# Patient Record
Sex: Male | Born: 1935 | Race: Black or African American | Hispanic: No | Smoking: Never smoker
Health system: Southern US, Community
[De-identification: ages and names within clinical notes are randomized; demographics above are authoritative.]

## PROBLEM LIST (undated history)

## (undated) DIAGNOSIS — I10 Essential (primary) hypertension: Secondary | ICD-10-CM

## (undated) DIAGNOSIS — I2510 Atherosclerotic heart disease of native coronary artery without angina pectoris: Secondary | ICD-10-CM

## (undated) DIAGNOSIS — E785 Hyperlipidemia, unspecified: Secondary | ICD-10-CM

## (undated) HISTORY — PX: OTHER SURGICAL HISTORY: SHX169

## (undated) HISTORY — PX: TONSILLECTOMY: SUR1361

## (undated) HISTORY — PX: CORONARY ANGIOPLASTY: SHX604

## (undated) HISTORY — PX: CARDIAC CATHETERIZATION: SHX172

---

## 2004-03-17 ENCOUNTER — Ambulatory Visit (HOSPITAL_COMMUNITY): Admission: RE | Admit: 2004-03-17 | Discharge: 2004-03-17 | Payer: Self-pay | Admitting: Family Medicine

## 2005-04-21 ENCOUNTER — Emergency Department (HOSPITAL_COMMUNITY): Admission: EM | Admit: 2005-04-21 | Discharge: 2005-04-21 | Payer: Self-pay | Admitting: Emergency Medicine

## 2007-10-02 ENCOUNTER — Ambulatory Visit (HOSPITAL_COMMUNITY): Admission: RE | Admit: 2007-10-02 | Discharge: 2007-10-02 | Payer: Self-pay | Admitting: General Surgery

## 2007-10-02 ENCOUNTER — Encounter (INDEPENDENT_AMBULATORY_CARE_PROVIDER_SITE_OTHER): Payer: Self-pay | Admitting: General Surgery

## 2007-10-03 ENCOUNTER — Inpatient Hospital Stay (HOSPITAL_COMMUNITY): Admission: RE | Admit: 2007-10-03 | Discharge: 2007-10-07 | Payer: Self-pay | Admitting: General Surgery

## 2007-10-03 ENCOUNTER — Encounter (INDEPENDENT_AMBULATORY_CARE_PROVIDER_SITE_OTHER): Payer: Self-pay | Admitting: General Surgery

## 2007-10-18 ENCOUNTER — Inpatient Hospital Stay (HOSPITAL_COMMUNITY): Admission: EM | Admit: 2007-10-18 | Discharge: 2007-10-23 | Payer: Self-pay | Admitting: Emergency Medicine

## 2008-02-13 ENCOUNTER — Emergency Department (HOSPITAL_COMMUNITY): Admission: EM | Admit: 2008-02-13 | Discharge: 2008-02-13 | Payer: Self-pay | Admitting: Emergency Medicine

## 2010-04-01 ENCOUNTER — Emergency Department (HOSPITAL_COMMUNITY): Admission: EM | Admit: 2010-04-01 | Discharge: 2010-04-01 | Payer: Self-pay | Admitting: Emergency Medicine

## 2010-08-03 ENCOUNTER — Emergency Department (HOSPITAL_COMMUNITY)
Admission: EM | Admit: 2010-08-03 | Discharge: 2010-08-03 | Payer: Self-pay | Source: Home / Self Care | Admitting: Emergency Medicine

## 2010-08-03 ENCOUNTER — Emergency Department (HOSPITAL_COMMUNITY)
Admission: EM | Admit: 2010-08-03 | Discharge: 2010-08-04 | Payer: Self-pay | Source: Home / Self Care | Admitting: Emergency Medicine

## 2010-08-04 LAB — URINALYSIS, ROUTINE W REFLEX MICROSCOPIC
Ketones, ur: NEGATIVE mg/dL
Leukocytes, UA: NEGATIVE
Nitrite: NEGATIVE
Protein, ur: 30 mg/dL — AB
Urobilinogen, UA: 0.2 mg/dL (ref 0.0–1.0)
pH: 6 (ref 5.0–8.0)

## 2010-08-04 LAB — BASIC METABOLIC PANEL
BUN: 29 mg/dL — ABNORMAL HIGH (ref 6–23)
Calcium: 9.1 mg/dL (ref 8.4–10.5)
GFR calc non Af Amer: 39 mL/min — ABNORMAL LOW (ref >60)
Potassium: 3.4 mEq/L — ABNORMAL LOW (ref 3.5–5.1)

## 2010-08-04 LAB — CBC
MCHC: 36.5 g/dL — ABNORMAL HIGH (ref 30.0–36.0)
MCV: 88.9 fL (ref 78.0–100.0)
Platelets: 159 10*3/uL (ref 150–400)
RDW: 13.8 % (ref 11.5–15.5)
WBC: 5.5 10*3/uL (ref 4.0–10.5)

## 2010-11-23 NOTE — H&P (Signed)
NAME:  Samuel Wilkerson, Samuel Wilkerson                ACCOUNT NO.:  [id number]   MEDICAL RECORD NO.:  [id number]           PATIENT TYPE:  AMB   LOCATION:  DAY                           FACILITY:  [facility]   PHYSICIAN:  Dalia Heading, M.D.  DATE OF BIRTH:  Apr 21, 1931   DATE OF ADMISSION:  DATE OF DISCHARGE:  LH                              HISTORY & PHYSICAL   CHIEF COMPLAINT:  Need for screening colonoscopy.   HISTORY OF PRESENT ILLNESS:  Patient is a 77 year old black male who was  referred for endoscopic evaluation.  He needs a colonoscopy for  screening purposes.  No abdominal pain, weight loss, nausea, vomiting,  diarrhea, constipation, melena, hematochezia.  He has never had a  colonoscopy.  There is no family history of colon carcinoma.   PAST MEDICAL HISTORY:  Includes:  1. Hypertension.  2. High cholesterol levels.   PAST SURGICAL HISTORY:  Unremarkable.   CURRENT MEDICATIONS:  1. Zetia.  2. Lisinopril.  3. Nifedipine.  4. Potassium supplements.  5. Librium.  6. Metoprolol.  7. Hydrochlorothiazide.  8. Zocor.  9. Aspirin which he is holding.   ALLERGIES:  NO KNOWN DRUG ALLERGIES.   REVIEW OF SYSTEMS:  Noncontributory.   PHYSICAL EXAMINATION:  Patient is a well-developed, well-nourished black  male in no acute distress.  LUNGS:  Clear to auscultation with equal breath sounds bilaterally.  HEART:  Examination reveals a regular rate and rhythm without S3, S4, or  murmurs.  ABDOMEN:  Soft, nontender, nondistended.  No hepatosplenomegaly or  masses are noted.  RECTAL:  Examination was deferred to the procedure.   IMPRESSION:  Need for screening colonoscopy.   PLAN:  The patient is scheduled for a colonoscopy on 10/02/2007.  The  risks and benefits of the procedure including bleeding and perforation  were fully explained to the patient, gave informed consent.      Dalia Heading, M.D.  Electronically Signed     MAJ/MEDQ  D:  09/27/2007  T:  09/27/2007  Job:  [id number]   cc:    Dalia Heading, M.D.  Fax: [phone]   Mila Homer. Sudie Bailey, M.D.  Fax: [phone]

## 2010-11-23 NOTE — H&P (Signed)
NAME:  Samuel Wilkerson, Samuel Wilkerson                ACCOUNT NO.:  [id number]   MEDICAL RECORD NO.:  [id number]          PATIENT TYPE:  INP   LOCATION:  A219                          FACILITY:  [facility]   PHYSICIAN:  Tilford Pillar, MD      DATE OF BIRTH:  02-24-1931   DATE OF ADMISSION:  10/18/2007  DATE OF DISCHARGE:  LH                              HISTORY & PHYSICAL   CHIEF COMPLAINT:  Bloody bowel movements, abdominal pain, and diarrhea.   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  Lovell Sheehan, who presented to the [facility] Emergency Department with one  episode of bright red blood per rectum.  The patient states that over  the last 1-2 weeks he had noted increasing loose stools.  Over the last  few days, he noticed frank diarrhea with watery bowel movements with  limited solid material.  This had continued on multiple episodes until  last night when he noticed bright red blood in the toilet.  He had no  nausea or vomiting.  No fevers or chills.  No acute abdominal pain.  He  did say he felt somewhat lightheaded upon sitting on the commode, but  did not lose consciousness and did not have any additional sensations of  vertigo or lightheadedness.  He denies any shortness of breath.  He  denied any chest pain.   PAST MEDICAL HISTORY:  1. Hypertension.  2. Hyperlipidemia.   PAST SURGICAL HISTORY:  Right hemicolectomy (postop 2 weeks).   MEDICATIONS:  1. Darvocet-N 100.  2. Zetia.  3. Lisinopril.  4. Nifedipine.  5. K-Dur.  6. Librium.  7. Metoprolol.  8. Hydrochlorothiazide.  9. Aspirin.  10.Zocor.   ALLERGIES:  NO KNOWN DRUG ALLERGIES.   SOCIAL HISTORY:  No tobacco use.  No alcohol use.  No recreational drug  use.   PHYSICAL EXAMINATION:  VITAL SIGNS:  Temperature 98.1, heart rate 80,  respirations 16, blood pressure 92/66.  He  has 96% oxygen saturation on  room air.  GENERAL:  Currently the patient is sitting in an upright position in the  hospital bed.  He is not in any acute distress.  He is alert and  oriented x3.  HEENT:  Pupils are equal, round, and reactive.  Pupils are somewhat  dilated, but symmetrical bilaterally.  Extraocular movements are intact.  Oral mucosa pink and moist.  Trachea is midline.  No cervical  lymphadenopathy is appreciated.  PULMONARY:  Unlabored respirations.  He is clear to auscultation  bilaterally.  CARDIOVASCULAR:  Regular rate and rhythm.  No murmurs appreciated on  evaluation.  He has 2+ radial pulse and dorsalis pedis pulses  bilaterally.  ABDOMEN:  Soft, flat, nondistended, and nontender.  He has a couple of  Steri-Strips remaining on his abdominal wall.  His incisions are well  healed.  No erythema and no masses are appreciated.   PERTINENT LABORATORY AND RADIOGRAPHIC STUDIES:  CBC:  White blood cell  count of 11.5, hemoglobin 9.6, hematocrit 27.6, and platelets 507.  Basic metabolic panel sodium 131, potassium 3.8, chloride 99, bicarb 27,  BUN 27, creatinine 1.77, blood glucose 142.  An acute abdominal series  ordered in [facility] Emergency Department which demonstrated no  evidence of free air, had a nonspecific gas pattern.   ASSESSMENT AND PLAN:  1. Dehydration and diarrhea.  2. Rectal bleeding.  At this point, we will plan on continuing IV      fluid resuscitation as the patient has not had any additional      bloody bowel movements.  He will be continued to be treated with      conservative management with close monitoring.  At this point, I      have a low suspicion that this is likely related to his recent      operation.  I have a low suspicion that this was related to an      ischemic colitis given the patient's symptomatology and a distant      history of his operation.  At this point, we will continue to      monitor blood levels and minimize his  antihypertensive medications      to avoid having the patient be over treated and result in continued      hypotension.  Should he continue to have GI bleeding, additional      workup for localizing will be required, but again at this point we      will continue close monitoring.  The patient's questions and      concerns were addressed.  Additionally, with a history of diarrhea,      although I have a low suspicion of a Clostridium difficile colitis,      this will be evaluated with a C. diff toxin evaluation to ensure      his diarrhea is not a infectious etiology and more suspicious that      his diarrhea may be related to an occult ongoing GI bleeding      source.      Tilford Pillar, MD  Electronically Signed     BZ/MEDQ  D:  10/18/2007  T:  10/19/2007  Job:  [id number]

## 2010-11-23 NOTE — Consult Note (Signed)
NAME:  Spath, Avory                ACCOUNT NO.:  [id number]   MEDICAL RECORD NO.:  [id number]          PATIENT TYPE:  INP   LOCATION:  A312                          FACILITY:  [facility]   PHYSICIAN:  Mila Homer. Sudie Bailey, M.D.DATE OF BIRTH:  05-Apr-1931   DATE OF CONSULTATION:  10/04/2007  DATE OF DISCHARGE:                                 CONSULTATION   A 77 year old went for screening colonoscopy and was found by Dr.  Lovell Sheehan to have a large mass in his cecum and thus it was decided that  given the size of this and chance for obstruction in the future, the  safest course would be remove before there is an acute surgical problem.   H has been followed at our office for many years.  He has hypertension,  CAD, hypercholesterolemia as major medical problems.  He had rectal  bleeding last month and since he is ready for a total colonoscopy  anyway, evaluation of that was put off until he had his colonoscopy by  Dr. Lovell Sheehan.   The patient had a heart attack back in 1993 and he had an angioplasty at  that time.  No stent was placed and he has done well since then with  risk factor modification.   CURRENT MEDICATIONS:  1. Ecotrin enteric-coated 81 mg a day.  2. Chlordiazepoxide 10 mg t.i.d. maximum dose for anxiety.  3. HCTZ 50 mg daily.  4. Niacin 5 mg daily.  5. Metoprolol 50 mg b.i.d.  6. Lisinopril 20 mg half a tablet daily.  7. KCl 8 mEq daily.  8. Nifedipine CC 30 mg daily.  9. Ezetimibe 10 mg half a tablet daily.  10.Simvastatin 80 mg daily.   He had no problems with chlordiazepoxide and this drug has actually  worked for him better than anything else for chronic anxiety.  He had no  falls, no weakness, no dizziness and continues to do well on this  particular drug.   He is currently retired.  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  Had a skin graft on this from his  abdomen.   ALLERGIES:  He has no known allergies to medicines or  foods.   PHYSICAL EXAMINATION:  GENERAL:  Admission exam today showed a well-  developed, well-nourished 77 year old man.  There is no acute distress  even after surgery.  VITAL SIGNS:  Temperature is 97 degrees, pulse 88, respirations 20,  blood pressure 127/76.  NEUROLOGIC:  He was oriented and alert.  Sentence structure was intact.  Speech was normal.  HEENT:  Mucous membranes moist.  LYMPHADENOPATHY:  There are no anterior cervical nodes.  There is no  axillary supraclavicular adenopathy.  HEART:  The heart had an absolutely regular rhythm, rate of 80.  LUNGS:  Clear throughout, moving air well.  ABDOMEN:  The lower abdomen was bandaged after his Pfannenstiel incision  and two trocar entries into the abdominal cavity.  He had a scar in the  left upper quadrant consistent with a skin graft.  There is no edema of  the ankles.  LABORATORY DATA:  His admission hemoglobin was 14.2 and hematocrit 40.9  two days ago. Today's was 12.9, 36.6 with IV fluids (estimated blood  loss during surgery with 100 mL).  His white cell count is 9000 with 78%  neutrophils, 8 lymphs, 14 monos.  His BMP showed a glucose of 116,  estimated GFR of 58.  Magnesium and phosphorus within normal limits.  Albumin 2.8.  CEA 1.   ASSESSMENT:  1. Cecal mass, now status post hemicolectomy  2. Benign essential hypertension.  3. Coronary artery disease, status post angioplasty  4. Hypercholesterolemia.  5. Rectal bleeding, possibly secondary to cecal mass.  6. Chronic anxiety.   He currently is on IV fluids and clear liquids and he is on his p.o.  meds.  Continue these.  Follow up medically 1-2 days and if he is doing  well, just have surgical follow-up only.      Mila Homer. Sudie Bailey, M.D.  Electronically Signed     SDK/MEDQ  D:  10/04/2007  T:  10/04/2007  Job:  [id number]

## 2010-11-23 NOTE — Op Note (Signed)
NAME:  Samuel Wilkerson, Samuel Wilkerson                ACCOUNT NO.:  [id number]   MEDICAL RECORD NO.:  [id number]          PATIENT TYPE:  INP   LOCATION:  A312                          FACILITY:  [facility]   PHYSICIAN:  Dalia Heading, M.D.  DATE OF BIRTH:  30-Aug-1930   DATE OF PROCEDURE:  10/03/2007  DATE OF DISCHARGE:                               OPERATIVE REPORT   PREOPERATIVE DIAGNOSIS:  Cecal neoplasm, unspecified.   POSTOPERATIVE DIAGNOSIS:  Cecal neoplasm, unspecified.   PROCEDURE:  Laparoscopic hand-assisted right hemicolectomy.   SURGEON:  Dalia Heading, M.D.   ANESTHESIA:  General endotracheal.   INDICATIONS:  The patient is a 77 year old black male who presents with  a cecal neoplasm found on colonoscopy.  It could not be removed  endoscopically, thus the patient comes to the operating room for a  laparoscopic hand-assisted partial colectomy.  The risks and benefits of  the procedure including bleeding, infection, cardiopulmonary  difficulties, the possibly of a blood transfusion, and the possibility  an open procedure were fully explained to the patient who gave informed  consent.   PROCEDURE NOTE:  The patient was placed in the low lithotomy position  after induction of general endotracheal anesthesia.  The abdomen was  prepped and draped using the usual sterile technique with Betadine.  Surgical site confirmation was performed.   A low Pfannenstiel incision was made.  This was taken down to the  fascia.  The fascia was divided transversely without difficulty.  The  peritoneal cavity was then entered into along the midline.  A GelPort  was then inserted.  An additional 11-mm trocar was placed in the  supraumbilical region, and an 11-mm trocar was placed in the left lower  quadrant regions.  The abdomen was then insufflated to 16 mmHg pressure.  On inspection of the abdominal cavity, the liver was inspected and noted  to be within  normal limits.  The gallbladder was noted to be  within  normal limits.  A nasogastric tube could not be passed into the stomach  easily, thus this was aborted.  The right colon was mobilized along the  peritoneal reflection.  The dissection was taken around the hepatic  flexure, and a LigaSure was used to facilitate mobilization.  A GIA  stapler was placed across the terminal ileum.  The mesentery was then  divided using the LigaSure up to the proximal transverse colon.  This  was all then brought out through the Pfannenstiel incision, and a GIA  stapler was placed across the proximal transverse colon and fired.  The  specimen was then removed from the operative field.  A side-to-side  ileocolic anastomosis was then performed using a GIA 70 stapler.  The  enterotomy was closed using a TA-60 stapler.  The staple line was  bolstered using 3-0 silk sutures.  Omentum was placed over the  anastomotic site and secured using 3-0 silk sutures.  The mesenteric  defect was closed using 2-0 chromic gut running interrupted sutures.  The bowel was then returned into the abdominal cavity.  The abdomen was  insufflated.  The right  lower quadrant was copiously irrigated with  normal saline.  All operating room personnel then changed their gloves.  Surgicel and Gelfoam were placed into the hepatic flexure to control any  oozing.  The mesentery was inspected, and no abnormal bleeding was  noted.  All fluid and air were then evacuated from the abdominal cavity  prior to removal of the trocars and GelPorts.   The supraumbilical fascia was reapproximated using a 0 Vicryl  interrupted suture.  The lower abdominal peritoneum was closed using 0  chromic gut running sutures.  The fascia was closed using 0 Vicryl  interrupted sutures.  The subcutaneous layer was reapproximated using 3-  0 Vicryl interrupted sutures.  The skin was closed using staples.  Betadine ointment and dry sterile dressings were applied.   All tape and needle counts were correct at the  end of the procedure.  The patient was extubated in the operating room and went back to the  recovery room awake and in stable condition.   COMPLICATIONS:  None.   SPECIMEN:  Right colon.   ESTIMATED BLOOD LOSS:  Less than 100 mL.      Dalia Heading, M.D.  Electronically Signed     MAJ/MEDQ  D:  10/03/2007  T:  10/04/2007  Job:  [id number]   cc:   Mila Homer. Sudie Bailey, M.D.  Fax: [phone]

## 2010-11-23 NOTE — H&P (Signed)
NAME:  Samuel Wilkerson, Samuel Wilkerson                ACCOUNT NO.:  [id number]   MEDICAL RECORD NO.:  [id number]          PATIENT TYPE:  AMB   LOCATION:  DAY                           FACILITY:  [facility]   PHYSICIAN:  Dalia Heading, M.D.  DATE OF BIRTH:  12-08-1930   DATE OF ADMISSION:  DATE OF DISCHARGE:  LH                              HISTORY & PHYSICAL   CHIEF COMPLAINT:  Cecal neoplasm.   HISTORY OF PRESENT ILLNESS:  The patient is a 77 year old black male who  underwent a screening colonoscopy today and was found to have a cecal  mass.  Multiple biopsies were taken and sent to pathology for further  examination.  The neoplasm could not be safely removed endoscopically.  The patient has thus been referred for a partial colectomy.   PAST MEDICAL HISTORY:  1. Hypertension.  2. High cholesterol levels.   PAST SURGICAL HISTORY:  Unremarkable.   CURRENT MEDICATIONS:  Zetia, lisinopril, nifedipine, potassium  supplements, Librium, metoprolol, hydrochlorothiazide, Zocor, aspirin  which she is holding.   ALLERGIES:  NO KNOWN DRUG ALLERGIES.   REVIEW OF SYSTEMS:  The patient denies any recent MI, chest pain, CVA,  or diabetes mellitus.   PHYSICAL EXAMINATION:  GENERAL:  The patient is a well-developed, well-  nourished black male in no acute distress.  LUNGS:  Clear to auscultation with equal breath sounds bilaterally.  HEART:  Examination reveals regular rate and rhythm without history, S4,  or murmurs.  ABDOMEN:  Soft, nontender, nondistended.  No hepatosplenomegaly, masses,  hernias are identified.  RECTAL:  Examination was unremarkable.   IMPRESSION:  1. Cecal neoplasm.  2. Hypertension.  3. High cholesterol levels.   PLAN:  The patient is scheduled undergo laparoscopic hand-assisted  partial colectomy on October 03, 2007.  Risks and benefits of procedure  including bleeding, infection, cardiopulmonary difficulties, the  possibility of an open procedure were fully explained to the  patient,  gave informed consent.      Dalia Heading, M.D.  Electronically Signed     MAJ/MEDQ  D:  10/02/2007  T:  10/02/2007  Job:  [id number]   cc:   Mila Homer. Sudie Bailey, M.D.  Fax: [phone]

## 2010-11-23 NOTE — H&P (Signed)
NAME:  Samuel Wilkerson, Samuel Wilkerson                ACCOUNT NO.:  [id number]   MEDICAL RECORD NO.:  [id number]           PATIENT TYPE:  AMB   LOCATION:  DAY                           FACILITY:  [facility]   PHYSICIAN:  Dalia Heading, M.D.  DATE OF BIRTH:  Dec 25, 1930   DATE OF ADMISSION:  DATE OF DISCHARGE:  LH                              HISTORY & PHYSICAL   CHIEF COMPLAINT:  Need for screening colonoscopy.   HISTORY OF PRESENT ILLNESS:  Patient is a 77 year old black male who was  referred for endoscopic evaluation.  He needs a colonoscopy for  screening purposes.  No abdominal pain, weight loss, nausea, vomiting,  diarrhea, constipation, melena, hematochezia.  He has never had a  colonoscopy.  There is no family history of colon carcinoma.   PAST MEDICAL HISTORY:  Includes:  1. Hypertension.  2. High cholesterol levels.   PAST SURGICAL HISTORY:  Unremarkable.   CURRENT MEDICATIONS:  1. Zetia.  2. Lisinopril.  3. Nifedipine.  4. Potassium supplements.  5. Librium.  6. Metoprolol.  7. Hydrochlorothiazide.  8. Zocor.  9. Aspirin which he is holding.   ALLERGIES:  NO KNOWN DRUG ALLERGIES.   REVIEW OF SYSTEMS:  Noncontributory.   PHYSICAL EXAMINATION:  Patient is a well-developed, well-nourished black  male in no acute distress.  LUNGS:  Clear to auscultation with equal breath sounds bilaterally.  HEART:  Examination reveals a regular rate and rhythm without S3, S4, or  murmurs.  ABDOMEN:  Soft, nontender, nondistended.  No hepatosplenomegaly or  masses are noted.  RECTAL:  Examination was deferred to the procedure.   IMPRESSION:  Need for screening colonoscopy.   PLAN:  The patient is scheduled for a colonoscopy on 10/02/2007.  The  risks and benefits of the procedure including bleeding and perforation  were fully explained to the patient, gave informed consent.      Dalia Heading, M.D.     MAJ/MEDQ  D:  09/27/2007  T:  09/27/2007  Job:  [id number]   cc:   Dalia Heading,  M.D.  Fax: [phone]   Mila Homer. Sudie Bailey, M.D.  Fax: [phone]

## 2010-11-23 NOTE — Discharge Summary (Signed)
NAME:  Samuel Wilkerson, Samuel Wilkerson                ACCOUNT NO.:  [id number]   MEDICAL RECORD NO.:  [id number]          PATIENT TYPE:  INP   LOCATION:  A312                          FACILITY:  [facility]   PHYSICIAN:  Dalia Heading, M.D.  DATE OF BIRTH:  01-24-1931   DATE OF ADMISSION:  10/03/2007  DATE OF DISCHARGE:  03/29/2009LH                               DISCHARGE SUMMARY   HISTORY OF PRESENT ILLNESS AND HOSPITAL COURSE:  The patient is a 77-  year-old black male who underwent a screening colonoscopy and was found  to have a large villous adenoma within the cecum.  It could not be fully  removed.  Biopsy results revealed some dysplasia within this villous  adenoma, but no carcinoma was seen.  As the tumor could not be removed  endoscopically, the patient underwent a laparoscopic hand-assisted right  hemicolectomy.  This was performed on October 03, 2007.  He tolerated the  procedure well.  His postoperative course has been for the most part  unremarkable.  His diet was advanced without difficulty once his bowel  function returned.  He did have hypophosphatemia which was corrected  with K-Phos.  Final pathology revealed no evidence of malignancy.  The  patient is being discharged home on postoperative day #4 in good and  improving condition.   DISCHARGE INSTRUCTIONS:  The patient is to follow up with Dr. Franky Macho on October 11, 2007.   DISCHARGE MEDICATIONS:  1. Darvocet-N 100 1-2 tablets p.o. q.6h. p.r.n. pain.  2. Zetia 10 mg p.o. daily.  3. Lisinopril 10 mg p.o. daily.  4. Nifedipine 30 mg p.o. daily.  5. Potassium supplements 10 mEq p.o. daily.  6. Librium 10 mg p.o. t.i.d.  7. Metoprolol 50 mg p.o. daily.  8. Hydrochlorothiazide 50 mg p.o. daily.  9. Zocor 80 mg p.o. daily.  10.Baby aspirin 1 tablet p.o. daily.   PRINCIPAL DIAGNOSES:  1. Tubular adenoma, cecum.  2. Hypertension.  3. High cholesterol levels.  4. Hypophosphatemia, resolved.   PRINCIPAL PROCEDURE:  Laparoscopic  hand-assisted right hemicolectomy on  October 03, 2007.      Dalia Heading, M.D.  Electronically Signed     MAJ/MEDQ  D:  10/07/2007  T:  10/07/2007  Job:  [id number]   cc:   Mila Homer. Sudie Bailey, M.D.  Fax: [phone]

## 2010-11-26 NOTE — Discharge Summary (Signed)
NAME:  Samuel Wilkerson, Samuel Wilkerson                ACCOUNT NO.:  [id number]   MEDICAL RECORD NO.:  [id number]          PATIENT TYPE:  INP   LOCATION:  A219                          FACILITY:  [facility]   PHYSICIAN:  Tilford Pillar, MD      DATE OF BIRTH:  [birth year]   DATE OF ADMISSION:  10/18/2007  DATE OF DISCHARGE:  04/14/2009LH                               DISCHARGE SUMMARY   ADMISSION DIAGNOSES:  Rectal bleeding and dehydration.   DISCHARGE DIAGNOSES:  1. Resolution of rectal bleeding.  2. Hypertension.  3. Hyperlipidemia.   PROCEDURE:  None.   MAIN SURGEON:  Tilford Pillar, MD   DISPOSITION:  Home.   HISTORY AND PHYSICAL:  Please see the admission history and physical for  complete H and P.  The patient is a 77 year old male who presented with  bright red blood per rectum prior to admission.  He had recently had a  right hemicolectomy for a villous adenoma by my partner, Dr. Lovell Sheehan,  and had some episodes of diarrhea following this.  Since then, he had  not noticed any bleeding; however, did have a bright red bowel movement  the day prior to admission, at which point he also felt lightheaded and  presented for evaluation.  He was admitted on October 18, 2007, for  continued management and intervention.   HOSPITAL COURSE:  The patient was admitted as stated above.  He was  continued on IV fluid hydration for his dehydration, and he was also  subsequently given a blood transfusion, and  closely had his hemoglobins  monitoring.  During his initial stay, he did not have any initial bright  red blood; however, on subsequent hospital day, he did have some bright  red bleeding per rectum noted by the nursing staff.  Based on this, he  was advised that he undergo a bleeding scan.  This was conducted, but  fortunately was negative and following this, he did have a couple of  maroon stools, but no additional bright red blood per rectum.  His  hemoglobininitially stabilized.  He continued to  demonstrated no signs  of GI bleeding and at this point, any further bleeding was not noted.  It was discussed with the patient the potential for being discharged  home.  He had been advanced to a regular diet and was tolerating this  well.  It was instructed to the patient that should he develop any  additional bleeding that he will again need to be worked up, and with  additional time, he may actually benefit from a colonoscopy, but as he  has a recent anastomosis, it was advised it is probably not the time to  proceed aggressively unless bleeding should persist or worsen.  The  patient understands the risks and benefits and at this point, plans will  be made for discharge to home.   DISCHARGE INSTRUCTIONS:  The patient is instructed that he may resume a  normal diet.  He is to continue to increase his activity slowly.  He may  walk up steps.  He may shower and bathe at this point.  He should not  lift anything greater than 20 pounds for the next 2 weeks as he is still  a recent postop.  He is to return to see Dr. Lovell Sheehan on October 30, 2007.   DISCHARGE MEDICATIONS:  He is to resume all home medications.   He is also instructed that he is to call the office or present back to  the emergency department if he resumes any bleeding.      Tilford Pillar, MD  Electronically Signed     BZ/MEDQ  D:  11/02/2007  T:  11/03/2007  Job:  [id number]   cc:   Mila Homer. Sudie Bailey, M.D.  Fax: [phone]

## 2011-04-04 LAB — DIFFERENTIAL
Basophils Absolute: 0
Basophils Absolute: 0
Basophils Relative: 0
Basophils Relative: 0
Eosinophils Absolute: 0
Eosinophils Absolute: 0
Eosinophils Relative: 0
Lymphocytes Relative: 8 — ABNORMAL LOW
Lymphs Abs: 0.7
Monocytes Absolute: 1.3 — ABNORMAL HIGH
Monocytes Absolute: 1.6 — ABNORMAL HIGH
Monocytes Relative: 13 — ABNORMAL HIGH
Monocytes Relative: 16 — ABNORMAL HIGH
Neutro Abs: 7
Neutro Abs: 7
Neutro Abs: 7.4
Neutrophils Relative %: 78 — ABNORMAL HIGH
Neutrophils Relative %: 79 — ABNORMAL HIGH

## 2011-04-04 LAB — CBC
HCT: 34.9 — ABNORMAL LOW
Hemoglobin: 12.4 — ABNORMAL LOW
Hemoglobin: 12.9 — ABNORMAL LOW
MCHC: 34.7
MCHC: 35.2
MCHC: 35.4
MCV: 90.6
MCV: 90.8
MCV: 91.5
Platelets: 147 — ABNORMAL LOW
Platelets: 166
RBC: 3.85 — ABNORMAL LOW
RBC: 4.2 — ABNORMAL LOW
RDW: 13.8
RDW: 14.1
RDW: 14.3
WBC: 8.8

## 2011-04-04 LAB — COMPREHENSIVE METABOLIC PANEL
Albumin: 3.7
BUN: 12
Creatinine, Ser: 1.25
Glucose, Bld: 118 — ABNORMAL HIGH
Total Protein: 6.8

## 2011-04-04 LAB — BASIC METABOLIC PANEL
BUN: 6
BUN: 8
CO2: 28
CO2: 29
CO2: 31
Calcium: 8.1 — ABNORMAL LOW
Calcium: 8.1 — ABNORMAL LOW
Calcium: 8.5
Calcium: 8.6
Chloride: 91 — ABNORMAL LOW
Chloride: 94 — ABNORMAL LOW
Creatinine, Ser: 1.18
Creatinine, Ser: 1.43
Creatinine, Ser: 1.51 — ABNORMAL HIGH
GFR calc Af Amer: 58 — ABNORMAL LOW
GFR calc Af Amer: >60
GFR calc non Af Amer: 48 — ABNORMAL LOW
GFR calc non Af Amer: >60
Glucose, Bld: 114 — ABNORMAL HIGH
Glucose, Bld: 116 — ABNORMAL HIGH
Glucose, Bld: 120 — ABNORMAL HIGH
Sodium: 129 — ABNORMAL LOW
Sodium: 137

## 2011-04-04 LAB — MAGNESIUM
Magnesium: 1.6
Magnesium: 1.7

## 2011-04-04 LAB — CEA: CEA: 1

## 2011-04-04 LAB — CROSSMATCH
ABO/RH(D): O POS
Antibody Screen: NEGATIVE

## 2011-04-04 LAB — PHOSPHORUS
Phosphorus: 1.6 — ABNORMAL LOW
Phosphorus: 3.9

## 2011-04-04 LAB — ABO/RH: ABO/RH(D): O POS

## 2011-04-04 LAB — ALBUMIN: Albumin: 2.7 — ABNORMAL LOW

## 2011-04-05 LAB — SAMPLE TO BLOOD BANK

## 2011-04-05 LAB — CROSSMATCH: ABO/RH(D): O POS

## 2011-04-05 LAB — COMPREHENSIVE METABOLIC PANEL
ALT: 55 — ABNORMAL HIGH
AST: 32
CO2: 27
Calcium: 8.7
Creatinine, Ser: 1.77 — ABNORMAL HIGH
GFR calc Af Amer: 46 — ABNORMAL LOW
GFR calc non Af Amer: 38 — ABNORMAL LOW
Sodium: 131 — ABNORMAL LOW
Total Protein: 6.9

## 2011-04-05 LAB — HEMOGLOBIN AND HEMATOCRIT, BLOOD
HCT: 23.8 — ABNORMAL LOW
HCT: 28 — ABNORMAL LOW
HCT: 30.4 — ABNORMAL LOW
Hemoglobin: 10 — ABNORMAL LOW
Hemoglobin: 10.5 — ABNORMAL LOW
Hemoglobin: 7.4 — CL

## 2011-04-05 LAB — BASIC METABOLIC PANEL
CO2: 28
Chloride: 103
Creatinine, Ser: 1.17
GFR calc Af Amer: >60
Glucose, Bld: 128 — ABNORMAL HIGH

## 2011-04-05 LAB — DIFFERENTIAL
Lymphocytes Relative: 13
Lymphs Abs: 1.5
Monocytes Relative: 9
Neutrophils Relative %: 78 — ABNORMAL HIGH

## 2011-04-05 LAB — CLOSTRIDIUM DIFFICILE EIA: C difficile Toxins A+B, EIA: NEGATIVE

## 2011-04-05 LAB — CBC
MCHC: 35.4
MCV: 89.1
RBC: 3.53 — ABNORMAL LOW
RDW: 13.9

## 2011-04-05 LAB — PROTIME-INR
INR: 1.1
Prothrombin Time: 14.1

## 2011-04-05 LAB — PREPARE RBC (CROSSMATCH)

## 2011-04-08 LAB — BASIC METABOLIC PANEL
BUN: 16
Chloride: 99
Creatinine, Ser: 1.27
Glucose, Bld: 98
Potassium: 3.5

## 2011-04-08 LAB — CBC
HCT: 39.8
MCV: 89.9
Platelets: 177
RDW: 14.4

## 2011-04-08 LAB — DIFFERENTIAL
Basophils Absolute: 0
Basophils Relative: 0
Eosinophils Absolute: 0
Eosinophils Relative: 1
Neutrophils Relative %: 61

## 2011-05-09 ENCOUNTER — Emergency Department (HOSPITAL_COMMUNITY)
Admission: EM | Admit: 2011-05-09 | Discharge: 2011-05-09 | Disposition: A | Discharge status: Home / Self Care | Payer: Medicare Other | Attending: Emergency Medicine | Admitting: Emergency Medicine

## 2011-05-09 ENCOUNTER — Encounter: Payer: Self-pay | Admitting: *Deleted

## 2011-05-09 DIAGNOSIS — I10 Essential (primary) hypertension: Secondary | ICD-10-CM | POA: Insufficient documentation

## 2011-05-09 DIAGNOSIS — J329 Chronic sinusitis, unspecified: Secondary | ICD-10-CM | POA: Insufficient documentation

## 2011-05-09 DIAGNOSIS — I2510 Atherosclerotic heart disease of native coronary artery without angina pectoris: Secondary | ICD-10-CM | POA: Insufficient documentation

## 2011-05-09 HISTORY — DX: Essential (primary) hypertension: I10

## 2011-05-09 HISTORY — DX: Atherosclerotic heart disease of native coronary artery without angina pectoris: I25.10

## 2011-05-09 MED ORDER — PENICILLIN V POTASSIUM 500 MG PO TABS: 500.0000 mg | ORAL_TABLET | Freq: Three times a day (TID) | ORAL | Status: AC

## 2011-05-09 NOTE — ED Notes (Signed)
Pt states that he has sinus drainage and a bad taste in his mouth since last Monday. Denies pain or fever.

## 2011-05-09 NOTE — ED Provider Notes (Signed)
History     CSN: [id number] Arrival date & time: 05/09/2011  9:39 AM   First MD Initiated Contact with Patient 05/09/11 0945      Chief Complaint  Patient presents with  . Nasal Congestion    (Consider location/radiation/quality/duration/timing/severity/associated sxs/prior treatment) HPI Comments: Patient c/o nasal congestion and "bad taste in my mouth" for greater than one week.  States he had similar sx's last year and was treated for a sinus infection.  He denies sore throat, neck or jaw pain, fever, headache or vomiting.    Patient is a 77 y.o. male presenting with sinusitis. The history is provided by the patient.  Sinusitis  This is a new problem. The current episode started more than 1 week ago. The problem has not changed since onset.There has been no fever. The patient is experiencing no pain. Associated symptoms include congestion and sinus pressure. Pertinent negatives include no chills, no sweats, no ear pain, no sore throat, no swollen glands, no cough and no shortness of breath. Associated symptoms comments: halitosis. He has tried nothing for the symptoms. The treatment provided no relief.    Past Medical History  Diagnosis Date  . Hypertension   . Coronary artery disease     Past Surgical History  Procedure Date  . Cardiac catheterization   . Coronary angioplasty     History reviewed. No pertinent family history.  History  Substance Use Topics  . Smoking status: Never Smoker   . Smokeless tobacco: Not on file  . Alcohol Use: No      Review of Systems  Constitutional: Negative for fever, chills, activity change, appetite change and fatigue.  HENT: Positive for congestion and sinus pressure. Negative for ear pain, sore throat, facial swelling, drooling, trouble swallowing, neck pain, neck stiffness, dental problem and voice change.   Respiratory: Negative for cough, shortness of breath and wheezing.   Cardiovascular: Negative for chest pain and  palpitations.  Gastrointestinal: Negative for nausea, vomiting and abdominal pain.  Musculoskeletal: Negative for myalgias, back pain and arthralgias.  Skin: Negative for rash.  Neurological: Negative for dizziness, facial asymmetry, speech difficulty, weakness, numbness and headaches.  Hematological: Does not bruise/bleed easily.  All other systems reviewed and are negative.    Allergies  Review of patient's allergies indicates no known allergies.  Home Medications  No current outpatient prescriptions on file.  BP 129/78  Pulse 66  Temp(Src) 97.6 F (36.4 C) (Oral)  Resp 16  Ht 6\' 4"  (1.93 m)  Wt 196 lb (88.905 kg)  BMI 23.86 kg/m2  SpO2 98%  Physical Exam  Nursing note and vitals reviewed. Constitutional: He is oriented to person, place, and time. He appears well-developed and well-nourished. No distress.  HENT:  Head: Normocephalic and atraumatic. No trismus in the jaw.  Right Ear: Tympanic membrane normal. No tenderness. No mastoid tenderness. No hemotympanum.  Left Ear: Tympanic membrane normal. No tenderness. No mastoid tenderness. No hemotympanum.  Nose: Mucosal edema and rhinorrhea present. No sinus tenderness. No epistaxis. Right sinus exhibits maxillary sinus tenderness. Right sinus exhibits no frontal sinus tenderness. Left sinus exhibits maxillary sinus tenderness. Left sinus exhibits no frontal sinus tenderness.  Mouth/Throat: Uvula is midline, oropharynx is clear and moist and mucous membranes are normal. No oral lesions. No dental abscesses or uvula swelling.  Neck: Normal range of motion. Neck supple.  Cardiovascular: Normal rate, regular rhythm and normal heart sounds.   Pulmonary/Chest: Effort normal and breath sounds normal. No respiratory distress. He exhibits no tenderness.  Musculoskeletal: Normal range of motion. He exhibits no tenderness.  Lymphadenopathy:    He has no cervical adenopathy.  Neurological: He is alert and oriented to person, place, and  time. No cranial nerve deficit. He exhibits normal muscle tone. Coordination normal.  Skin: Skin is warm and dry.  Psychiatric: He has a normal mood and affect.    ED Course  Procedures (including critical care time)      MDM      10:18 AM patient is alert, NAD.  Vitals are stable.  Patient's only complaint is halitosis and nasal congestion.  He denies CP, dyspnea, jaw pain, numbness or weakness. Was seen here last year for same and reports that penicillin helped.  Patient / Family / Caregiver understand and agree with initial ED impression and plan with expectations set for ED visit.     Sherrika Weakland L. Anagha Loseke, PA 05/11/11 2240

## 2011-05-09 NOTE — ED Notes (Signed)
Pt alert and oriented x 3. Skin warm and dry. Color pink. Breath sounds clear and equal bilaterally. No c/o pain at this time. Pt ambulates with no difficulty.

## 2011-05-12 NOTE — ED Provider Notes (Signed)
Medical screening examination/treatment/procedure(s) were performed by non-physician practitioner and as supervising physician I was immediately available for consultation/collaboration.  Ethelda Chick, MD 05/12/11 [phone]

## 2011-07-05 ENCOUNTER — Encounter (HOSPITAL_COMMUNITY): Payer: Self-pay | Admitting: *Deleted

## 2011-07-05 ENCOUNTER — Emergency Department (HOSPITAL_COMMUNITY)
Admission: EM | Admit: 2011-07-05 | Discharge: 2011-07-05 | Disposition: A | Discharge status: Home / Self Care | Payer: Medicare Other | Attending: Emergency Medicine | Admitting: Emergency Medicine

## 2011-07-05 DIAGNOSIS — R110 Nausea: Secondary | ICD-10-CM | POA: Insufficient documentation

## 2011-07-05 DIAGNOSIS — H18419 Arcus senilis, unspecified eye: Secondary | ICD-10-CM | POA: Insufficient documentation

## 2011-07-05 DIAGNOSIS — Z79899 Other long term (current) drug therapy: Secondary | ICD-10-CM | POA: Insufficient documentation

## 2011-07-05 DIAGNOSIS — K5289 Other specified noninfective gastroenteritis and colitis: Secondary | ICD-10-CM | POA: Insufficient documentation

## 2011-07-05 DIAGNOSIS — R197 Diarrhea, unspecified: Secondary | ICD-10-CM | POA: Insufficient documentation

## 2011-07-05 DIAGNOSIS — I2510 Atherosclerotic heart disease of native coronary artery without angina pectoris: Secondary | ICD-10-CM | POA: Insufficient documentation

## 2011-07-05 DIAGNOSIS — K529 Noninfective gastroenteritis and colitis, unspecified: Secondary | ICD-10-CM

## 2011-07-05 DIAGNOSIS — I10 Essential (primary) hypertension: Secondary | ICD-10-CM | POA: Insufficient documentation

## 2011-07-05 MED ORDER — ONDANSETRON 4 MG PO TBDP
4.0000 mg | ORAL_TABLET | Freq: Once | ORAL | Status: AC
  Administered 2011-07-05: 4 mg via ORAL
  Filled 2011-07-05: qty 1

## 2011-07-05 MED ORDER — ONDANSETRON 4 MG PO TBDP: 4.0000 mg | ORAL_TABLET | Freq: Three times a day (TID) | ORAL | Status: AC | PRN

## 2011-07-05 NOTE — ED Provider Notes (Signed)
This chart was scribed for Shelda Jakes, MD by Wallis Mart. The patient was seen in room [facility] and the patient's care was started at 10:27 AM.   CSN: [id number]  Arrival date & time 07/05/11  4098   First MD Initiated Contact with Patient 07/05/11 [phone]      Chief Complaint  Patient presents with  . Diarrhea    (Consider location/radiation/quality/duration/timing/severity/associated sxs/prior treatment) Patient is a 77 y.o. male presenting with diarrhea. The history is provided by the patient.  Diarrhea The primary symptoms include nausea and diarrhea. Primary symptoms do not include fever or myalgias. The illness began today. The onset was sudden. The problem has been gradually worsening.  The illness does not include back pain.    10:27 AM Samuel Wilkerson is a 77 y.o. male who presents to the Emergency Department complaining of sudden onset, gradually worsening nausea and diarrhea onset today at 6 AM. Pt has had 4X diarrhea. Pt denies blood in stool.  Pt took  Imodium with some relief of sx.   Pt denies vomiting, abd. pain, fever, body aches, swelling in legs, sore throat, cough, congestion, back pain, rash, neck pain.  Pt denies sick contact currently in the home.     PCP: Dr Sudie Bailey Past Medical History  Diagnosis Date  . Hypertension   . Coronary artery disease     Past Surgical History  Procedure Date  . Cardiac catheterization   . Coronary angioplasty     No family history on file.  History  Substance Use Topics  . Smoking status: Never Smoker   . Smokeless tobacco: Not on file  . Alcohol Use: No      Review of Systems  Constitutional: Negative for fever.  HENT: Negative for congestion and neck pain.   Respiratory: Negative for cough.   Cardiovascular: Negative for chest pain and leg swelling.  Gastrointestinal: Positive for nausea and diarrhea.  Musculoskeletal: Negative for myalgias and back pain.  Neurological: Negative for headaches.     Allergies  Review of patient's allergies indicates no known allergies.  Home Medications   Current Outpatient Rx  Name Route Sig Dispense Refill  . AMLODIPINE BESYLATE 10 MG PO TABS Oral Take 5 mg by mouth daily.     . ASPIRIN EC 81 MG PO TBEC Oral Take 81 mg by mouth daily.      . CHLORDIAZEPOXIDE HCL 10 MG PO CAPS Oral Take 10 mg by mouth at bedtime. Takes daily.  Can increase to twice daily if needed.    Marland Kitchen HYDROCHLOROTHIAZIDE 50 MG PO TABS Oral Take 50 mg by mouth.      Marland Kitchen LISINOPRIL 20 MG PO TABS Oral Take 10 mg by mouth daily.     Marland Kitchen METOPROLOL SUCCINATE ER 50 MG PO TB24 Oral Take 50 mg by mouth 2 (two) times daily.      Marland Kitchen NIACIN 500 MG PO TABS Oral Take 500 mg by mouth at bedtime.     Marland Kitchen POLYETHYLENE GLYCOL 3350 PO PACK Oral Take 17 g by mouth daily.      Marland Kitchen POTASSIUM CHLORIDE 8 MEQ PO TBCR Oral Take 8 mEq by mouth daily.      Marland Kitchen ROSUVASTATIN CALCIUM 40 MG PO TABS Oral Take 20 mg by mouth daily.      . TRAVOPROST 0.004 % OP SOLN Both Eyes Place 1 drop into both eyes at bedtime.      Marland Kitchen NITROGLYCERIN 0.4 MG SL SUBL Sublingual Place 0.4 mg under the  tongue every 5 (five) minutes as needed. For chest pain     . ONDANSETRON 4 MG PO TBDP Oral Take 1 tablet (4 mg total) by mouth every 8 (eight) hours as needed for nausea. 10 tablet 0    BP 118/75  Pulse 81  Temp(Src) 98.2 F (36.8 C) (Oral)  Resp 18  Ht 6\' 4"  (1.93 m)  Wt 200 lb (90.719 kg)  BMI 24.34 kg/m2  SpO2 99%  Physical Exam  Nursing note and vitals reviewed. Constitutional: He is oriented to person, place, and time. He appears well-developed and well-nourished. No distress.  HENT:  Head: Normocephalic and atraumatic.  Mouth/Throat: Oropharynx is clear and moist.  Eyes: EOM are normal. Pupils are equal, round, and reactive to light.        Arcus senilis both eyes,   Neck: Normal range of motion. Neck supple. No tracheal deviation present.  Cardiovascular: Normal rate and regular rhythm.   No murmur  heard. Pulmonary/Chest: Effort normal and breath sounds normal. No respiratory distress.  Abdominal: Soft. Bowel sounds are normal. There is no tenderness.  Musculoskeletal: Normal range of motion. He exhibits no edema.  Neurological: He is alert and oriented to person, place, and time. No sensory deficit.  Skin: Skin is warm and dry.  Psychiatric: He has a normal mood and affect. His behavior is normal.    ED Course  Procedures (including critical care time) DIAGNOSTIC STUDIES: Oxygen Saturation is 100% on room air, normal by my interpretation.    COORDINATION OF CARE:     Labs Reviewed - No data to display No results found.   1. Gastroenteritis       MDM  Patient with complaint of diarrhea and nausea no vomiting but that sounds consistent with a viral gastroenteritis. Nontoxic in no acute distress abdomen is soft and nontender. Treated with Zofran in the emergency department improved. Patient already taking Imodium.  I personally performed the services described in this documentation, which was scribed in my presence. The recorded information has been reviewed and considered.         Shelda Jakes, MD 07/05/11 [phone]

## 2011-07-05 NOTE — ED Notes (Signed)
Pt states nausea and diarrhea x 3 hours. States ~ 4 episodes of diarrhea. Pt took one immodium at [employer]

## 2011-08-25 ENCOUNTER — Encounter (HOSPITAL_COMMUNITY): Payer: Self-pay | Admitting: *Deleted

## 2011-08-25 ENCOUNTER — Emergency Department (HOSPITAL_COMMUNITY)
Admission: EM | Admit: 2011-08-25 | Discharge: 2011-08-25 | Disposition: A | Discharge status: Home / Self Care | Payer: Medicare Other | Attending: Emergency Medicine | Admitting: Emergency Medicine

## 2011-08-25 DIAGNOSIS — I2510 Atherosclerotic heart disease of native coronary artery without angina pectoris: Secondary | ICD-10-CM | POA: Insufficient documentation

## 2011-08-25 DIAGNOSIS — E785 Hyperlipidemia, unspecified: Secondary | ICD-10-CM | POA: Insufficient documentation

## 2011-08-25 DIAGNOSIS — I10 Essential (primary) hypertension: Secondary | ICD-10-CM | POA: Insufficient documentation

## 2011-08-25 DIAGNOSIS — J019 Acute sinusitis, unspecified: Secondary | ICD-10-CM | POA: Insufficient documentation

## 2011-08-25 HISTORY — DX: Hyperlipidemia, unspecified: E78.5

## 2011-08-25 MED ORDER — AMOXICILLIN 250 MG PO CAPS
500.0000 mg | ORAL_CAPSULE | Freq: Once | ORAL | Status: AC
  Administered 2011-08-25: 500 mg via ORAL
  Filled 2011-08-25: qty 2

## 2011-08-25 MED ORDER — AMOXICILLIN 500 MG PO CAPS: 500.0000 mg | ORAL_CAPSULE | Freq: Three times a day (TID) | ORAL | Status: AC

## 2011-08-25 NOTE — Discharge Instructions (Signed)

## 2011-08-25 NOTE — ED Notes (Signed)
Pt reports sore throat, feels like it is getting worse. Pt seen at the [place] & was prescribed Guaifensin

## 2011-08-25 NOTE — ED Provider Notes (Signed)
History     CSN: [id number]  Arrival date & time 08/25/11  0048   First MD Initiated Contact with Patient 08/25/11 0144      Chief Complaint  Patient presents with  . Sore Throat    (Consider location/radiation/quality/duration/timing/severity/associated sxs/prior treatment) HPI Comments: Was seen at [facility] hospital several days ago.  Was told to take cough medicine.  No better.  Patient is a 78 y.o. male presenting with URI. The history is provided by the patient.  URI The primary symptoms include sore throat and cough. Primary symptoms do not include fever. Primary symptoms comment: sinus pain The current episode started 6 to 7 days ago. This is a new problem. The problem has been gradually worsening.  Symptoms associated with the illness include chills, facial pain, sinus pressure and congestion.    Past Medical History  Diagnosis Date  . Hypertension   . Coronary artery disease   . Hyperlipidemia     Past Surgical History  Procedure Date  . Cardiac catheterization   . Coronary angioplasty     History reviewed. No pertinent family history.  History  Substance Use Topics  . Smoking status: Never Smoker   . Smokeless tobacco: Not on file  . Alcohol Use: No      Review of Systems  Constitutional: Positive for chills. Negative for fever.  HENT: Positive for congestion, sore throat and sinus pressure.   Respiratory: Positive for cough.   All other systems reviewed and are negative.    Allergies  Review of patient's allergies indicates no known allergies.  Home Medications   Current Outpatient Rx  Name Route Sig Dispense Refill  . AMLODIPINE BESYLATE 10 MG PO TABS Oral Take 5 mg by mouth daily.     . ASPIRIN EC 81 MG PO TBEC Oral Take 81 mg by mouth daily.      . CHLORDIAZEPOXIDE HCL 10 MG PO CAPS Oral Take 10 mg by mouth at bedtime. Takes daily.  Can increase to twice daily if needed.    Marland Kitchen HYDROCHLOROTHIAZIDE 50 MG PO TABS Oral Take 50 mg by mouth.      Marland Kitchen  LISINOPRIL 20 MG PO TABS Oral Take 10 mg by mouth daily.     Marland Kitchen METOPROLOL SUCCINATE ER 50 MG PO TB24 Oral Take 50 mg by mouth 2 (two) times daily.      Marland Kitchen NIACIN 500 MG PO TABS Oral Take 500 mg by mouth at bedtime.     Marland Kitchen NITROGLYCERIN 0.4 MG SL SUBL Sublingual Place 0.4 mg under the tongue every 5 (five) minutes as needed. For chest pain     . POLYETHYLENE GLYCOL 3350 PO PACK Oral Take 17 g by mouth daily.      Marland Kitchen POTASSIUM CHLORIDE 8 MEQ PO TBCR Oral Take 8 mEq by mouth daily.      Marland Kitchen ROSUVASTATIN CALCIUM 40 MG PO TABS Oral Take 20 mg by mouth daily.      . TRAVOPROST 0.004 % OP SOLN Both Eyes Place 1 drop into both eyes at bedtime.        BP 140/84  Pulse 76  Temp(Src) 97.8 F (36.6 C) (Oral)  Resp 18  Ht 6\' 4"  (1.93 m)  Wt 191 lb (86.637 kg)  BMI 23.25 kg/m2  SpO2 100%  Physical Exam  Nursing note and vitals reviewed. Constitutional: He is oriented to person, place, and time. He appears well-developed and well-nourished.  HENT:  Head: Normocephalic and atraumatic.  Right Ear: External ear normal.  Left Ear: External ear normal.  Mouth/Throat: Oropharynx is clear and moist. No oropharyngeal exudate.       Sinuses are ttp.  Neck: Normal range of motion. Neck supple.  Cardiovascular: Normal rate and regular rhythm.   No murmur heard. Pulmonary/Chest: Effort normal and breath sounds normal. No respiratory distress.  Musculoskeletal: Normal range of motion.  Lymphadenopathy:    He has no cervical adenopathy.  Neurological: He is alert and oriented to person, place, and time.  Skin: Skin is warm and dry. He is not diaphoretic.    ED Course  Procedures (including critical care time)  Labs Reviewed - No data to display No results found.   No diagnosis found.    MDM          Geoffery Lyons, MD 08/25/11 [phone]

## 2011-12-16 ENCOUNTER — Encounter (HOSPITAL_COMMUNITY): Payer: Self-pay | Admitting: *Deleted

## 2011-12-16 ENCOUNTER — Emergency Department (HOSPITAL_COMMUNITY)
Admission: EM | Admit: 2011-12-16 | Discharge: 2011-12-16 | Disposition: A | Discharge status: Home / Self Care | Payer: Medicare Other | Attending: Emergency Medicine | Admitting: Emergency Medicine

## 2011-12-16 DIAGNOSIS — I10 Essential (primary) hypertension: Secondary | ICD-10-CM | POA: Insufficient documentation

## 2011-12-16 DIAGNOSIS — J329 Chronic sinusitis, unspecified: Secondary | ICD-10-CM | POA: Insufficient documentation

## 2011-12-16 DIAGNOSIS — R197 Diarrhea, unspecified: Secondary | ICD-10-CM

## 2011-12-16 DIAGNOSIS — Z79899 Other long term (current) drug therapy: Secondary | ICD-10-CM | POA: Insufficient documentation

## 2011-12-16 DIAGNOSIS — I2510 Atherosclerotic heart disease of native coronary artery without angina pectoris: Secondary | ICD-10-CM | POA: Insufficient documentation

## 2011-12-16 DIAGNOSIS — E785 Hyperlipidemia, unspecified: Secondary | ICD-10-CM | POA: Insufficient documentation

## 2011-12-16 MED ORDER — AMOXICILLIN 500 MG PO CAPS: 500.0000 mg | ORAL_CAPSULE | Freq: Three times a day (TID) | ORAL | Status: AC

## 2011-12-16 MED ORDER — LOPERAMIDE HCL 2 MG PO CAPS: 2.0000 mg | ORAL_CAPSULE | Freq: Four times a day (QID) | ORAL | Status: AC | PRN

## 2011-12-16 NOTE — Discharge Instructions (Signed)
Take Imodium right ear for the diarrhea.return for any new or worse symptoms to include blood in the bowel movements worse abdominal pain. For the sinusitis take amoxicillin as directed. Return for any new or worse symptoms with that check lead to include fever. For severe headache. Followup of with your primary care Dr. In the next few days if not better.

## 2011-12-16 NOTE — ED Provider Notes (Signed)
History     CSN: [id number]  Arrival date & time 12/16/11  4098   First MD Initiated Contact with Patient 12/16/11 0539      Chief Complaint  Patient presents with  . Diarrhea  . URI    (Consider location/radiation/quality/duration/timing/severity/associated sxs/prior treatment) Patient is a 78 y.o. male presenting with diarrhea and URI. The history is provided by the patient.  Diarrhea The primary symptoms include diarrhea. Primary symptoms do not include fever, fatigue, abdominal pain, nausea, vomiting, melena, hematemesis, jaundice, hematochezia, dysuria, myalgias or rash. The illness began today.  The illness does not include chills or back pain.  URI Primary symptoms do not include fever, fatigue, headaches, abdominal pain, nausea, vomiting, myalgias or rash.  Symptoms associated with the illness include sinus pressure. The illness is not associated with chills or congestion.   Patient with 2 complaints. Predominantly diarrhea that started just today around midnight had 3 loose, Ms. No blood no sniffing abdominal pain no nausea no vomiting. The other complaint is sinus pressure has been present for 3-4 days patient's had a history of sinusitis problems in the past. Sinus pressure is rated about a 6/10 nonradiating is mostly in the maxillary area. No fever no headache no neck stiffness. Past Medical History  Diagnosis Date  . Hypertension   . Coronary artery disease   . Hyperlipidemia     Past Surgical History  Procedure Date  . Cardiac catheterization   . Coronary angioplasty     History reviewed. No pertinent family history.  History  Substance Use Topics  . Smoking status: Never Smoker   . Smokeless tobacco: Not on file  . Alcohol Use: No      Review of Systems  Constitutional: Negative for fever, chills and fatigue.  HENT: Positive for sinus pressure. Negative for congestion and trouble swallowing.   Eyes: Negative for photophobia, redness and visual  disturbance.  Gastrointestinal: Positive for diarrhea. Negative for nausea, vomiting, abdominal pain, melena, hematochezia, hematemesis and jaundice.  Genitourinary: Negative for dysuria.  Musculoskeletal: Negative for myalgias and back pain.  Skin: Negative for rash.  Neurological: Negative for headaches.  Hematological: Does not bruise/bleed easily.    Allergies  Review of patient's allergies indicates no known allergies.  Home Medications   Current Outpatient Rx  Name Route Sig Dispense Refill  . AMLODIPINE BESYLATE 10 MG PO TABS Oral Take 5 mg by mouth daily.     . ASPIRIN EC 81 MG PO TBEC Oral Take 81 mg by mouth daily.      . CHLORDIAZEPOXIDE HCL 10 MG PO CAPS Oral Take 10 mg by mouth at bedtime. Takes daily.  Can increase to twice daily if needed.    Marland Kitchen HYDROCHLOROTHIAZIDE 50 MG PO TABS Oral Take 50 mg by mouth.      Marland Kitchen LISINOPRIL 20 MG PO TABS Oral Take 10 mg by mouth daily.     Marland Kitchen METOPROLOL SUCCINATE ER 50 MG PO TB24 Oral Take 50 mg by mouth 2 (two) times daily.      Marland Kitchen NIACIN 500 MG PO TABS Oral Take 500 mg by mouth at bedtime.     Marland Kitchen POTASSIUM CHLORIDE 8 MEQ PO TBCR Oral Take 8 mEq by mouth daily.      Marland Kitchen ROSUVASTATIN CALCIUM 40 MG PO TABS Oral Take 20 mg by mouth daily.      . TRAVOPROST 0.004 % OP SOLN Both Eyes Place 1 drop into both eyes at bedtime.      . AMOXICILLIN 500  MG PO CAPS Oral Take 1 capsule (500 mg total) by mouth 3 (three) times daily. 21 capsule 0  . LOPERAMIDE HCL 2 MG PO CAPS Oral Take 1 capsule (2 mg total) by mouth 4 (four) times daily as needed for diarrhea or loose stools. 12 capsule 0  . NITROGLYCERIN 0.4 MG SL SUBL Sublingual Place 0.4 mg under the tongue every 5 (five) minutes as needed. For chest pain     . POLYETHYLENE GLYCOL 3350 PO PACK Oral Take 17 g by mouth daily.        BP 148/85  Pulse 79  Temp(Src) 98.7 F (37.1 C) (Oral)  Resp 20  SpO2 98%  Physical Exam  Nursing note and vitals reviewed. Constitutional: He is oriented to person,  place, and time. He appears well-developed and well-nourished.  HENT:  Head: Normocephalic and atraumatic.  Mouth/Throat: Oropharynx is clear and moist.       No tenderness over the frontal or maxillary sinuses.  Eyes: Conjunctivae and EOM are normal. Pupils are equal, round, and reactive to light.  Neck: Normal range of motion. Neck supple.  Cardiovascular: Normal rate.   No murmur heard. Pulmonary/Chest: Effort normal and breath sounds normal.  Abdominal: Soft. Bowel sounds are normal. There is no tenderness.  Musculoskeletal: He exhibits no edema and no tenderness.  Neurological: He is alert and oriented to person, place, and time. No cranial nerve deficit. He exhibits normal muscle tone. Coordination normal.  Skin: Skin is warm. No rash noted.    ED Course  Procedures (including critical care time)  Labs Reviewed - No data to display No results found.   1. Sinusitis   2. Diarrhea       MDM   Patient with complaint of sinus congestion and sinus pressure for 3-4 days and then today with 3-4 loose bowel movements no vomiting no nausea no bowel pain no blood in the bowel movements. Patient has had trouble with diarrhea in the past frequently shows up to the ED followed by Dr. Sudie Bailey. In ED patient is nontoxic no acute distress abdomen is soft no vomiting no diarrhea no emergent Hartmann no real sinus tenderness to palpation over the frontal or maxillary sinuses. However patient usually not satisfied and was treated with Imodium and an antibiotic. Will do so and have him followup with primary care Dr.       Shelda Jakes, MD 12/16/11 [phone]

## 2011-12-16 NOTE — ED Notes (Signed)
Discharge instructions reviewed with pt, questions answered. Pt verbalized understanding.  

## 2011-12-16 NOTE — ED Notes (Signed)
Pt stated diarrhea started around midnight, also having sinus pain for 3-4 days.

## 2012-02-10 ENCOUNTER — Emergency Department (HOSPITAL_COMMUNITY)
Admission: EM | Admit: 2012-02-10 | Discharge: 2012-02-10 | Disposition: A | Discharge status: Home / Self Care | Payer: Medicare Other | Attending: Emergency Medicine | Admitting: Emergency Medicine

## 2012-02-10 ENCOUNTER — Encounter (HOSPITAL_COMMUNITY): Payer: Self-pay

## 2012-02-10 DIAGNOSIS — I2510 Atherosclerotic heart disease of native coronary artery without angina pectoris: Secondary | ICD-10-CM | POA: Insufficient documentation

## 2012-02-10 DIAGNOSIS — Z79899 Other long term (current) drug therapy: Secondary | ICD-10-CM | POA: Insufficient documentation

## 2012-02-10 DIAGNOSIS — E785 Hyperlipidemia, unspecified: Secondary | ICD-10-CM | POA: Insufficient documentation

## 2012-02-10 DIAGNOSIS — Z7982 Long term (current) use of aspirin: Secondary | ICD-10-CM | POA: Insufficient documentation

## 2012-02-10 DIAGNOSIS — R0982 Postnasal drip: Secondary | ICD-10-CM | POA: Insufficient documentation

## 2012-02-10 DIAGNOSIS — I10 Essential (primary) hypertension: Secondary | ICD-10-CM | POA: Insufficient documentation

## 2012-02-10 MED ORDER — FLUTICASONE PROPIONATE 50 MCG/ACT NA SUSP: 2.0000 | Freq: Every day | NASAL | Status: DC

## 2012-02-10 NOTE — ED Provider Notes (Signed)
History     CSN: [id number]  Arrival date & time 02/10/12  0123   First MD Initiated Contact with Patient 02/10/12 0140      No chief complaint on file.   (Consider location/radiation/quality/duration/timing/severity/associated sxs/prior treatment) HPI Pt reports he has had several days of post-nasal drip. States it leaves a bad taste in his mouth, denies any sinus pain or pressure, no fever, no sore throat or cough. He has been seen in the ED several times in recent months for same, getting Abx Rx each time.   Past Medical History  Diagnosis Date  . Hypertension   . Coronary artery disease   . Hyperlipidemia     Past Surgical History  Procedure Date  . Cardiac catheterization   . Coronary angioplasty     No family history on file.  History  Substance Use Topics  . Smoking status: Never Smoker   . Smokeless tobacco: Not on file  . Alcohol Use: No      Review of Systems All other systems reviewed and are negative except as noted in HPI.   Allergies  Review of patient's allergies indicates no known allergies.  Home Medications   Current Outpatient Rx  Name Route Sig Dispense Refill  . AMLODIPINE BESYLATE 10 MG PO TABS Oral Take 5 mg by mouth daily.     . ASPIRIN EC 81 MG PO TBEC Oral Take 81 mg by mouth daily.      . CHLORDIAZEPOXIDE HCL 10 MG PO CAPS Oral Take 10 mg by mouth at bedtime. Takes daily.  Can increase to twice daily if needed.    Marland Kitchen HYDROCHLOROTHIAZIDE 50 MG PO TABS Oral Take 50 mg by mouth.      Marland Kitchen LISINOPRIL 20 MG PO TABS Oral Take 10 mg by mouth daily.     Marland Kitchen METOPROLOL SUCCINATE ER 50 MG PO TB24 Oral Take 50 mg by mouth 2 (two) times daily.      Marland Kitchen NIACIN 500 MG PO TABS Oral Take 500 mg by mouth at bedtime.     Marland Kitchen NITROGLYCERIN 0.4 MG SL SUBL Sublingual Place 0.4 mg under the tongue every 5 (five) minutes as needed. For chest pain     . POLYETHYLENE GLYCOL 3350 PO PACK Oral Take 17 g by mouth daily.      Marland Kitchen POTASSIUM CHLORIDE 8 MEQ PO TBCR Oral Take  8 mEq by mouth daily.      Marland Kitchen ROSUVASTATIN CALCIUM 40 MG PO TABS Oral Take 20 mg by mouth daily.      . TRAVOPROST 0.004 % OP SOLN Both Eyes Place 1 drop into both eyes at bedtime.        BP 140/87  Pulse 65  Temp 98.2 F (36.8 C) (Oral)  Resp 18  Ht 6\' 4"  (1.93 m)  Wt 195 lb (88.451 kg)  BMI 23.74 kg/m2  SpO2 99%  Physical Exam  Nursing note and vitals reviewed. Constitutional: He is oriented to person, place, and time. He appears well-developed and well-nourished.  HENT:  Head: Normocephalic and atraumatic.  Nose: No rhinorrhea or sinus tenderness. Right sinus exhibits no maxillary sinus tenderness and no frontal sinus tenderness. Left sinus exhibits no maxillary sinus tenderness and no frontal sinus tenderness.  Mouth/Throat: Oropharynx is clear and moist. No oropharyngeal exudate.  Eyes: EOM are normal. Pupils are equal, round, and reactive to light.  Neck: Normal range of motion. Neck supple.  Cardiovascular: Normal rate, normal heart sounds and intact distal pulses.   Pulmonary/Chest:  Effort normal and breath sounds normal.  Abdominal: Bowel sounds are normal. He exhibits no distension. There is no tenderness.  Musculoskeletal: Normal range of motion. He exhibits no edema and no tenderness.  Neurological: He is alert and oriented to person, place, and time. He has normal strength. No cranial nerve deficit or sensory deficit.  Skin: Skin is warm and dry. No rash noted.  Psychiatric: He has a normal mood and affect.    ED Course  Procedures (including critical care time)  Labs Reviewed - No data to display No results found.   No diagnosis found.    MDM  Pt has no signs or symptoms of sinusitis. He may have some mild nasal congestion and post-nasal drip but this is not apparent on exam tonight. Advised that Abx were not indicated. Will start Flonase, advised to use Corcidin for congestion.         Charles B. Bernette Mayers, MD 02/10/12 4098

## 2012-08-04 ENCOUNTER — Emergency Department (HOSPITAL_COMMUNITY)
Admission: EM | Admit: 2012-08-04 | Discharge: 2012-08-04 | Disposition: A | Discharge status: Home / Self Care | Payer: Medicare Other | Attending: Emergency Medicine | Admitting: Emergency Medicine

## 2012-08-04 ENCOUNTER — Encounter (HOSPITAL_COMMUNITY): Payer: Self-pay | Admitting: Emergency Medicine

## 2012-08-04 DIAGNOSIS — J329 Chronic sinusitis, unspecified: Secondary | ICD-10-CM | POA: Insufficient documentation

## 2012-08-04 DIAGNOSIS — I10 Essential (primary) hypertension: Secondary | ICD-10-CM | POA: Insufficient documentation

## 2012-08-04 DIAGNOSIS — Z79899 Other long term (current) drug therapy: Secondary | ICD-10-CM | POA: Insufficient documentation

## 2012-08-04 DIAGNOSIS — R05 Cough: Secondary | ICD-10-CM | POA: Insufficient documentation

## 2012-08-04 DIAGNOSIS — I2510 Atherosclerotic heart disease of native coronary artery without angina pectoris: Secondary | ICD-10-CM | POA: Insufficient documentation

## 2012-08-04 DIAGNOSIS — R059 Cough, unspecified: Secondary | ICD-10-CM | POA: Insufficient documentation

## 2012-08-04 DIAGNOSIS — R0982 Postnasal drip: Secondary | ICD-10-CM | POA: Insufficient documentation

## 2012-08-04 DIAGNOSIS — Z7982 Long term (current) use of aspirin: Secondary | ICD-10-CM | POA: Insufficient documentation

## 2012-08-04 DIAGNOSIS — E785 Hyperlipidemia, unspecified: Secondary | ICD-10-CM | POA: Insufficient documentation

## 2012-08-04 MED ORDER — PENICILLIN V POTASSIUM 500 MG PO TABS: 500.0000 mg | ORAL_TABLET | Freq: Three times a day (TID) | ORAL | Status: AC

## 2012-08-04 NOTE — ED Notes (Signed)
Pt c/o nasal congestion for past two days.  States he can "feel it dripping down my throat". Denies sore throat.

## 2012-08-04 NOTE — ED Provider Notes (Signed)
History     CSN: [id number]  Arrival date & time 08/04/12  6644   First MD Initiated Contact with Patient 08/04/12 [phone]      Chief Complaint  Patient presents with  . Nasal Congestion     The history is provided by the patient.  nasal congestion Onset - two days ago Course - worsening Improved by - sitting up Worsened by - lying down  Pt reports two days of cough, nasal congestion and post nasal drip.  No fever/vomiting. No SOB is reported.  He reports he gets this frequently.  He reports penicillin works for these symptoms  Past Medical History  Diagnosis Date  . Hypertension   . Coronary artery disease   . Hyperlipidemia     Past Surgical History  Procedure Date  . Cardiac catheterization   . Coronary angioplasty   . Tonsillectomy     No family history on file.  History  Substance Use Topics  . Smoking status: Never Smoker   . Smokeless tobacco: Not on file  . Alcohol Use: No      Review of Systems  Constitutional: Negative for fever.  Respiratory: Positive for cough.     Allergies  Review of patient's allergies indicates no known allergies.  Home Medications   Current Outpatient Rx  Name  Route  Sig  Dispense  Refill  . AMLODIPINE BESYLATE 10 MG PO TABS   Oral   Take 5 mg by mouth daily.          . ASPIRIN EC 81 MG PO TBEC   Oral   Take 81 mg by mouth daily.           . CHLORDIAZEPOXIDE HCL 10 MG PO CAPS   Oral   Take 10 mg by mouth at bedtime. Takes daily.  Can increase to twice daily if needed.         Marland Kitchen HYDROCHLOROTHIAZIDE 50 MG PO TABS   Oral   Take 50 mg by mouth.           Marland Kitchen LISINOPRIL 20 MG PO TABS   Oral   Take 10 mg by mouth daily.          Marland Kitchen METOPROLOL SUCCINATE ER 50 MG PO TB24   Oral   Take 50 mg by mouth 2 (two) times daily.           Marland Kitchen NIACIN 500 MG PO TABS   Oral   Take 500 mg by mouth at bedtime.          Marland Kitchen NITROGLYCERIN 0.4 MG SL SUBL   Sublingual   Place 0.4 mg under the tongue every 5 (five)  minutes as needed. For chest pain          . PENICILLIN V POTASSIUM 500 MG PO TABS   Oral   Take 1 tablet (500 mg total) by mouth 3 (three) times daily.   15 tablet   0   . POLYETHYLENE GLYCOL 3350 PO PACK   Oral   Take 17 g by mouth daily.           Marland Kitchen POTASSIUM CHLORIDE 8 MEQ PO TBCR   Oral   Take 8 mEq by mouth daily.           Marland Kitchen ROSUVASTATIN CALCIUM 40 MG PO TABS   Oral   Take 20 mg by mouth daily.           . TRAVOPROST 0.004 % OP SOLN   Both  Eyes   Place 1 drop into both eyes at bedtime.             BP 135/80  Pulse 89  Temp 99.2 F (37.3 C) (Oral)  Resp 18  Ht 6\' 4"  (1.93 m)  Wt 193 lb (87.544 kg)  BMI 23.49 kg/m2  SpO2 97%  Physical Exam CONSTITUTIONAL: Well developed/well nourished HEAD AND FACE: Normocephalic/atraumatic EYES: EOMI/PERRL ENMT: Mucous membranes moist, nasal congestion, uvula midline NECK: supple no meningeal signs CV: S1/S2 noted, no murmurs/rubs/gallops noted LUNGS: Lungs are clear to auscultation bilaterally, no apparent distress but he does cough frequently during exam ABDOMEN: soft, nontender, no rebound or guarding NEURO: Pt is awake/alert, moves all extremitiesx4 EXTREMITIES:  full ROM SKIN: warm, color normal PSYCH: no abnormalities of mood noted  ED Course  Procedures   1. Sinusitis    Pt with likely viral sinusitis.  He is in no distress and he is well appearing.  He requests penicillin as he reports "it always helps"  I encouraged him to hold the prescription to see if symptoms improve over the next 48 hours.  I advised use of flonase but he reports "it made my nose bleed once so I stopped."     MDM  Nursing notes including past medical history and social history reviewed and considered in documentation         Joya Gaskins, MD 08/04/12 [phone]

## 2013-05-05 ENCOUNTER — Encounter (HOSPITAL_COMMUNITY): Payer: Self-pay | Admitting: Emergency Medicine

## 2013-05-05 ENCOUNTER — Emergency Department (HOSPITAL_COMMUNITY)
Admission: EM | Admit: 2013-05-05 | Discharge: 2013-05-05 | Disposition: A | Discharge status: Home / Self Care | Payer: Medicare Other | Attending: Emergency Medicine | Admitting: Emergency Medicine

## 2013-05-05 DIAGNOSIS — T6391XA Toxic effect of contact with unspecified venomous animal, accidental (unintentional), initial encounter: Secondary | ICD-10-CM | POA: Insufficient documentation

## 2013-05-05 DIAGNOSIS — Y9301 Activity, walking, marching and hiking: Secondary | ICD-10-CM | POA: Insufficient documentation

## 2013-05-05 DIAGNOSIS — I2510 Atherosclerotic heart disease of native coronary artery without angina pectoris: Secondary | ICD-10-CM | POA: Insufficient documentation

## 2013-05-05 DIAGNOSIS — T63461A Toxic effect of venom of wasps, accidental (unintentional), initial encounter: Secondary | ICD-10-CM | POA: Insufficient documentation

## 2013-05-05 DIAGNOSIS — I10 Essential (primary) hypertension: Secondary | ICD-10-CM | POA: Insufficient documentation

## 2013-05-05 DIAGNOSIS — Z9861 Coronary angioplasty status: Secondary | ICD-10-CM | POA: Insufficient documentation

## 2013-05-05 DIAGNOSIS — Z9889 Other specified postprocedural states: Secondary | ICD-10-CM | POA: Insufficient documentation

## 2013-05-05 DIAGNOSIS — Y9289 Other specified places as the place of occurrence of the external cause: Secondary | ICD-10-CM | POA: Insufficient documentation

## 2013-05-05 DIAGNOSIS — Z79899 Other long term (current) drug therapy: Secondary | ICD-10-CM | POA: Insufficient documentation

## 2013-05-05 DIAGNOSIS — E785 Hyperlipidemia, unspecified: Secondary | ICD-10-CM | POA: Insufficient documentation

## 2013-05-05 NOTE — ED Provider Notes (Signed)
CSN: [id number]     Arrival date & time 05/05/13  0218 History   First MD Initiated Contact with Patient 05/05/13 0230     Chief Complaint  Patient presents with  . Insect Bite    The history is provided by the patient.  pt reports an insect sting to right upper arm about 1 hr ago He reports he was walking outside and felt a sting and he "brushed" off an insect He now reports mild pain and redness to the area No tongue/lip swelling.  No SOB.  No other rash reported His course is improving  Past Medical History  Diagnosis Date  . Hypertension   . Coronary artery disease   . Hyperlipidemia    Past Surgical History  Procedure Laterality Date  . Cardiac catheterization    . Coronary angioplasty    . Tonsillectomy     No family history on file. History  Substance Use Topics  . Smoking status: Never Smoker   . Smokeless tobacco: Not on file  . Alcohol Use: No    Review of Systems  Constitutional: Negative for fever.  Respiratory: Negative for shortness of breath.     Allergies  Review of patient's allergies indicates no known allergies.  Home Medications   Current Outpatient Rx  Name  Route  Sig  Dispense  Refill  . amLODipine (NORVASC) 10 MG tablet   Oral   Take 5 mg by mouth daily.          Marland Kitchen aspirin EC 81 MG tablet   Oral   Take 81 mg by mouth daily.           . chlordiazePOXIDE (LIBRIUM) 10 MG capsule   Oral   Take 10 mg by mouth at bedtime. Takes daily.  Can increase to twice daily if needed.         . hydrochlorothiazide (HYDRODIURIL) 50 MG tablet   Oral   Take 50 mg by mouth.           Marland Kitchen lisinopril (PRINIVIL,ZESTRIL) 20 MG tablet   Oral   Take 10 mg by mouth daily.          . metoprolol (TOPROL-XL) 50 MG 24 hr tablet   Oral   Take 50 mg by mouth 2 (two) times daily.           . niacin 500 MG tablet   Oral   Take 500 mg by mouth at bedtime.          . nitroGLYCERIN (NITROSTAT) 0.4 MG SL tablet   Sublingual   Place 0.4 mg under  the tongue every 5 (five) minutes as needed. For chest pain          . polyethylene glycol (MIRALAX / GLYCOLAX) packet   Oral   Take 17 g by mouth daily.           . potassium chloride (KLOR-CON) 8 MEQ CR tablet   Oral   Take 8 mEq by mouth daily.           . rosuvastatin (CRESTOR) 40 MG tablet   Oral   Take 20 mg by mouth daily.           . travoprost, benzalkonium, (TRAVATAN) 0.004 % ophthalmic solution   Both Eyes   Place 1 drop into both eyes at bedtime.            BP 144/82  Pulse 82  Temp(Src) 97.7 F (36.5 C) (Oral)  Resp 16  Ht 6\' 4"  (1.93 m)  Wt 190 lb (86.183 kg)  BMI 23.14 kg/m2  SpO2 100% Physical Exam CONSTITUTIONAL: Well developed/well nourished HEAD: Normocephalic/atraumatic EYES: EOMI/PERRL ENMT: Mucous membranes moist, no tongue or lip swelling is noted NECK: supple no meningeal signs CV: S1/S2 noted, no murmurs/rubs/gallops noted LUNGS: Lungs are clear to auscultation bilaterally, no apparent distress ABDOMEN: soft, nontender, no rebound or guarding NEURO: Pt is awake/alert, moves all extremitiesx4 EXTREMITIES: pulses normal, full ROM. ?small area of erythema to right medial upper arm.  No crepitance/erythematous streaking/abscess noted.  No urticaria noted SKIN: warm, color normal PSYCH: no abnormalities of mood noted  ED Course  Procedures (including critical care time) Labs Review Labs Reviewed - No data to display Imaging Review No results found.  EKG Interpretation   None       MDM   1. Insect sting, initial encounter    Nursing notes including past medical history and social history reviewed and considered in documentation     Joya Gaskins, MD 05/05/13 9604

## 2013-05-05 NOTE — ED Notes (Signed)
Felt a sting to upper right arm around 1am.

## 2013-10-07 ENCOUNTER — Emergency Department (HOSPITAL_COMMUNITY)
Admission: EM | Admit: 2013-10-07 | Discharge: 2013-10-07 | Disposition: A | Discharge status: Home / Self Care | Payer: Medicare Other | Attending: Emergency Medicine | Admitting: Emergency Medicine

## 2013-10-07 ENCOUNTER — Encounter (HOSPITAL_COMMUNITY): Payer: Self-pay | Admitting: Emergency Medicine

## 2013-10-07 DIAGNOSIS — J329 Chronic sinusitis, unspecified: Secondary | ICD-10-CM | POA: Insufficient documentation

## 2013-10-07 DIAGNOSIS — I10 Essential (primary) hypertension: Secondary | ICD-10-CM | POA: Insufficient documentation

## 2013-10-07 DIAGNOSIS — Z79899 Other long term (current) drug therapy: Secondary | ICD-10-CM | POA: Insufficient documentation

## 2013-10-07 DIAGNOSIS — Z9861 Coronary angioplasty status: Secondary | ICD-10-CM | POA: Insufficient documentation

## 2013-10-07 DIAGNOSIS — Z9889 Other specified postprocedural states: Secondary | ICD-10-CM | POA: Insufficient documentation

## 2013-10-07 DIAGNOSIS — E785 Hyperlipidemia, unspecified: Secondary | ICD-10-CM | POA: Insufficient documentation

## 2013-10-07 DIAGNOSIS — I2510 Atherosclerotic heart disease of native coronary artery without angina pectoris: Secondary | ICD-10-CM | POA: Insufficient documentation

## 2013-10-07 DIAGNOSIS — Z7982 Long term (current) use of aspirin: Secondary | ICD-10-CM | POA: Insufficient documentation

## 2013-10-07 MED ORDER — AMOXICILLIN 500 MG PO CAPS: 1000.0000 mg | ORAL_CAPSULE | Freq: Three times a day (TID) | ORAL | Status: DC

## 2013-10-07 NOTE — Discharge Instructions (Signed)
Sinusitis Sinusitis is redness, soreness, and swelling (inflammation) of the paranasal sinuses. Paranasal sinuses are air pockets within the bones of your face (beneath the eyes, the middle of the forehead, or above the eyes). In healthy paranasal sinuses, mucus is able to drain out, and air is able to circulate through them by way of your nose. However, when your paranasal sinuses are inflamed, mucus and air can become trapped. This can allow bacteria and other germs to grow and cause infection. Sinusitis can develop quickly and last only a short time (acute) or continue over a long period (chronic). Sinusitis that lasts for more than 12 weeks is considered chronic.  CAUSES  Causes of sinusitis include:  Allergies.  Structural abnormalities, such as displacement of the cartilage that separates your nostrils (deviated septum), which can decrease the air flow through your nose and sinuses and affect sinus drainage.  Functional abnormalities, such as when the small hairs (cilia) that line your sinuses and help remove mucus do not work properly or are not present. SYMPTOMS  Symptoms of acute and chronic sinusitis are the same. The primary symptoms are pain and pressure around the affected sinuses. Other symptoms include:  Upper toothache.  Earache.  Headache.  Bad breath.  Decreased sense of smell and taste.  A cough, which worsens when you are lying flat.  Fatigue.  Fever.  Thick drainage from your nose, which often is green and may contain pus (purulent).  Swelling and warmth over the affected sinuses. DIAGNOSIS  Your caregiver will perform a physical exam. During the exam, your caregiver may:  Look in your nose for signs of abnormal growths in your nostrils (nasal polyps).  Tap over the affected sinus to check for signs of infection.  View the inside of your sinuses (endoscopy) with a special imaging device with a light attached (endoscope), which is inserted into your  sinuses. If your caregiver suspects that you have chronic sinusitis, one or more of the following tests may be recommended:  Allergy tests.  Nasal culture A sample of mucus is taken from your nose and sent to a lab and screened for bacteria.  Nasal cytology A sample of mucus is taken from your nose and examined by your caregiver to determine if your sinusitis is related to an allergy. TREATMENT  Most cases of acute sinusitis are related to a viral infection and will resolve on their own within 10 days. Sometimes medicines are prescribed to help relieve symptoms (pain medicine, decongestants, nasal steroid sprays, or saline sprays).  However, for sinusitis related to a bacterial infection, your caregiver will prescribe antibiotic medicines. These are medicines that will help kill the bacteria causing the infection.  Rarely, sinusitis is caused by a fungal infection. In theses cases, your caregiver will prescribe antifungal medicine. For some cases of chronic sinusitis, surgery is needed. Generally, these are cases in which sinusitis recurs more than 3 times per year, despite other treatments. HOME CARE INSTRUCTIONS   Drink plenty of water. Water helps thin the mucus so your sinuses can drain more easily.  Use a humidifier.  Inhale steam 3 to 4 times a day (for example, sit in the bathroom with the shower running).  Apply a warm, moist washcloth to your face 3 to 4 times a day, or as directed by your caregiver.  Use saline nasal sprays to help moisten and clean your sinuses.  Take over-the-counter or prescription medicines for pain, discomfort, or fever only as directed by your caregiver. SEEK IMMEDIATE MEDICAL   CARE IF:  You have increasing pain or severe headaches.  You have nausea, vomiting, or drowsiness.  You have swelling around your face.  You have vision problems.  You have a stiff neck.  You have difficulty breathing. MAKE SURE YOU:   Understand these  instructions.  Will watch your condition.  Will get help right away if you are not doing well or get worse. Document Released: 06/27/2005 Document Revised: 09/19/2011 Document Reviewed: 07/12/2011 [employer] Patient Information 2014 [employer]. Amoxicillin capsules or tablets What is this medicine? AMOXICILLIN (a mox i SIL in) is a penicillin antibiotic. It is used to treat certain kinds of bacterial infections. It will not work for colds, flu, or other viral infections. This medicine may be used for other purposes; ask your health care provider or pharmacist if you have questions. COMMON BRAND NAME(S): Amoxil, Moxilin , Sumox, Trimox What should I tell my health care provider before I take this medicine? They need to know if you have any of these conditions: -asthma -kidney disease -an unusual or allergic reaction to amoxicillin, other penicillins, cephalosporin antibiotics, other medicines, foods, dyes, or preservatives -pregnant or trying to get pregnant -breast-feeding How should I use this medicine? Take this medicine by mouth with a glass of water. Follow the directions on your prescription label. You may take this medicine with food or on an empty stomach. Take your medicine at regular intervals. Do not take your medicine more often than directed. Take all of your medicine as directed even if you think your are better. Do not skip doses or stop your medicine early. Talk to your pediatrician regarding the use of this medicine in children. While this drug may be prescribed for selected conditions, precautions do apply. Overdosage: If you think you have taken too much of this medicine contact a poison control center or emergency room at once. NOTE: This medicine is only for you. Do not share this medicine with others. What if I miss a dose? If you miss a dose, take it as soon as you can. If it is almost time for your next dose, take only that dose. Do not take double or extra  doses. What may interact with this medicine? -amiloride -birth control pills -chloramphenicol -macrolides -probenecid -sulfonamides -tetracyclines This list may not describe all possible interactions. Give your health care provider a list of all the medicines, herbs, non-prescription drugs, or dietary supplements you use. Also tell them if you smoke, drink alcohol, or use illegal drugs. Some items may interact with your medicine. What should I watch for while using this medicine? Tell your doctor or health care professional if your symptoms do not improve in 2 or 3 days. Take all of the doses of your medicine as directed. Do not skip doses or stop your medicine early. If you are diabetic, you may get a false positive result for sugar in your urine with certain brands of urine tests. Check with your doctor. Do not treat diarrhea with over-the-counter products. Contact your doctor if you have diarrhea that lasts more than 2 days or if the diarrhea is severe and watery. What side effects may I notice from receiving this medicine? Side effects that you should report to your doctor or health care professional as soon as possible: -allergic reactions like skin rash, itching or hives, swelling of the face, lips, or tongue -breathing problems -dark urine -redness, blistering, peeling or loosening of the skin, including inside the mouth -seizures -severe or watery diarrhea -trouble passing urine   or change in the amount of urine -unusual bleeding or bruising -unusually weak or tired -yellowing of the eyes or skin Side effects that usually do not require medical attention (report to your doctor or health care professional if they continue or are bothersome): -dizziness -headache -stomach upset -trouble sleeping This list may not describe all possible side effects. Call your doctor for medical advice about side effects. You may report side effects to FDA at 1-800-FDA-1088. Where should I keep my  medicine? Keep out of the reach of children. Store between 68 and 77 degrees F (20 and 25 degrees C). Keep bottle closed tightly. Throw away any unused medicine after the expiration date. NOTE: This sheet is a summary. It may not cover all possible information. If you have questions about this medicine, talk to your doctor, pharmacist, or health care provider.  2014, [employer] Standard. (2007-09-18 14:10:59)  

## 2013-10-07 NOTE — ED Provider Notes (Signed)
CSN: [id number]     Arrival date & time 10/07/13  0502 History   First MD Initiated Contact with Patient 10/07/13 [phone]     Chief Complaint  Patient presents with  . Facial Pain     (Consider location/radiation/quality/duration/timing/severity/associated sxs/prior Treatment) The history is provided by the patient.   80 year old male states that he has a sinus infection. For the last 4 days, he has noted some nasal drainage that has a bad taste to it. He has not had any fever, chills, sweats. He denies any nasal congestion. The mucus that he spits out is yellow. He has had multiple similar episodes in the past and has always been treated with antibiotics. He denies cough or dyspnea. He denies arthralgias or myalgias. He denies sick contacts.  Past Medical History  Diagnosis Date  . Hypertension   . Coronary artery disease   . Hyperlipidemia    Past Surgical History  Procedure Laterality Date  . Cardiac catheterization    . Coronary angioplasty    . Tonsillectomy     No family history on file. History  Substance Use Topics  . Smoking status: Never Smoker   . Smokeless tobacco: Not on file  . Alcohol Use: No    Review of Systems  All other systems reviewed and are negative.      Allergies  Review of patient's allergies indicates no known allergies.  Home Medications   Current Outpatient Rx  Name  Route  Sig  Dispense  Refill  . amLODipine (NORVASC) 10 MG tablet   Oral   Take 5 mg by mouth daily.          Marland Kitchen. aspirin EC 81 MG tablet   Oral   Take 81 mg by mouth daily.           . chlordiazePOXIDE (LIBRIUM) 10 MG capsule   Oral   Take 10 mg by mouth at bedtime. Takes daily.  Can increase to twice daily if needed.         . hydrochlorothiazide (HYDRODIURIL) 50 MG tablet   Oral   Take 50 mg by mouth.           Marland Kitchen. lisinopril (PRINIVIL,ZESTRIL) 20 MG tablet   Oral   Take 10 mg by mouth daily.          . metoprolol (TOPROL-XL) 50 MG 24 hr tablet    Oral   Take 50 mg by mouth 2 (two) times daily.           . niacin 500 MG tablet   Oral   Take 500 mg by mouth at bedtime.          . nitroGLYCERIN (NITROSTAT) 0.4 MG SL tablet   Sublingual   Place 0.4 mg under the tongue every 5 (five) minutes as needed. For chest pain          . polyethylene glycol (MIRALAX / GLYCOLAX) packet   Oral   Take 17 g by mouth daily.           . potassium chloride (KLOR-CON) 8 MEQ CR tablet   Oral   Take 8 mEq by mouth daily.           . rosuvastatin (CRESTOR) 40 MG tablet   Oral   Take 20 mg by mouth daily.           . travoprost, benzalkonium, (TRAVATAN) 0.004 % ophthalmic solution   Both Eyes   Place 1 drop into both eyes at  bedtime.            BP 151/76  Pulse 76  Temp(Src) 97.6 F (36.4 C)  Resp 16  Ht 6\' 4"  (1.93 m)  Wt 196 lb (88.905 kg)  BMI 23.87 kg/m2  SpO2 97% Physical Exam  Nursing note and vitals reviewed.  80 year old male, resting comfortably and in no acute distress. Vital signs are significant for hypertension with blood pressure 151/76. Oxygen saturation is 97%, which is normal. Head is normocephalic and atraumatic. PERRLA, EOMI. Oropharynx is clear. Arcus senilis is present. There is mild tenderness palpation over maxillary sinuses and there is some mucoid drainage seen in the nasopharynx. Neck is nontender and supple without adenopathy or JVD. Back is nontender and there is no CVA tenderness. Lungs are clear without rales, wheezes, or rhonchi. Chest is nontender. Heart has regular rate and rhythm without murmur. Abdomen is soft, flat, nontender without masses or hepatosplenomegaly and peristalsis is normoactive. Extremities have no cyanosis or edema, full range of motion is present. Skin is warm and dry without rash. Neurologic: Mental status is normal, cranial nerves are intact, there are no motor or sensory deficits.  ED Course  Procedures (including critical care time)   MDM   Final diagnoses:   Sinusitis    Apparent recurrence of sinusitis. He is discharged with a prescription for amoxicillin.    Dione Booze, MD 10/07/13 [phone]

## 2013-10-07 NOTE — ED Notes (Signed)
Patient c/o nasal congestion and sinus pressure x 4 days.

## 2013-10-29 ENCOUNTER — Emergency Department (HOSPITAL_COMMUNITY)
Admission: EM | Admit: 2013-10-29 | Discharge: 2013-10-29 | Disposition: A | Discharge status: Home / Self Care | Payer: Medicare Other | Attending: Emergency Medicine | Admitting: Emergency Medicine

## 2013-10-29 ENCOUNTER — Encounter (HOSPITAL_COMMUNITY): Payer: Self-pay | Admitting: Emergency Medicine

## 2013-10-29 DIAGNOSIS — I10 Essential (primary) hypertension: Secondary | ICD-10-CM | POA: Insufficient documentation

## 2013-10-29 DIAGNOSIS — Z79899 Other long term (current) drug therapy: Secondary | ICD-10-CM | POA: Insufficient documentation

## 2013-10-29 DIAGNOSIS — Z7982 Long term (current) use of aspirin: Secondary | ICD-10-CM | POA: Insufficient documentation

## 2013-10-29 DIAGNOSIS — Z9861 Coronary angioplasty status: Secondary | ICD-10-CM | POA: Insufficient documentation

## 2013-10-29 DIAGNOSIS — Z9889 Other specified postprocedural states: Secondary | ICD-10-CM | POA: Insufficient documentation

## 2013-10-29 DIAGNOSIS — I2510 Atherosclerotic heart disease of native coronary artery without angina pectoris: Secondary | ICD-10-CM | POA: Insufficient documentation

## 2013-10-29 DIAGNOSIS — E785 Hyperlipidemia, unspecified: Secondary | ICD-10-CM | POA: Insufficient documentation

## 2013-10-29 DIAGNOSIS — R197 Diarrhea, unspecified: Secondary | ICD-10-CM | POA: Insufficient documentation

## 2013-10-29 LAB — BASIC METABOLIC PANEL
BUN: 18 mg/dL (ref 6–23)
CHLORIDE: 100 meq/L (ref 96–112)
CO2: 30 mEq/L (ref 19–32)
CREATININE: 1.31 mg/dL (ref 0.50–1.35)
Calcium: 9.1 mg/dL (ref 8.4–10.5)
GFR, EST AFRICAN AMERICAN: 58 mL/min — AB (ref >90)
GFR, EST NON AFRICAN AMERICAN: 50 mL/min — AB (ref >90)
GLUCOSE: 98 mg/dL (ref 70–99)
Potassium: 3.9 mEq/L (ref 3.7–5.3)
Sodium: 138 mEq/L (ref 137–147)

## 2013-10-29 LAB — CBC WITH DIFFERENTIAL/PLATELET
BASOS PCT: 0 % (ref 0–1)
Basophils Absolute: 0 10*3/uL (ref 0.0–0.1)
EOS ABS: 0 10*3/uL (ref 0.0–0.7)
Eosinophils Relative: 1 % (ref 0–5)
HEMATOCRIT: 40.8 % (ref 39.0–52.0)
HEMOGLOBIN: 13.9 g/dL (ref 13.0–17.0)
LYMPHS ABS: 0.8 10*3/uL (ref 0.7–4.0)
Lymphocytes Relative: 11 % — ABNORMAL LOW (ref 12–46)
MCH: 31.2 pg (ref 26.0–34.0)
MCHC: 34.1 g/dL (ref 30.0–36.0)
MCV: 91.7 fL (ref 78.0–100.0)
MONO ABS: 0.6 10*3/uL (ref 0.1–1.0)
MONOS PCT: 8 % (ref 3–12)
Neutro Abs: 5.8 10*3/uL (ref 1.7–7.7)
Neutrophils Relative %: 80 % — ABNORMAL HIGH (ref 43–77)
Platelets: 151 10*3/uL (ref 150–400)
RBC: 4.45 MIL/uL (ref 4.22–5.81)
RDW: 13.8 % (ref 11.5–15.5)
WBC: 7.3 10*3/uL (ref 4.0–10.5)

## 2013-10-29 LAB — URINALYSIS, ROUTINE W REFLEX MICROSCOPIC
BILIRUBIN URINE: NEGATIVE
Glucose, UA: NEGATIVE mg/dL
HGB URINE DIPSTICK: NEGATIVE
KETONES UR: NEGATIVE mg/dL
Leukocytes, UA: NEGATIVE
NITRITE: NEGATIVE
PH: 5.5 (ref 5.0–8.0)
Protein, ur: NEGATIVE mg/dL
Urobilinogen, UA: 0.2 mg/dL (ref 0.0–1.0)

## 2013-10-29 MED ORDER — LOPERAMIDE HCL 2 MG PO CAPS: 2.0000 mg | ORAL_CAPSULE | Freq: Once | ORAL | Status: DC

## 2013-10-29 MED ORDER — SODIUM CHLORIDE 0.9 % IV BOLUS (SEPSIS)
1000.0000 mL | Freq: Once | INTRAVENOUS | Status: AC
  Administered 2013-10-29: 1000 mL via INTRAVENOUS

## 2013-10-29 NOTE — ED Provider Notes (Signed)
CSN: [id number]     Arrival date & time 10/29/13  0307 History   First MD Initiated Contact with Patient 10/29/13 [phone]     Chief Complaint  Patient presents with  . Diarrhea     (Consider location/radiation/quality/duration/timing/severity/associated sxs/prior Treatment) Patient is a 80 y.o. male presenting with diarrhea. The history is provided by the patient.  Diarrhea He had onset at about midnight of diarrhea. He has had about 8 loose bowel movements since then. He denies any blood or mucus in the stool and denies any abdominal pain. There's been mild nausea but no vomiting. He denies any sick contacts or eating any unusual foods. He took a dose of loperamide 2 mg without relief. He states that this usually helps him but when he continued to have diarrhea, he decided to come in to be evaluated further. Since arrival in the ED, he has not had any more diarrhea nor any sense of additional diarrhea being imminent.  Past Medical History  Diagnosis Date  . Hypertension   . Coronary artery disease   . Hyperlipidemia    Past Surgical History  Procedure Laterality Date  . Cardiac catheterization    . Coronary angioplasty    . Tonsillectomy     History reviewed. No pertinent family history. History  Substance Use Topics  . Smoking status: Never Smoker   . Smokeless tobacco: Not on file  . Alcohol Use: No    Review of Systems  Gastrointestinal: Positive for diarrhea.  All other systems reviewed and are negative.     Allergies  Review of patient's allergies indicates no known allergies.  Home Medications   Prior to Admission medications   Medication Sig Start Date End Date Taking? Authorizing Provider  amLODipine (NORVASC) 10 MG tablet Take 5 mg by mouth daily.    Yes Historical Provider, MD  aspirin EC 81 MG tablet Take 81 mg by mouth daily.     Yes Historical Provider, MD  chlordiazePOXIDE (LIBRIUM) 10 MG capsule Take 10 mg by mouth at bedtime. Takes daily.  Can increase  to twice daily if needed.   Yes Historical Provider, MD  hydrochlorothiazide (HYDRODIURIL) 50 MG tablet Take 50 mg by mouth.     Yes Historical Provider, MD  lisinopril (PRINIVIL,ZESTRIL) 20 MG tablet Take 10 mg by mouth daily.    Yes Historical Provider, MD  metoprolol (TOPROL-XL) 50 MG 24 hr tablet Take 50 mg by mouth 2 (two) times daily.     Yes Historical Provider, MD  niacin 500 MG tablet Take 500 mg by mouth at bedtime.    Yes Historical Provider, MD  nitroGLYCERIN (NITROSTAT) 0.4 MG SL tablet Place 0.4 mg under the tongue every 5 (five) minutes as needed. For chest pain    Yes Historical Provider, MD  polyethylene glycol (MIRALAX / GLYCOLAX) packet Take 17 g by mouth daily.     Yes Historical Provider, MD  potassium chloride (KLOR-CON) 8 MEQ CR tablet Take 8 mEq by mouth daily.     Yes Historical Provider, MD  rosuvastatin (CRESTOR) 40 MG tablet Take 20 mg by mouth daily.     Yes Historical Provider, MD  travoprost, benzalkonium, (TRAVATAN) 0.004 % ophthalmic solution Place 1 drop into both eyes at bedtime.     Yes Historical Provider, MD  amoxicillin (AMOXIL) 500 MG capsule Take 2 capsules (1,000 mg total) by mouth 3 (three) times daily. 10/07/13   Dione Booze, MD   BP 125/78  Pulse 78  Temp(Src) 97.9 F (36.6  C) (Oral)  Resp 18  Ht 6\' 4"  (1.93 m)  Wt 196 lb (88.905 kg)  BMI 23.87 kg/m2  SpO2 100% Physical Exam  Nursing note and vitals reviewed.  80 year old male, resting comfortably and in no acute distress. Vital signs are normal. Oxygen saturation is 100%, which is normal. Head is normocephalic and atraumatic. PERRLA, gaze is disconjugate with right eye deviating laterally. Oropharynx is clear. Arcus senilis is present. Neck is nontender and supple without adenopathy or JVD. Back is nontender and there is no CVA tenderness. Lungs are clear without rales, wheezes, or rhonchi. Chest is nontender. Heart has regular rate and rhythm without murmur. Abdomen is soft, flat, nontender  without masses or hepatosplenomegaly and peristalsis is hyperoactive. Extremities have no cyanosis or edema, full range of motion is present. Skin is warm and dry without rash. Neurologic: Mental status is normal, cranial nerves are intact, there are no motor or sensory deficits.  ED Course  Procedures (including critical care time) Labs Review Results for orders placed during the hospital encounter of 10/29/13  CBC WITH DIFFERENTIAL      Result Value Ref Range   WBC 7.3  4.0 - 10.5 K/uL   RBC 4.45  4.22 - 5.81 MIL/uL   Hemoglobin 13.9  13.0 - 17.0 g/dL   HCT 16.140.8  09.639.0 - 04.552.0 %   MCV 91.7  78.0 - 100.0 fL   MCH 31.2  26.0 - 34.0 pg   MCHC 34.1  30.0 - 36.0 g/dL   RDW 40.913.8  81.111.5 - 91.415.5 %   Platelets 151  150 - 400 K/uL   Neutrophils Relative % 80 (*) 43 - 77 %   Neutro Abs 5.8  1.7 - 7.7 K/uL   Lymphocytes Relative 11 (*) 12 - 46 %   Lymphs Abs 0.8  0.7 - 4.0 K/uL   Monocytes Relative 8  3 - 12 %   Monocytes Absolute 0.6  0.1 - 1.0 K/uL   Eosinophils Relative 1  0 - 5 %   Eosinophils Absolute 0.0  0.0 - 0.7 K/uL   Basophils Relative 0  0 - 1 %   Basophils Absolute 0.0  0.0 - 0.1 K/uL  BASIC METABOLIC PANEL      Result Value Ref Range   Sodium 138  137 - 147 mEq/L   Potassium 3.9  3.7 - 5.3 mEq/L   Chloride 100  96 - 112 mEq/L   CO2 30  19 - 32 mEq/L   Glucose, Bld 98  70 - 99 mg/dL   BUN 18  6 - 23 mg/dL   Creatinine, Ser 7.821.31  0.50 - 1.35 mg/dL   Calcium 9.1  8.4 - 95.610.5 mg/dL   GFR calc non Af Amer 50 (*) >90 mL/min   GFR calc Af Amer 58 (*) >90 mL/min  URINALYSIS, ROUTINE W REFLEX MICROSCOPIC      Result Value Ref Range   Color, Urine STRAW (*) YELLOW   APPearance CLEAR  CLEAR   Specific Gravity, Urine <1.005 (*) 1.005 - 1.030   pH 5.5  5.0 - 8.0   Glucose, UA NEGATIVE  NEGATIVE mg/dL   Hgb urine dipstick NEGATIVE  NEGATIVE   Bilirubin Urine NEGATIVE  NEGATIVE   Ketones, ur NEGATIVE  NEGATIVE mg/dL   Protein, ur NEGATIVE  NEGATIVE mg/dL   Urobilinogen, UA 0.2   0.0 - 1.0 mg/dL   Nitrite NEGATIVE  NEGATIVE   Leukocytes, UA NEGATIVE  NEGATIVE   MDM  Final diagnoses:  Diarrhea    Diarrhea without any retroflexed to suspect serious pathology. He is given IV hydration and. CBC and metabolic panel are unremarkable. I anticipate sending him home with instructions to take additional doses of loperamide as needed.    Dione Boozeavid Leo Weyandt, MD 10/29/13 [phone]

## 2013-10-29 NOTE — ED Notes (Signed)
Pt alert & oriented x4, stable gait. Patient given discharge instructions, paperwork & prescription(s). Patient  instructed to stop at the registration desk to finish any additional paperwork. Patient verbalized understanding. Pt left department w/ no further questions. 

## 2013-10-29 NOTE — ED Notes (Signed)
Pt reports diarrhea since midnight.  Denies nausea, vomiting or abdominal pain.  Reports he did take one Imodium about an hour ago.

## 2013-10-29 NOTE — Discharge Instructions (Signed)
Take Loperamide (Imodium AD) as needed for diarrhea.  Diarrhea Diarrhea is frequent loose and watery bowel movements. It can cause you to feel weak and dehydrated. Dehydration can cause you to become tired and thirsty, have a dry mouth, and have decreased urination that often is dark yellow. Diarrhea is a sign of another problem, most often an infection that will not last long. In most cases, diarrhea typically lasts 2 3 days. However, it can last longer if it is a sign of something more serious. It is important to treat your diarrhea as directed by your caregive to lessen or prevent future episodes of diarrhea. CAUSES  Some common causes include:  Gastrointestinal infections caused by viruses, bacteria, or parasites.  Food poisoning or food allergies.  Certain medicines, such as antibiotics, chemotherapy, and laxatives.  Artificial sweeteners and fructose.  Digestive disorders. HOME CARE INSTRUCTIONS  Ensure adequate fluid intake (hydration): have 1 cup (8 oz) of fluid for each diarrhea episode. Avoid fluids that contain simple sugars or sports drinks, fruit juices, whole milk products, and sodas. Your urine should be clear or pale yellow if you are drinking enough fluids. Hydrate with an oral rehydration solution that you can purchase at pharmacies, retail stores, and online. You can prepare an oral rehydration solution at home by mixing the following ingredients together:    tsp table salt.   tsp baking soda.   tsp salt substitute containing potassium chloride.  1  tablespoons sugar.  1 L (34 oz) of water.  Certain foods and beverages may increase the speed at which food moves through the gastrointestinal (GI) tract. These foods and beverages should be avoided and include:  Caffeinated and alcoholic beverages.  High-fiber foods, such as raw fruits and vegetables, nuts, seeds, and whole grain breads and cereals.  Foods and beverages sweetened with sugar alcohols, such as  xylitol, sorbitol, and mannitol.  Some foods may be well tolerated and may help thicken stool including:  Starchy foods, such as rice, toast, pasta, low-sugar cereal, oatmeal, grits, baked potatoes, crackers, and bagels.  Bananas.  Applesauce.  Add probiotic-rich foods to help increase healthy bacteria in the GI tract, such as yogurt and fermented milk products.  Wash your hands well after each diarrhea episode.  Only take over-the-counter or prescription medicines as directed by your caregiver.  Take a warm bath to relieve any burning or pain from frequent diarrhea episodes. SEEK IMMEDIATE MEDICAL CARE IF:   You are unable to keep fluids down.  You have persistent vomiting.  You have blood in your stool, or your stools are black and tarry.  You do not urinate in 6 8 hours, or there is only a small amount of very dark urine.  You have abdominal pain that increases or localizes.  You have weakness, dizziness, confusion, or lightheadedness.  You have a severe headache.  Your diarrhea gets worse or does not get better.  You have a fever or persistent symptoms for more than 2 3 days.  You have a fever and your symptoms suddenly get worse. MAKE SURE YOU:   Understand these instructions.  Will watch your condition.  Will get help right away if you are not doing well or get worse. Document Released: 06/17/2002 Document Revised: 06/13/2012 Document Reviewed: 03/04/2012 [facility] Patient Information 2014 [place], [place].

## 2013-10-29 NOTE — ED Notes (Signed)
Pt states he started w/ diarrhea around 0000. Pt took imodium & states he just does not feel good now.

## 2014-02-18 ENCOUNTER — Emergency Department (HOSPITAL_COMMUNITY)
Admission: EM | Admit: 2014-02-18 | Discharge: 2014-02-18 | Disposition: A | Discharge status: Home / Self Care | Payer: Medicare Other | Attending: Emergency Medicine | Admitting: Emergency Medicine

## 2014-02-18 ENCOUNTER — Encounter (HOSPITAL_COMMUNITY): Payer: Self-pay | Admitting: Emergency Medicine

## 2014-02-18 DIAGNOSIS — Z9861 Coronary angioplasty status: Secondary | ICD-10-CM | POA: Insufficient documentation

## 2014-02-18 DIAGNOSIS — Z79899 Other long term (current) drug therapy: Secondary | ICD-10-CM | POA: Diagnosis not present

## 2014-02-18 DIAGNOSIS — Z7982 Long term (current) use of aspirin: Secondary | ICD-10-CM | POA: Diagnosis not present

## 2014-02-18 DIAGNOSIS — E785 Hyperlipidemia, unspecified: Secondary | ICD-10-CM | POA: Diagnosis not present

## 2014-02-18 DIAGNOSIS — K5289 Other specified noninfective gastroenteritis and colitis: Secondary | ICD-10-CM | POA: Diagnosis not present

## 2014-02-18 DIAGNOSIS — I10 Essential (primary) hypertension: Secondary | ICD-10-CM | POA: Insufficient documentation

## 2014-02-18 DIAGNOSIS — Z9889 Other specified postprocedural states: Secondary | ICD-10-CM | POA: Insufficient documentation

## 2014-02-18 DIAGNOSIS — K529 Noninfective gastroenteritis and colitis, unspecified: Secondary | ICD-10-CM

## 2014-02-18 DIAGNOSIS — R197 Diarrhea, unspecified: Secondary | ICD-10-CM | POA: Insufficient documentation

## 2014-02-18 DIAGNOSIS — I2510 Atherosclerotic heart disease of native coronary artery without angina pectoris: Secondary | ICD-10-CM | POA: Diagnosis not present

## 2014-02-18 LAB — URINALYSIS, ROUTINE W REFLEX MICROSCOPIC
BILIRUBIN URINE: NEGATIVE
Glucose, UA: NEGATIVE mg/dL
Ketones, ur: NEGATIVE mg/dL
LEUKOCYTES UA: NEGATIVE
NITRITE: NEGATIVE
PH: 6 (ref 5.0–8.0)
Protein, ur: NEGATIVE mg/dL
SPECIFIC GRAVITY, URINE: 1.01 (ref 1.005–1.030)
UROBILINOGEN UA: 0.2 mg/dL (ref 0.0–1.0)

## 2014-02-18 LAB — COMPREHENSIVE METABOLIC PANEL
ALBUMIN: 3.7 g/dL (ref 3.5–5.2)
ALK PHOS: 67 U/L (ref 39–117)
ALT: 18 U/L (ref 0–53)
ANION GAP: 10 (ref 5–15)
AST: 24 U/L (ref 0–37)
BUN: 17 mg/dL (ref 6–23)
CALCIUM: 9.4 mg/dL (ref 8.4–10.5)
CO2: 29 mEq/L (ref 19–32)
CREATININE: 1.3 mg/dL (ref 0.50–1.35)
Chloride: 100 mEq/L (ref 96–112)
GFR calc non Af Amer: 50 mL/min — ABNORMAL LOW (ref >90)
GFR, EST AFRICAN AMERICAN: 58 mL/min — AB (ref >90)
GLUCOSE: 94 mg/dL (ref 70–99)
Potassium: 3.9 mEq/L (ref 3.7–5.3)
Sodium: 139 mEq/L (ref 137–147)
TOTAL PROTEIN: 7.6 g/dL (ref 6.0–8.3)
Total Bilirubin: 0.4 mg/dL (ref 0.3–1.2)

## 2014-02-18 LAB — CBC WITH DIFFERENTIAL/PLATELET
BASOS PCT: 0 % (ref 0–1)
Basophils Absolute: 0 10*3/uL (ref 0.0–0.1)
EOS ABS: 0.1 10*3/uL (ref 0.0–0.7)
EOS PCT: 1 % (ref 0–5)
HCT: 40.6 % (ref 39.0–52.0)
HEMOGLOBIN: 13.8 g/dL (ref 13.0–17.0)
LYMPHS ABS: 1.1 10*3/uL (ref 0.7–4.0)
Lymphocytes Relative: 21 % (ref 12–46)
MCH: 30.8 pg (ref 26.0–34.0)
MCHC: 34 g/dL (ref 30.0–36.0)
MCV: 90.6 fL (ref 78.0–100.0)
MONO ABS: 0.6 10*3/uL (ref 0.1–1.0)
MONOS PCT: 11 % (ref 3–12)
NEUTROS PCT: 67 % (ref 43–77)
Neutro Abs: 3.5 10*3/uL (ref 1.7–7.7)
Platelets: 149 10*3/uL — ABNORMAL LOW (ref 150–400)
RBC: 4.48 MIL/uL (ref 4.22–5.81)
RDW: 13.8 % (ref 11.5–15.5)
WBC: 5.2 10*3/uL (ref 4.0–10.5)

## 2014-02-18 LAB — URINE MICROSCOPIC-ADD ON

## 2014-02-18 MED ORDER — SODIUM CHLORIDE 0.9 % IV BOLUS (SEPSIS)
500.0000 mL | Freq: Once | INTRAVENOUS | Status: AC
  Administered 2014-02-18: 500 mL via INTRAVENOUS

## 2014-02-18 NOTE — Discharge Instructions (Signed)
Drink plenty of fluids and take imodium for diarhea.  Follow up with your md as needed

## 2014-02-18 NOTE — ED Provider Notes (Signed)
CSN: [id number]     Arrival date & time 02/18/14  0444 History  This chart was scribed for Benny LennertJoseph L Larry Alcock, MD by Leone PayorSonum Patel, ED Scribe. This patient was seen in room [facility]18[facility]18 and the patient's care was started 7:15 AM.    Chief Complaint  Patient presents with  . Diarrhea    Patient is a 80 y.o. male presenting with diarrhea. The history is provided by the patient. No language interpreter was used.  Diarrhea Quality:  Unable to specify Severity:  Mild Number of episodes:  3-4 Duration:  1 day Timing:  Intermittent Progression:  Resolved Relieved by:  Anti-motility medications Worsened by:  Nothing tried Associated symptoms: no abdominal pain, no fever, no headaches and no vomiting     HPI Comments: Samuel Wilkerson is a 80 y.o. male with past medical history of HTN, HLD, CAD who presents to the Emergency Department complaining of 3-4 episodes of intermittent, gradually improving diarrhea with associated decreased appetite that began yesterday. His last dose of imodium was this morning and he denies any episodes of diarrhea since then. He denies hematochezia, nausea, vomiting, abdominal pain.    Past Medical History  Diagnosis Date  . Hypertension   . Coronary artery disease   . Hyperlipidemia    Past Surgical History  Procedure Laterality Date  . Cardiac catheterization    . Coronary angioplasty    . Tonsillectomy     History reviewed. No pertinent family history. History  Substance Use Topics  . Smoking status: Never Smoker   . Smokeless tobacco: Not on file  . Alcohol Use: No    Review of Systems  Constitutional: Negative for fever, appetite change and fatigue.  HENT: Negative for congestion, ear discharge and sinus pressure.   Eyes: Negative for discharge.  Respiratory: Negative for cough.   Cardiovascular: Negative for chest pain.  Gastrointestinal: Positive for diarrhea. Negative for nausea, vomiting, abdominal pain, blood in stool and anal bleeding.   Genitourinary: Negative for frequency and hematuria.  Musculoskeletal: Negative for back pain.  Skin: Negative for rash.  Neurological: Negative for seizures and headaches.  Psychiatric/Behavioral: Negative for hallucinations.      Allergies  Review of patient's allergies indicates no known allergies.  Home Medications   Prior to Admission medications   Medication Sig Start Date End Date Taking? Authorizing Provider  amLODipine (NORVASC) 10 MG tablet Take 5 mg by mouth daily.    Yes Historical Provider, MD  aspirin EC 81 MG tablet Take 81 mg by mouth daily.     Yes Historical Provider, MD  chlordiazePOXIDE (LIBRIUM) 10 MG capsule Take 10 mg by mouth at bedtime. Takes daily.  Can increase to twice daily if needed.   Yes Historical Provider, MD  hydrochlorothiazide (HYDRODIURIL) 50 MG tablet Take 50 mg by mouth.     Yes Historical Provider, MD  lisinopril (PRINIVIL,ZESTRIL) 20 MG tablet Take 10 mg by mouth daily.    Yes Historical Provider, MD  metoprolol (TOPROL-XL) 50 MG 24 hr tablet Take 50 mg by mouth 2 (two) times daily.     Yes Historical Provider, MD  niacin 500 MG tablet Take 500 mg by mouth at bedtime.    Yes Historical Provider, MD  nitroGLYCERIN (NITROSTAT) 0.4 MG SL tablet Place 0.4 mg under the tongue every 5 (five) minutes as needed. For chest pain    Yes Historical Provider, MD  polyethylene glycol (MIRALAX / GLYCOLAX) packet Take 17 g by mouth daily.     Yes Historical  Provider, MD  potassium chloride (KLOR-CON) 8 MEQ CR tablet Take 8 mEq by mouth daily.     Yes Historical Provider, MD  rosuvastatin (CRESTOR) 40 MG tablet Take 20 mg by mouth daily.     Yes Historical Provider, MD  travoprost, benzalkonium, (TRAVATAN) 0.004 % ophthalmic solution Place 1 drop into both eyes at bedtime.     Yes Historical Provider, MD  amoxicillin (AMOXIL) 500 MG capsule Take 2 capsules (1,000 mg total) by mouth 3 (three) times daily. 10/07/13   Dione Booze, MD   BP 135/92  Pulse 73   Temp(Src) 97.6 F (36.4 C) (Oral)  Resp 20  Ht 6\' 4"  (1.93 m)  Wt 196 lb (88.905 kg)  BMI 23.87 kg/m2  SpO2 100% Physical Exam  Nursing note and vitals reviewed. Constitutional: He is oriented to person, place, and time. He appears well-developed.  HENT:  Head: Normocephalic.  Eyes: Conjunctivae and EOM are normal. No scleral icterus.  Neck: Neck supple. No thyromegaly present.  Cardiovascular: Normal rate, regular rhythm and normal heart sounds.  Exam reveals no gallop and no friction rub.   No murmur heard. Pulmonary/Chest: Effort normal and breath sounds normal. No stridor. He has no wheezes. He has no rales. He exhibits no tenderness.  Abdominal: Soft. He exhibits no distension. There is no tenderness. There is no rebound.  Musculoskeletal: Normal range of motion. He exhibits no edema.  Lymphadenopathy:    He has no cervical adenopathy.  Neurological: He is oriented to person, place, and time. He exhibits normal muscle tone. Coordination normal.  Skin: No rash noted. No erythema.  Psychiatric: He has a normal mood and affect. His behavior is normal.    ED Course  Procedures (including critical care time)  DIAGNOSTIC STUDIES: Oxygen Saturation is 100% on RA, normal by my interpretation.    COORDINATION OF CARE: 7:21 AM Discussed treatment plan with pt at bedside and pt agreed to plan.   Labs Review Labs Reviewed  CBC WITH DIFFERENTIAL - Abnormal; Notable for the following:    Platelets 149 (*)    All other components within normal limits  COMPREHENSIVE METABOLIC PANEL - Abnormal; Notable for the following:    GFR calc non Af Amer 50 (*)    GFR calc Af Amer 58 (*)    All other components within normal limits  URINALYSIS, ROUTINE W REFLEX MICROSCOPIC - Abnormal; Notable for the following:    Hgb urine dipstick TRACE (*)    All other components within normal limits  URINE MICROSCOPIC-ADD ON    Imaging Review No results found.   EKG Interpretation None       MDM   Final diagnoses:  None   The chart was scribed for me under my direct supervision.  I personally performed the history, physical, and medical decision making and all procedures in the evaluation of this patient.Benny Lennert, MD 02/18/14 [phone]

## 2014-02-18 NOTE — ED Notes (Signed)
Pt c/o diarrhea since yesterday. Denies relief from imodium.  Reporting decreased appetite. Denies nausea or vomiting.

## 2015-01-24 ENCOUNTER — Encounter (HOSPITAL_COMMUNITY): Payer: Self-pay | Admitting: *Deleted

## 2015-01-24 ENCOUNTER — Emergency Department (HOSPITAL_COMMUNITY)
Admission: EM | Admit: 2015-01-24 | Discharge: 2015-01-24 | Disposition: A | Discharge status: Home / Self Care | Payer: Medicare Other | Attending: Emergency Medicine | Admitting: Emergency Medicine

## 2015-01-24 DIAGNOSIS — E785 Hyperlipidemia, unspecified: Secondary | ICD-10-CM | POA: Diagnosis not present

## 2015-01-24 DIAGNOSIS — Z9889 Other specified postprocedural states: Secondary | ICD-10-CM | POA: Insufficient documentation

## 2015-01-24 DIAGNOSIS — I2510 Atherosclerotic heart disease of native coronary artery without angina pectoris: Secondary | ICD-10-CM | POA: Insufficient documentation

## 2015-01-24 DIAGNOSIS — Z79899 Other long term (current) drug therapy: Secondary | ICD-10-CM | POA: Diagnosis not present

## 2015-01-24 DIAGNOSIS — Z7982 Long term (current) use of aspirin: Secondary | ICD-10-CM | POA: Insufficient documentation

## 2015-01-24 DIAGNOSIS — Z9861 Coronary angioplasty status: Secondary | ICD-10-CM | POA: Diagnosis not present

## 2015-01-24 DIAGNOSIS — I10 Essential (primary) hypertension: Secondary | ICD-10-CM | POA: Diagnosis present

## 2015-01-24 NOTE — ED Notes (Addendum)
Pt reporting elevated BP.  States that he did take his medication this morning, but his BP was elevated this afternoon when he checked. States that he did take a second dose of cetirizine this evening, aprox 1 hour ago.  183/86 in triage.  Denies any CP or SOB at this time.

## 2015-01-24 NOTE — Discharge Instructions (Signed)
Follow-up your primary care doctor. °

## 2015-01-24 NOTE — ED Provider Notes (Signed)
CSN: [id number]     Arrival date & time 01/24/15  1902 History   First MD Initiated Contact with Patient 01/24/15 1913     Chief Complaint  Patient presents with  . Hypertension     (Consider location/radiation/quality/duration/timing/severity/associated sxs/prior Treatment) HPI..... Blood pressure elevated today. He has been taking his blood pressure medication. Patient states blood pressure at home was 170/100. No chest pain, dyspnea, neurological deficits. He is not ill in any way.  Past Medical History  Diagnosis Date  . Hypertension   . Coronary artery disease   . Hyperlipidemia    Past Surgical History  Procedure Laterality Date  . Cardiac catheterization    . Coronary angioplasty    . Tonsillectomy     History reviewed. No pertinent family history. History  Substance Use Topics  . Smoking status: Never Smoker   . Smokeless tobacco: Not on file  . Alcohol Use: No    Review of Systems  All other systems reviewed and are negative.     Allergies  Review of patient's allergies indicates no known allergies.  Home Medications   Prior to Admission medications   Medication Sig Start Date End Date Taking? Authorizing Provider  amLODipine (NORVASC) 10 MG tablet Take 5 mg by mouth daily.     Historical Provider, MD  amoxicillin (AMOXIL) 500 MG capsule Take 2 capsules (1,000 mg total) by mouth 3 (three) times daily. 10/07/13   Dione Boozeavid Glick, MD  aspirin EC 81 MG tablet Take 81 mg by mouth daily.      Historical Provider, MD  chlordiazePOXIDE (LIBRIUM) 10 MG capsule Take 10 mg by mouth at bedtime. Takes daily.  Can increase to twice daily if needed.    Historical Provider, MD  hydrochlorothiazide (HYDRODIURIL) 50 MG tablet Take 50 mg by mouth.      Historical Provider, MD  lisinopril (PRINIVIL,ZESTRIL) 20 MG tablet Take 10 mg by mouth daily.     Historical Provider, MD  metoprolol (TOPROL-XL) 50 MG 24 hr tablet Take 50 mg by mouth 2 (two) times daily.      Historical  Provider, MD  niacin 500 MG tablet Take 500 mg by mouth at bedtime.     Historical Provider, MD  nitroGLYCERIN (NITROSTAT) 0.4 MG SL tablet Place 0.4 mg under the tongue every 5 (five) minutes as needed. For chest pain     Historical Provider, MD  polyethylene glycol (MIRALAX / GLYCOLAX) packet Take 17 g by mouth daily.      Historical Provider, MD  potassium chloride (KLOR-CON) 8 MEQ CR tablet Take 8 mEq by mouth daily.      Historical Provider, MD  rosuvastatin (CRESTOR) 40 MG tablet Take 20 mg by mouth daily.      Historical Provider, MD  travoprost, benzalkonium, (TRAVATAN) 0.004 % ophthalmic solution Place 1 drop into both eyes at bedtime.      Historical Provider, MD   BP 122/79 mmHg  Pulse 72  Temp(Src) 97.5 F (36.4 C) (Oral)  Resp 22  Ht 6\' 4"  (1.93 m)  Wt 200 lb (90.719 kg)  BMI 24.35 kg/m2  SpO2 98% Physical Exam  Constitutional: He is oriented to person, place, and time. He appears well-developed and well-nourished.  HENT:  Head: Normocephalic and atraumatic.  Eyes: Conjunctivae and EOM are normal. Pupils are equal, round, and reactive to light.  Neck: Normal range of motion. Neck supple.  Cardiovascular: Normal rate and regular rhythm.   Pulmonary/Chest: Effort normal and breath sounds normal.  Abdominal: Soft.  Bowel sounds are normal.  Musculoskeletal: Normal range of motion.  Neurological: He is alert and oriented to person, place, and time.  Skin: Skin is warm and dry.  Psychiatric: He has a normal mood and affect. His behavior is normal.  Nursing note and vitals reviewed.   ED Course  Procedures (including critical care time) Labs Review Labs Reviewed - No data to display  Imaging Review No results found.   EKG Interpretation None      MDM   Final diagnoses:  Essential hypertension    Patient is hemodynamically stable. No testing required today. Discussed blood pressure findings with patient.    Donnetta Hutching, MD 01/24/15 2018

## 2015-04-14 ENCOUNTER — Encounter: Payer: Self-pay | Admitting: Gastroenterology

## 2015-06-09 ENCOUNTER — Ambulatory Visit (INDEPENDENT_AMBULATORY_CARE_PROVIDER_SITE_OTHER): Payer: Medicare Other | Admitting: Gastroenterology

## 2015-06-09 ENCOUNTER — Encounter: Payer: Self-pay | Admitting: Gastroenterology

## 2015-06-09 VITALS — BP 100/72 | HR 60 | Ht 76.0 in | Wt 205.2 lb

## 2015-06-09 DIAGNOSIS — K6389 Other specified diseases of intestine: Secondary | ICD-10-CM

## 2015-06-09 NOTE — Patient Instructions (Addendum)
We will get records sent from your previous gastroenterologist, surgeon (Dr. Franky MachoMark Jenkins) for review.  This will include any endoscopic (colonoscopy or upper endoscopy) procedures and any associated pathology reports.  2009 colonoscopy, 2009 right hemicolectomy, any colonoscopies since then as well. Also we [facility] (Dr. Sudie BaileyKnowlton may have these records). After reviewing those reports, will decide on if/when you should have repeat colonoscopy.

## 2015-06-09 NOTE — Progress Notes (Signed)
HPI: This is a very pleasant 81 year old man  who was referred to me by Gareth MorganKnowlton, Steve, MD  to evaluate  for colon cancer screening .    Chief complaint is personal history of colon mass  He had right hemicolectomy in 2009 Dr. Franky MachoMark Jenkins for cecal polyp (per notes in epic).  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  He had a colonoscopy 3-4 years ago in [place], he understands it was normal.   He thinks that  Dr. Sudie BaileyKnowlton may have a copy.  No chest pains.  He has had no troubles with his bowels specifically no bleeding, no constipation no changes. His weight has been overall stable   Review of systems: Pertinent positive and negative review of systems were noted in the above HPI section. Complete review of systems was performed and was otherwise normal.   Past Medical History  Diagnosis Date  . Hypertension   . Coronary artery disease   . Hyperlipidemia     Past Surgical History  Procedure Laterality Date  . Cardiac catheterization    . Coronary angioplasty    . Tonsillectomy      Current Outpatient Prescriptions  Medication Sig Dispense Refill  . amLODipine (NORVASC) 10 MG tablet Take 5 mg by mouth daily.     . chlordiazePOXIDE (LIBRIUM) 10 MG capsule Take 10 mg by mouth at bedtime. Takes daily.  Can increase to twice daily if needed.    . hydrochlorothiazide (HYDRODIURIL) 50 MG tablet Take 50 mg by mouth.      Marland Kitchen. lisinopril (PRINIVIL,ZESTRIL) 20 MG tablet Take 10 mg by mouth daily.     . metoprolol (TOPROL-XL) 50 MG 24 hr tablet Take 50 mg by mouth 2 (two) times daily.      . niacin 500 MG tablet Take 500 mg by mouth at bedtime.     . nitroGLYCERIN (NITROSTAT) 0.4 MG SL tablet Place 0.4 mg under the tongue every 5 (five) minutes as needed. For chest pain     . polyethylene glycol (MIRALAX / GLYCOLAX) packet Take 17 g by mouth daily.      . potassium chloride (KLOR-CON) 8 MEQ CR tablet Take 8 mEq by  mouth daily.      . rosuvastatin (CRESTOR) 40 MG tablet Take 20 mg by mouth daily.      . travoprost, benzalkonium, (TRAVATAN) 0.004 % ophthalmic solution Place 1 drop into both eyes at bedtime.      Marland Kitchen. aspirin EC 81 MG tablet Take 81 mg by mouth daily.      . [DISCONTINUED] fluticasone (FLONASE) 50 MCG/ACT nasal spray Place 2 sprays into the nose daily. 16 g 2   No current facility-administered medications for this visit.    Allergies as of 06/09/2015  . (No Known Allergies)    History reviewed. No pertinent family history.  Social History   Social History  . Marital Status: Widowed    Spouse Name: N/A  . Number of Children: 1  . Years of Education: N/A   Occupational History  . Retired    Social History Main Topics  . Smoking status: Never Smoker   . Smokeless tobacco: Never Used  . Alcohol Use: No  . Drug Use: No  . Sexual Activity: Not on file   Other Topics Concern  . Not on file   Social History Narrative     Physical Exam: BP 100/72 mmHg  Pulse 60  Ht  (1.93 m)  Wt 205 lb 3.2 oz (93.078 kg)  BMI 24.99 kg/m2 Constitutional: generally well-appearing Psychiatric: alert and oriented x3 Eyes: extraocular movements intact Mouth: oral pharynx moist, no lesions Neck: supple no lymphadenopathy Cardiovascular: heart regular rate and rhythm Lungs: clear to auscultation bilaterally Abdomen: soft, nontender, nondistended, no obvious ascites, no peritoneal signs, normal bowel sounds Extremities: no lower extremity edema bilaterally Skin: no lesions on visible extremities   Assessment and plan: 81 y.o. male with  personal history of colon mass  We don't have records from his 2009 colonoscopy or his 2009 right hemicolectomy in terms of the pathology reports. We'll try to get those sent here for review. From some of the documentation around that time it does seem that he had a villous adenoma that was not frank cancer. Since then he has had at least one  colonoscopy. He believes this was 3-4 years ago in [place]. He understands that it was normal. We don't have those records either. He is 51 and I explained to him the generally colon cancer screening, polyp surveillance stops around the ages of 95-80. We will try to gather records from his previous surgery, colonoscopies and I'll make a final decision about if, when he needs further colon cancer screening after reviewing those records.   Rob Bunting, MD [facility] 06/09/2015, 10:13 AM  Cc: Gareth Morgan, MD

## 2015-09-05 ENCOUNTER — Encounter (HOSPITAL_COMMUNITY): Payer: Self-pay

## 2015-09-05 ENCOUNTER — Emergency Department (HOSPITAL_COMMUNITY)
Admission: EM | Admit: 2015-09-05 | Discharge: 2015-09-05 | Disposition: A | Discharge status: Home / Self Care | Payer: Medicare Other | Attending: Emergency Medicine | Admitting: Emergency Medicine

## 2015-09-05 DIAGNOSIS — Z9861 Coronary angioplasty status: Secondary | ICD-10-CM | POA: Insufficient documentation

## 2015-09-05 DIAGNOSIS — I2510 Atherosclerotic heart disease of native coronary artery without angina pectoris: Secondary | ICD-10-CM | POA: Insufficient documentation

## 2015-09-05 DIAGNOSIS — R0982 Postnasal drip: Secondary | ICD-10-CM | POA: Insufficient documentation

## 2015-09-05 DIAGNOSIS — I10 Essential (primary) hypertension: Secondary | ICD-10-CM | POA: Insufficient documentation

## 2015-09-05 DIAGNOSIS — E785 Hyperlipidemia, unspecified: Secondary | ICD-10-CM | POA: Diagnosis not present

## 2015-09-05 DIAGNOSIS — Z79899 Other long term (current) drug therapy: Secondary | ICD-10-CM | POA: Insufficient documentation

## 2015-09-05 DIAGNOSIS — R0981 Nasal congestion: Secondary | ICD-10-CM | POA: Diagnosis present

## 2015-09-05 DIAGNOSIS — J329 Chronic sinusitis, unspecified: Secondary | ICD-10-CM | POA: Insufficient documentation

## 2015-09-05 DIAGNOSIS — Z7982 Long term (current) use of aspirin: Secondary | ICD-10-CM | POA: Diagnosis not present

## 2015-09-05 MED ORDER — AMOXICILLIN 500 MG PO CAPS: 500.0000 mg | ORAL_CAPSULE | Freq: Three times a day (TID) | ORAL | Status: DC

## 2015-09-05 NOTE — ED Notes (Signed)
Pt states he has had sinus drainage all week, seen at the [place] and given sinus meds without relief.  Pt denies pain, states he gets nauseated when the drainage runs down into his stomach.

## 2015-09-05 NOTE — Discharge Instructions (Signed)

## 2015-09-05 NOTE — ED Provider Notes (Signed)
CSN: [id number]     Arrival date & time 09/05/15  0500 History   First MD Initiated Contact with Patient 09/05/15 [phone]     Chief Complaint  Patient presents with  . Recurrent Sinusitis     (Consider location/radiation/quality/duration/timing/severity/associated sxs/prior Treatment) HPI Comments: Patient reports one week history of sinus drainage, congestion and postnasal drip. He was seen at the [facility] and given Flonase which she has been using but states there is no relief. Denies fever. Denies cough. Denies shortness of breath or chest pain. Endorses congestion in his ears, sinus pressure, nasal drainage and postnasal drip that makes him nauseated. No vomiting. No abdominal pain. History of similar presentations in the past and states "antibiotics always work".  The history is provided by the patient.    Past Medical History  Diagnosis Date  . Hypertension   . Coronary artery disease   . Hyperlipidemia    Past Surgical History  Procedure Laterality Date  . Cardiac catheterization    . Coronary angioplasty    . Tonsillectomy     No family history on file. Social History  Substance Use Topics  . Smoking status: Never Smoker   . Smokeless tobacco: Never Used  . Alcohol Use: No    Review of Systems  Constitutional: Negative for fever, activity change, appetite change and fatigue.  HENT: Positive for congestion, facial swelling, postnasal drip, rhinorrhea and sinus pressure. Negative for ear discharge.   Eyes: Negative for visual disturbance.  Respiratory: Negative for cough and chest tightness.   Cardiovascular: Negative for chest pain.  Gastrointestinal: Negative for nausea, vomiting and abdominal pain.  Genitourinary: Negative for decreased urine volume.  Musculoskeletal: Negative for myalgias and arthralgias.  Skin: Negative for wound.  Neurological: Negative for dizziness, weakness and headaches.  A complete 10 system review of systems was obtained and all systems are  negative except as noted in the HPI and PMH.      Allergies  Review of patient's allergies indicates no known allergies.  Home Medications   Prior to Admission medications   Medication Sig Start Date End Date Taking? Authorizing Provider  amLODipine (NORVASC) 10 MG tablet Take 5 mg by mouth daily.    Yes Historical Provider, MD  aspirin EC 81 MG tablet Take 81 mg by mouth daily.     Yes Historical Provider, MD  chlordiazePOXIDE (LIBRIUM) 10 MG capsule Take 10 mg by mouth at bedtime. Takes daily.  Can increase to twice daily if needed.   Yes Historical Provider, MD  fluticasone (FLOVENT HFA) 44 MCG/ACT inhaler Inhale into the lungs 2 (two) times daily.   Yes Historical Provider, MD  hydrochlorothiazide (HYDRODIURIL) 50 MG tablet Take 50 mg by mouth.     Yes Historical Provider, MD  lisinopril (PRINIVIL,ZESTRIL) 20 MG tablet Take 10 mg by mouth daily.    Yes Historical Provider, MD  metoprolol (TOPROL-XL) 50 MG 24 hr tablet Take 50 mg by mouth 2 (two) times daily.     Yes Historical Provider, MD  niacin 500 MG tablet Take 500 mg by mouth at bedtime.    Yes Historical Provider, MD  nitroGLYCERIN (NITROSTAT) 0.4 MG SL tablet Place 0.4 mg under the tongue every 5 (five) minutes as needed. For chest pain    Yes Historical Provider, MD  polyethylene glycol (MIRALAX / GLYCOLAX) packet Take 17 g by mouth daily.     Yes Historical Provider, MD  potassium chloride (KLOR-CON) 8 MEQ CR tablet Take 8 mEq by mouth daily.  Yes Historical Provider, MD  rosuvastatin (CRESTOR) 40 MG tablet Take 20 mg by mouth daily.     Yes Historical Provider, MD  travoprost, benzalkonium, (TRAVATAN) 0.004 % ophthalmic solution Place 1 drop into both eyes at bedtime.     Yes Historical Provider, MD  amoxicillin (AMOXIL) 500 MG capsule Take 1 capsule (500 mg total) by mouth 3 (three) times daily. 09/05/15   Glynn Octave, MD   BP 129/82 mmHg  Pulse 92  Temp(Src) 98 F (36.7 C) (Oral)  Resp 20  Ht 6' 3.5" (1.918 m)   Wt 206 lb (93.441 kg)  BMI 25.40 kg/m2  SpO2 98% Physical Exam  Constitutional: He is oriented to person, place, and time. He appears well-developed and well-nourished. No distress.  HENT:  Head: Normocephalic and atraumatic.  Mouth/Throat: Oropharynx is clear and moist. No oropharyngeal exudate.  Nasal congestion Mild maxillary sinus tenderness. Oropharynx is clear.  Eyes: Conjunctivae and EOM are normal. Pupils are equal, round, and reactive to light.  Arcus senilis present bilaterally  Neck: Normal range of motion. Neck supple.  No meningismus.  Cardiovascular: Normal rate, regular rhythm, normal heart sounds and intact distal pulses.   No murmur heard. Pulmonary/Chest: Effort normal and breath sounds normal. No respiratory distress.  Abdominal: Soft. There is no tenderness. There is no rebound and no guarding.  Musculoskeletal: Normal range of motion. He exhibits no edema or tenderness.  Neurological: He is alert and oriented to person, place, and time. No cranial nerve deficit. He exhibits normal muscle tone. Coordination normal.  No ataxia on finger to nose bilaterally. No pronator drift. 5/5 strength throughout. CN 2-12 intact.Equal grip strength. Sensation intact.   Skin: Skin is warm.  Psychiatric: He has a normal mood and affect. His behavior is normal.  Nursing note and vitals reviewed.   ED Course  Procedures (including critical care time) Labs Review Labs Reviewed - No data to display  Imaging Review No results found. I have personally reviewed and evaluated these images and lab results as part of my medical decision-making.   EKG Interpretation None      MDM   Final diagnoses:  Post-nasal drip  Sinusitis, unspecified chronicity, unspecified location   sinus congestion, postnasal drip for the past one week. Patient is well-appearing. No chest pain or shortness of breath. No fever.  Suspect viral sinusitis but he states antibiotics always work.  he will be  given prescription for antibiotics. Continue supportive care with nasal decongestants and nasal steroids. Follow up with your doctor at the [place]. Return precautions discussed.   Glynn Octave, MD 09/05/15 [phone]

## 2015-10-17 ENCOUNTER — Emergency Department (HOSPITAL_COMMUNITY)
Admission: EM | Admit: 2015-10-17 | Discharge: 2015-10-17 | Disposition: A | Discharge status: Home / Self Care | Payer: Medicare Other | Attending: Emergency Medicine | Admitting: Emergency Medicine

## 2015-10-17 ENCOUNTER — Encounter (HOSPITAL_COMMUNITY): Payer: Self-pay | Admitting: *Deleted

## 2015-10-17 DIAGNOSIS — E785 Hyperlipidemia, unspecified: Secondary | ICD-10-CM | POA: Diagnosis not present

## 2015-10-17 DIAGNOSIS — R110 Nausea: Secondary | ICD-10-CM | POA: Insufficient documentation

## 2015-10-17 DIAGNOSIS — I10 Essential (primary) hypertension: Secondary | ICD-10-CM | POA: Diagnosis not present

## 2015-10-17 DIAGNOSIS — J329 Chronic sinusitis, unspecified: Secondary | ICD-10-CM | POA: Insufficient documentation

## 2015-10-17 DIAGNOSIS — R0981 Nasal congestion: Secondary | ICD-10-CM | POA: Diagnosis present

## 2015-10-17 DIAGNOSIS — I2510 Atherosclerotic heart disease of native coronary artery without angina pectoris: Secondary | ICD-10-CM | POA: Insufficient documentation

## 2015-10-17 MED ORDER — AMOXICILLIN-POT CLAVULANATE 875-125 MG PO TABS: 1.0000 | ORAL_TABLET | Freq: Two times a day (BID) | ORAL | Status: DC

## 2015-10-17 NOTE — ED Notes (Addendum)
Pt reports sinus drainage that is making his stomach hurt and he is having nausea. Pt states he was given a nose spray at the [facility] on Thursday but "it's not working." Pt reports coughing up yellow phlegm.

## 2015-10-17 NOTE — ED Provider Notes (Signed)
TIME SEEN: 5:05 AM  CHIEF COMPLAINT: Chronic sinusitis  HPI: Pt is a 82 y.o. male with history of hypertension, hyperlipidemia, CAD who presents emergency department with intermittent episodes of sinus drainage where he will cough up drainage from the back of his throat that appears yellow for the past 5-6 years. Reports his been worse over the past several weeks. He is artery on Zyrtec and takes with medicine. Was seen in the emergency department in [place] on 4/6/7 was given Flonase. Reports that he feels the drainage going into his stomach which causes him to be nauseous. He has not had any fever. No headache, neck pain or neck stiffness. No chest pain or shortness of breath. He reports that when he has had this intermittently over the years his PCP Dr. Sudie BaileyKnowlton will prescribe him Augmentin which he feels is the only thing that has ever helped him before.  ROS: See HPI Constitutional: no fever  Eyes: no drainage  ENT: no runny nose   Cardiovascular:  no chest pain  Resp: no SOB  GI: no vomiting GU: no dysuria Integumentary: no rash  Allergy: no hives  Musculoskeletal: no leg swelling  Neurological: no slurred speech ROS otherwise negative  PAST MEDICAL HISTORY/PAST SURGICAL HISTORY:  Past Medical History  Diagnosis Date  . Hypertension   . Coronary artery disease   . Hyperlipidemia     MEDICATIONS:  Prior to Admission medications   Medication Sig Start Date End Date Taking? Authorizing Provider  amLODipine (NORVASC) 10 MG tablet Take 5 mg by mouth daily.     Historical Provider, MD  amoxicillin (AMOXIL) 500 MG capsule Take 1 capsule (500 mg total) by mouth 3 (three) times daily. 09/05/15   Glynn OctaveStephen Rancour, MD  aspirin EC 81 MG tablet Take 81 mg by mouth daily.      Historical Provider, MD  chlordiazePOXIDE (LIBRIUM) 10 MG capsule Take 10 mg by mouth at bedtime. Takes daily.  Can increase to twice daily if needed.    Historical Provider, MD  fluticasone (FLOVENT HFA) 44  MCG/ACT inhaler Inhale into the lungs 2 (two) times daily.    Historical Provider, MD  hydrochlorothiazide (HYDRODIURIL) 50 MG tablet Take 50 mg by mouth.      Historical Provider, MD  lisinopril (PRINIVIL,ZESTRIL) 20 MG tablet Take 10 mg by mouth daily.     Historical Provider, MD  metoprolol (TOPROL-XL) 50 MG 24 hr tablet Take 50 mg by mouth 2 (two) times daily.      Historical Provider, MD  niacin 500 MG tablet Take 500 mg by mouth at bedtime.     Historical Provider, MD  nitroGLYCERIN (NITROSTAT) 0.4 MG SL tablet Place 0.4 mg under the tongue every 5 (five) minutes as needed. For chest pain     Historical Provider, MD  polyethylene glycol (MIRALAX / GLYCOLAX) packet Take 17 g by mouth daily.      Historical Provider, MD  potassium chloride (KLOR-CON) 8 MEQ CR tablet Take 8 mEq by mouth daily.      Historical Provider, MD  rosuvastatin (CRESTOR) 40 MG tablet Take 20 mg by mouth daily.      Historical Provider, MD  travoprost, benzalkonium, (TRAVATAN) 0.004 % ophthalmic solution Place 1 drop into both eyes at bedtime.      Historical Provider, MD    ALLERGIES:  No Known Allergies  SOCIAL HISTORY:  Social History  Substance Use Topics  . Smoking status: Never Smoker   . Smokeless tobacco: Never Used  .  Alcohol Use: No    FAMILY HISTORY: History reviewed. No pertinent family history.  EXAM: BP 151/75 mmHg  Pulse 85  Temp(Src) 97.9 F (36.6 C) (Oral)  Resp 18  Ht  (1.93 m)  Wt 196 lb (88.905 kg)  BMI 23.87 kg/m2  SpO2 97% CONSTITUTIONAL: Alert and oriented and responds appropriately to questions. Well-appearing; well-nourished, appears younger than stated age, smiling, pleasant, afebrile, nontoxic HEAD: Normocephalic EYES: Conjunctivae clear, PERRL ENT: normal nose; no rhinorrhea; moist mucous membranes; No pharyngeal erythema or petechiae, status post tonsillectomy, no uvular deviation, no trismus or drooling, normal phonation, no stridor, no dental caries or abscess  noted, no Ludwig's angina, tongue sits flat in the bottom of the mouth; patient does have some yellow sinus drainage noted in the posterior oropharynx NECK: Supple, no meningismus, no LAD  CARD: RRR; S1 and S2 appreciated; no murmurs, no clicks, no rubs, no gallops RESP: Normal chest excursion without splinting or tachypnea; breath sounds clear and equal bilaterally; no wheezes, no rhonchi, no rales, no hypoxia or respiratory distress, speaking full sentences ABD/GI: Normal bowel sounds; non-distended; soft, non-tender, no rebound, no guarding, no peritoneal signs BACK:  The back appears normal and is non-tender to palpation, there is no CVA tenderness EXT: Normal ROM in all joints; non-tender to palpation; no edema; normal capillary refill; no cyanosis, no calf tenderness or swelling    SKIN: Normal color for age and race; warm; no rash NEURO: Moves all extremities equally, sensation to light touch intact diffusely, cranial nerves II through XII intact PSYCH: The patient's mood and manner are appropriate. Grooming and personal hygiene are appropriate.  MEDICAL DECISION MAKING: Patient here with sinus drainage, chronic sinusitis. He is artery on Zyrtec and Flonase. Have advised him to continue these medications. He reports Augmentin has helped in the past. I do not feel it is unreasonable given patient has had symptoms for over 2 weeks to put him on a course of antibiotics to see if this is helpful. He does have a PCP to follow-up with, Dr. Sudie Bailey. He denies that he is coughing anything deep from his longest states he is just coughing it up from the back of his throat when it is draining. His lungs are clear to auscultation currently and he is not hypoxic and has no increased work of breathing, respiratory distress. He denies any chest pain or shortness of breath. No signs of pneumonia on exam. No signs of meningitis. I feel he is safe to be discharged home.   At this time, I do not feel there is any  life-threatening condition present. I have reviewed and discussed all results (EKG, imaging, lab, urine as appropriate), exam findings with patient. I have reviewed nursing notes and appropriate previous records.  I feel the patient is safe to be discharged home without further emergent workup. Discussed usual and customary return precautions. Patient and family (if present) verbalize understanding and are comfortable with this plan.  Patient will follow-up with their primary care provider. If they do not have a primary care provider, information for follow-up has been provided to them. All questions have been answered.    Layla Maw Ward, DO 10/17/15 [phone]

## 2015-10-17 NOTE — Discharge Instructions (Signed)
Please continue your Zyrtec and Flonase nasal spray. Please follow-up with your primary care physician.   Sinusitis, Adult Sinusitis is redness, soreness, and inflammation of the paranasal sinuses. Paranasal sinuses are air pockets within the bones of your face. They are located beneath your eyes, in the middle of your forehead, and above your eyes. In healthy paranasal sinuses, mucus is able to drain out, and air is able to circulate through them by way of your nose. However, when your paranasal sinuses are inflamed, mucus and air can become trapped. This can allow bacteria and other germs to grow and cause infection. Sinusitis can develop quickly and last only a short time (acute) or continue over a long period (chronic). Sinusitis that lasts for more than 12 weeks is considered chronic. CAUSES Causes of sinusitis include:  Allergies.  Structural abnormalities, such as displacement of the cartilage that separates your nostrils (deviated septum), which can decrease the air flow through your nose and sinuses and affect sinus drainage.  Functional abnormalities, such as when the small hairs (cilia) that line your sinuses and help remove mucus do not work properly or are not present. SIGNS AND SYMPTOMS Symptoms of acute and chronic sinusitis are the same. The primary symptoms are pain and pressure around the affected sinuses. Other symptoms include:  Upper toothache.  Earache.  Headache.  Bad breath.  Decreased sense of smell and taste.  A cough, which worsens when you are lying flat.  Fatigue.  Fever.  Thick drainage from your nose, which often is green and may contain pus (purulent).  Swelling and warmth over the affected sinuses. DIAGNOSIS Your health care provider will perform a physical exam. During your exam, your health care provider may perform any of the following to help determine if you have acute sinusitis or chronic sinusitis:  Look in your nose for signs of abnormal  growths in your nostrils (nasal polyps).  Tap over the affected sinus to check for signs of infection.  View the inside of your sinuses using an imaging device that has a light attached (endoscope). If your health care provider suspects that you have chronic sinusitis, one or more of the following tests may be recommended:  Allergy tests.  Nasal culture. A sample of mucus is taken from your nose, sent to a lab, and screened for bacteria.  Nasal cytology. A sample of mucus is taken from your nose and examined by your health care provider to determine if your sinusitis is related to an allergy. TREATMENT Most cases of acute sinusitis are related to a viral infection and will resolve on their own within 10 days. Sometimes, medicines are prescribed to help relieve symptoms of both acute and chronic sinusitis. These may include pain medicines, decongestants, nasal steroid sprays, or saline sprays. However, for sinusitis related to a bacterial infection, your health care provider will prescribe antibiotic medicines. These are medicines that will help kill the bacteria causing the infection. Rarely, sinusitis is caused by a fungal infection. In these cases, your health care provider will prescribe antifungal medicine. For some cases of chronic sinusitis, surgery is needed. Generally, these are cases in which sinusitis recurs more than 3 times per year, despite other treatments. HOME CARE INSTRUCTIONS  Drink plenty of water. Water helps thin the mucus so your sinuses can drain more easily.  Use a humidifier.  Inhale steam 3-4 times a day (for example, sit in the bathroom with the shower running).  Apply a warm, moist washcloth to your face 3-4 times  a day, or as directed by your health care provider.  Use saline nasal sprays to help moisten and clean your sinuses.  Take medicines only as directed by your health care provider.  If you were prescribed either an antibiotic or antifungal medicine,  finish it all even if you start to feel better. SEEK IMMEDIATE MEDICAL CARE IF:  You have increasing pain or severe headaches.  You have nausea, vomiting, or drowsiness.  You have swelling around your face.  You have vision problems.  You have a stiff neck.  You have difficulty breathing.   This information is not intended to replace advice given to you by your health care provider. Make sure you discuss any questions you have with your health care provider.   Document Released: 06/27/2005 Document Revised: 07/18/2014 Document Reviewed: 07/12/2011 Elsevier Interactive Patient Education [employer]2016 [employer]

## 2015-11-05 ENCOUNTER — Emergency Department (HOSPITAL_COMMUNITY)
Admission: EM | Admit: 2015-11-05 | Discharge: 2015-11-05 | Disposition: A | Discharge status: Home / Self Care | Payer: Medicare Other | Attending: Emergency Medicine | Admitting: Emergency Medicine

## 2015-11-05 ENCOUNTER — Encounter (HOSPITAL_COMMUNITY): Payer: Self-pay | Admitting: Emergency Medicine

## 2015-11-05 DIAGNOSIS — I10 Essential (primary) hypertension: Secondary | ICD-10-CM | POA: Insufficient documentation

## 2015-11-05 DIAGNOSIS — E785 Hyperlipidemia, unspecified: Secondary | ICD-10-CM | POA: Insufficient documentation

## 2015-11-05 DIAGNOSIS — R69 Illness, unspecified: Secondary | ICD-10-CM | POA: Diagnosis present

## 2015-11-05 DIAGNOSIS — Z7982 Long term (current) use of aspirin: Secondary | ICD-10-CM | POA: Diagnosis not present

## 2015-11-05 DIAGNOSIS — J329 Chronic sinusitis, unspecified: Secondary | ICD-10-CM | POA: Diagnosis not present

## 2015-11-05 DIAGNOSIS — Z79899 Other long term (current) drug therapy: Secondary | ICD-10-CM | POA: Insufficient documentation

## 2015-11-05 DIAGNOSIS — I2510 Atherosclerotic heart disease of native coronary artery without angina pectoris: Secondary | ICD-10-CM | POA: Diagnosis not present

## 2015-11-05 MED ORDER — AMOXICILLIN 500 MG PO CAPS: 500.0000 mg | ORAL_CAPSULE | Freq: Two times a day (BID) | ORAL | Status: DC

## 2015-11-05 MED ORDER — AMOXICILLIN 250 MG PO CAPS
500.0000 mg | ORAL_CAPSULE | Freq: Once | ORAL | Status: AC
  Administered 2015-11-05: 500 mg via ORAL
  Filled 2015-11-05: qty 2

## 2015-11-05 NOTE — ED Notes (Signed)
Pt states he has been having a lot of sinus drainage with a bad taste in his mouth and nausea.  Has been tx with several antibiotics, prednisone, zyrtec, and flonase with no relief.  Pt states he has had this problem off and on for years.

## 2015-11-05 NOTE — ED Provider Notes (Signed)
CSN: [id number]     Arrival date & time 11/05/15  1801 History  By signing my name below, I, Tanda RockersMargaux Venter, attest that this documentation has been prepared under the direction and in the presence of Rolland PorterMark Jacquelin Krajewski, MD. Electronically Signed: Tanda RockersMargaux Venter, ED Scribe. 11/05/2015. 9:22 PM.   Chief Complaint  Patient presents with  . Illness   The history is provided by the patient. No language interpreter was used.     HPI Comments: Samuel Wilkerson is a 82 y.o. male who presents to the Emergency Department complaining of gradual onset, constant, sinus drainage x 1 week. Pt has hx of chronic sinusitis and reports antibiotics are the only thing that works. He was seen at the [facility] 1 week ago and was prescribed Prednisone without relief. Denies sinus pain, nausea, vomiting, or any other   Past Medical History  Diagnosis Date  . Hypertension   . Coronary artery disease   . Hyperlipidemia    Past Surgical History  Procedure Laterality Date  . Cardiac catheterization    . Coronary angioplasty    . Tonsillectomy     History reviewed. No pertinent family history. Social History  Substance Use Topics  . Smoking status: Never Smoker   . Smokeless tobacco: Never Used  . Alcohol Use: No    Review of Systems  Constitutional: Negative for fever, chills, diaphoresis, appetite change and fatigue.  HENT: Positive for postnasal drip. Negative for mouth sores, sore throat and trouble swallowing.   Eyes: Negative for visual disturbance.  Respiratory: Negative for cough, chest tightness, shortness of breath and wheezing.   Cardiovascular: Negative for chest pain.  Gastrointestinal: Negative for nausea, vomiting, abdominal pain, diarrhea and abdominal distention.  Endocrine: Negative for polydipsia, polyphagia and polyuria.  Genitourinary: Negative for dysuria, frequency and hematuria.  Musculoskeletal: Negative for gait problem.  Skin: Negative for color change, pallor and rash.  Neurological:  Negative for dizziness, syncope, light-headedness and headaches.  Hematological: Does not bruise/bleed easily.  Psychiatric/Behavioral: Negative for behavioral problems and confusion.    Allergies  Review of patient's allergies indicates no known allergies.  Home Medications   Prior to Admission medications   Medication Sig Start Date End Date Taking? Authorizing Provider  aspirin EC 81 MG tablet Take 81 mg by mouth at bedtime.    Yes Historical Provider, MD  metoprolol (TOPROL-XL) 50 MG 24 hr tablet Take 50 mg by mouth 2 (two) times daily.     Yes Historical Provider, MD  nitroGLYCERIN (NITROSTAT) 0.4 MG SL tablet Place 0.4 mg under the tongue every 5 (five) minutes as needed. For chest pain    Yes Historical Provider, MD  ondansetron (ZOFRAN) 8 MG tablet Take 4-8 mg by mouth every 8 (eight) hours as needed for nausea or vomiting.   Yes Historical Provider, MD  polyethylene glycol (MIRALAX / GLYCOLAX) packet Take 17 g by mouth daily.     Yes Historical Provider, MD  predniSONE (DELTASONE) 5 MG tablet Take 5-30 mg by mouth See admin instructions. 6,5,4,3,2,1 as directed starting on 10/30/15   Yes Historical Provider, MD  travoprost, benzalkonium, (TRAVATAN) 0.004 % ophthalmic solution Place 1 drop into both eyes at bedtime.     Yes Historical Provider, MD  amLODipine (NORVASC) 10 MG tablet Take 5 mg by mouth daily.     Historical Provider, MD  amoxicillin (AMOXIL) 500 MG capsule Take 1 capsule (500 mg total) by mouth 2 (two) times daily. 11/05/15   Rolland PorterMark Kenneth Cuaresma, MD  amoxicillin-clavulanate (AUGMENTIN) [phone]  MG tablet Take 1 tablet by mouth every 12 (twelve) hours. Patient not taking: Reported on 11/05/2015 10/17/15   Kristen N Ward, DO  chlordiazePOXIDE (LIBRIUM) 10 MG capsule Take 10 mg by mouth at bedtime. Takes daily.  Can increase to twice daily if needed.    Historical Provider, MD  fluticasone (FLOVENT HFA) 44 MCG/ACT inhaler Inhale into the lungs 2 (two) times daily.    Historical Provider,  MD  hydrochlorothiazide (HYDRODIURIL) 50 MG tablet Take 50 mg by mouth.      Historical Provider, MD  lisinopril (PRINIVIL,ZESTRIL) 20 MG tablet Take 10 mg by mouth daily.     Historical Provider, MD  niacin 500 MG tablet Take 500 mg by mouth at bedtime.     Historical Provider, MD  potassium chloride (KLOR-CON) 8 MEQ CR tablet Take 8 mEq by mouth daily.      Historical Provider, MD  rosuvastatin (CRESTOR) 40 MG tablet Take 20 mg by mouth daily.      Historical Provider, MD   BP 146/83 mmHg  Pulse 90  Temp(Src) 97.8 F (36.6 C) (Oral)  Resp 16  Ht  (1.93 m)  Wt 196 lb (88.905 kg)  BMI 23.87 kg/m2  SpO2 98%   Physical Exam  Constitutional: He is oriented to person, place, and time. He appears well-developed and well-nourished. No distress.  HENT:  Head: Normocephalic.  Mouth/Throat: Oropharynx is clear and moist.  Eyes: Conjunctivae are normal. Pupils are equal, round, and reactive to light. No scleral icterus.  Neck: Normal range of motion. Neck supple. No thyromegaly present.  Cardiovascular: Normal rate and regular rhythm.  Exam reveals no gallop and no friction rub.   No murmur heard. Pulmonary/Chest: Effort normal and breath sounds normal. No respiratory distress. He has no wheezes. He has no rales.  Abdominal: Soft. Bowel sounds are normal. He exhibits no distension. There is no tenderness. There is no rebound.  Musculoskeletal: Normal range of motion.  Neurological: He is alert and oriented to person, place, and time.  Skin: Skin is warm and dry. No rash noted.  Psychiatric: He has a normal mood and affect. His behavior is normal.    ED Course  Procedures (including critical care time)  DIAGNOSTIC STUDIES: Oxygen Saturation is 98% on RA, normal by my interpretation.    COORDINATION OF CARE:   Labs Review Labs Reviewed - No data to display  Imaging Review No results found. I have personally reviewed and evaluated these images and lab results as part of my  medical decision-making.   EKG Interpretation None      MDM   Final diagnoses:  Chronic sinusitis, unspecified location    I personally performed the services described in this documentation, which was scribed in my presence. The recorded information has been reviewed and is accurate.      Rolland Porter, MD 11/05/15 2133

## 2015-11-05 NOTE — Discharge Instructions (Signed)

## 2015-11-05 NOTE — ED Notes (Signed)
Pt alert & oriented x4, stable gait. Patient given discharge instructions, paperwork & prescription(s). Patient  instructed to stop at the registration desk to finish any additional paperwork. Patient verbalized understanding. Pt left department w/ no further questions. 

## 2015-11-05 NOTE — ED Notes (Signed)
Found pt medication in the room. Called & left message for pt to call the ED when he returns home.

## 2015-11-08 ENCOUNTER — Encounter (HOSPITAL_COMMUNITY): Payer: Self-pay | Admitting: *Deleted

## 2015-11-08 ENCOUNTER — Emergency Department (HOSPITAL_COMMUNITY)
Admission: EM | Admit: 2015-11-08 | Discharge: 2015-11-08 | Disposition: A | Discharge status: Home / Self Care | Payer: Medicare Other | Attending: Emergency Medicine | Admitting: Emergency Medicine

## 2015-11-08 DIAGNOSIS — I2510 Atherosclerotic heart disease of native coronary artery without angina pectoris: Secondary | ICD-10-CM | POA: Insufficient documentation

## 2015-11-08 DIAGNOSIS — I10 Essential (primary) hypertension: Secondary | ICD-10-CM | POA: Insufficient documentation

## 2015-11-08 DIAGNOSIS — Z79899 Other long term (current) drug therapy: Secondary | ICD-10-CM | POA: Insufficient documentation

## 2015-11-08 DIAGNOSIS — E785 Hyperlipidemia, unspecified: Secondary | ICD-10-CM | POA: Diagnosis not present

## 2015-11-08 DIAGNOSIS — R197 Diarrhea, unspecified: Secondary | ICD-10-CM

## 2015-11-08 LAB — CBC WITH DIFFERENTIAL/PLATELET
BASOS ABS: 0 10*3/uL (ref 0.0–0.1)
BASOS PCT: 0 %
EOS PCT: 0 %
Eosinophils Absolute: 0 10*3/uL (ref 0.0–0.7)
HCT: 40.4 % (ref 39.0–52.0)
Hemoglobin: 13.7 g/dL (ref 13.0–17.0)
LYMPHS PCT: 10 %
Lymphs Abs: 0.6 10*3/uL — ABNORMAL LOW (ref 0.7–4.0)
MCH: 30.7 pg (ref 26.0–34.0)
MCHC: 33.9 g/dL (ref 30.0–36.0)
MCV: 90.6 fL (ref 78.0–100.0)
Monocytes Absolute: 0.9 10*3/uL (ref 0.1–1.0)
Monocytes Relative: 14 %
Neutro Abs: 4.8 10*3/uL (ref 1.7–7.7)
Neutrophils Relative %: 76 %
Platelets: 140 10*3/uL — ABNORMAL LOW (ref 150–400)
RBC: 4.46 MIL/uL (ref 4.22–5.81)
RDW: 14.6 % (ref 11.5–15.5)
WBC: 6.3 10*3/uL (ref 4.0–10.5)

## 2015-11-08 LAB — COMPREHENSIVE METABOLIC PANEL
ALT: 31 U/L (ref 17–63)
ANION GAP: 9 (ref 5–15)
AST: 30 U/L (ref 15–41)
Albumin: 3.7 g/dL (ref 3.5–5.0)
Alkaline Phosphatase: 70 U/L (ref 38–126)
BILIRUBIN TOTAL: 0.7 mg/dL (ref 0.3–1.2)
BUN: 22 mg/dL — AB (ref 6–20)
CO2: 23 mmol/L (ref 22–32)
Calcium: 8.3 mg/dL — ABNORMAL LOW (ref 8.9–10.3)
Chloride: 101 mmol/L (ref 101–111)
Creatinine, Ser: 1.41 mg/dL — ABNORMAL HIGH (ref 0.61–1.24)
GFR calc Af Amer: 52 mL/min — ABNORMAL LOW (ref >60)
GFR, EST NON AFRICAN AMERICAN: 45 mL/min — AB (ref >60)
Glucose, Bld: 100 mg/dL — ABNORMAL HIGH (ref 65–99)
POTASSIUM: 4 mmol/L (ref 3.5–5.1)
Sodium: 133 mmol/L — ABNORMAL LOW (ref 135–145)
TOTAL PROTEIN: 6.5 g/dL (ref 6.5–8.1)

## 2015-11-08 MED ORDER — SODIUM CHLORIDE 0.9 % IV BOLUS (SEPSIS)
1000.0000 mL | Freq: Once | INTRAVENOUS | Status: AC
Start: 2015-11-08 — End: 2015-11-08
  Administered 2015-11-08: 1000 mL via INTRAVENOUS

## 2015-11-08 NOTE — ED Notes (Signed)
Dr Lynelle DoctorKnapp ion to assess pt

## 2015-11-08 NOTE — ED Notes (Signed)
Pt reports that he took a dose of imodium prior to arrival in er,

## 2015-11-08 NOTE — ED Provider Notes (Signed)
CSN: [id number]     Arrival date & time 11/08/15  0203 History   First MD Initiated Contact with Patient 11/08/15 0230  AM   Chief Complaint  Patient presents with  . Diarrhea     (Consider location/radiation/quality/duration/timing/severity/associated sxs/prior Treatment) HPI patient reports she was seen in the ED on April 27 and was started on amoxicillin for sinus problem. He states he's taken the antibiotic 2 days. He states about 10:30 PM he started having diarrhea. He states it was initially loose and now it's watery. He denies any abdominal pain, nausea, vomiting, or fever. He states he feels like there something in his lower abdomen like movement but he denies pain. He denies feeling dizzy or lightheadedness. Patient drove himself to the ED. He states he feels like he needs some IV fluids. No one else in the house is sick. Patient took 2 Imodium just prior to arrival in the ED.   PCP Dr Sudie Bailey  Past Medical History  Diagnosis Date  . Hypertension   . Coronary artery disease   . Hyperlipidemia    Past Surgical History  Procedure Laterality Date  . Cardiac catheterization    . Coronary angioplasty    . Tonsillectomy     No family history on file. Social History  Substance Use Topics  . Smoking status: Never Smoker   . Smokeless tobacco: Never Used  . Alcohol Use: No  lives with his neice  Review of Systems  All other systems reviewed and are negative.     Allergies  Review of patient's allergies indicates no known allergies.  Home Medications   Prior to Admission medications   Medication Sig Start Date End Date Taking? Authorizing Provider  amLODipine (NORVASC) 10 MG tablet Take 5 mg by mouth daily.     Historical Provider, MD  amoxicillin (AMOXIL) 500 MG capsule Take 1 capsule (500 mg total) by mouth 2 (two) times daily. 11/05/15   Rolland Porter, MD  amoxicillin-clavulanate (AUGMENTIN) 875-125 MG tablet Take 1 tablet by mouth every 12 (twelve) hours. Patient  not taking: Reported on 11/05/2015 10/17/15   Layla Maw Ward, DO  aspirin EC 81 MG tablet Take 81 mg by mouth at bedtime.     Historical Provider, MD  chlordiazePOXIDE (LIBRIUM) 10 MG capsule Take 10 mg by mouth at bedtime. Takes daily.  Can increase to twice daily if needed.    Historical Provider, MD  fluticasone (FLOVENT HFA) 44 MCG/ACT inhaler Inhale into the lungs 2 (two) times daily.    Historical Provider, MD  hydrochlorothiazide (HYDRODIURIL) 50 MG tablet Take 50 mg by mouth.      Historical Provider, MD  lisinopril (PRINIVIL,ZESTRIL) 20 MG tablet Take 10 mg by mouth daily.     Historical Provider, MD  metoprolol (TOPROL-XL) 50 MG 24 hr tablet Take 50 mg by mouth 2 (two) times daily.      Historical Provider, MD  niacin 500 MG tablet Take 500 mg by mouth at bedtime.     Historical Provider, MD  nitroGLYCERIN (NITROSTAT) 0.4 MG SL tablet Place 0.4 mg under the tongue every 5 (five) minutes as needed. For chest pain     Historical Provider, MD  ondansetron (ZOFRAN) 8 MG tablet Take 4-8 mg by mouth every 8 (eight) hours as needed for nausea or vomiting.    Historical Provider, MD  polyethylene glycol (MIRALAX / GLYCOLAX) packet Take 17 g by mouth daily.      Historical Provider, MD  potassium chloride (KLOR-CON) 8 MEQ  CR tablet Take 8 mEq by mouth daily.      Historical Provider, MD  predniSONE (DELTASONE) 5 MG tablet Take 5-30 mg by mouth See admin instructions. 6,5,4,3,2,1 as directed starting on 10/30/15    Historical Provider, MD  rosuvastatin (CRESTOR) 40 MG tablet Take 20 mg by mouth daily.      Historical Provider, MD  travoprost, benzalkonium, (TRAVATAN) 0.004 % ophthalmic solution Place 1 drop into both eyes at bedtime.      Historical Provider, MD   BP 137/79 mmHg  Pulse 90  Temp(Src) 98 F (36.7 C) (Oral)  Resp 18  Ht 6\' 4"  (1.93 m)  Wt 196 lb (88.905 kg)  BMI 23.87 kg/m2  SpO2 98%  Vital signs normal   Physical Exam  Constitutional: He is oriented to person, place, and time.  He appears well-developed and well-nourished.  Non-toxic appearance. He does not appear ill. No distress.  HENT:  Head: Normocephalic and atraumatic.  Right Ear: External ear normal.  Left Ear: External ear normal.  Nose: Nose normal. No mucosal edema or rhinorrhea.  Mouth/Throat: Oropharynx is clear and moist and mucous membranes are normal. No dental abscesses or uvula swelling.  Eyes: Conjunctivae and EOM are normal. Pupils are equal, round, and reactive to light.  Neck: Normal range of motion and full passive range of motion without pain. Neck supple.  Cardiovascular: Normal rate, regular rhythm and normal heart sounds.  Exam reveals no gallop and no friction rub.   No murmur heard. Pulmonary/Chest: Effort normal and breath sounds normal. No respiratory distress. He has no wheezes. He has no rhonchi. He has no rales. He exhibits no tenderness and no crepitus.  Abdominal: Soft. Normal appearance and bowel sounds are normal. He exhibits no distension. There is no tenderness. There is no rebound and no guarding.  Musculoskeletal: Normal range of motion. He exhibits no edema or tenderness.  Moves all extremities well.   Neurological: He is alert and oriented to person, place, and time. He has normal strength. No cranial nerve deficit.  Skin: Skin is warm, dry and intact. No rash noted. No erythema. No pallor.  Psychiatric: He has a normal mood and affect. His speech is normal and behavior is normal. His mood appears not anxious.  Nursing note and vitals reviewed.   ED Course  Procedures (including critical care time)  Medications  sodium chloride 0.9 % bolus 1,000 mL (1,000 mLs Intravenous New Bag/Given 11/08/15 0303)   Patient was given IV fluids.  Recheck at 5:30 AM patient has had no further diarrhea while in the ED. He is having good urinary output. He states he's feeling better. Patient was discharged home. He was advised to continue the Imodium for the diarrhea, he can also eat  yogurt while he's on antibiotics. He should be rechecked if he gets a fever,  abdominal pain, or worsening diarrhea.    Labs Review Results for orders placed or performed during the hospital encounter of 11/08/15  Comprehensive metabolic panel  Result Value Ref Range   Sodium 133 (L) 135 - 145 mmol/L   Potassium 4.0 3.5 - 5.1 mmol/L   Chloride 101 101 - 111 mmol/L   CO2 23 22 - 32 mmol/L   Glucose, Bld 100 (H) 65 - 99 mg/dL   BUN 22 (H) 6 - 20 mg/dL   Creatinine, Ser 2.951.41 (H) 0.61 - 1.24 mg/dL   Calcium 8.3 (L) 8.9 - 10.3 mg/dL   Total Protein 6.5 6.5 - 8.1 g/dL  Albumin 3.7 3.5 - 5.0 g/dL   AST 30 15 - 41 U/L   ALT 31 17 - 63 U/L   Alkaline Phosphatase 70 38 - 126 U/L   Total Bilirubin 0.7 0.3 - 1.2 mg/dL   GFR calc non Af Amer 45 (L) >60 mL/min   GFR calc Af Amer 52 (L) >60 mL/min   Anion gap 9 5 - 15  CBC with Differential  Result Value Ref Range   WBC 6.3 4.0 - 10.5 K/uL   RBC 4.46 4.22 - 5.81 MIL/uL   Hemoglobin 13.7 13.0 - 17.0 g/dL   HCT 40.9 81.1 - 91.4 %   MCV 90.6 78.0 - 100.0 fL   MCH 30.7 26.0 - 34.0 pg   MCHC 33.9 30.0 - 36.0 g/dL   RDW 78.2 95.6 - 21.3 %   Platelets 140 (L) 150 - 400 K/uL   Neutrophils Relative % 76 %   Neutro Abs 4.8 1.7 - 7.7 K/uL   Lymphocytes Relative 10 %   Lymphs Abs 0.6 (L) 0.7 - 4.0 K/uL   Monocytes Relative 14 %   Monocytes Absolute 0.9 0.1 - 1.0 K/uL   Eosinophils Relative 0 %   Eosinophils Absolute 0.0 0.0 - 0.7 K/uL   Basophils Relative 0 %   Basophils Absolute 0.0 0.0 - 0.1 K/uL   Laboratory interpretation all normal    MDM   Final diagnoses:  Diarrhea, unspecified type   Plan discharge  Devoria Albe, MD, Concha Pyo, MD 11/08/15 [phone]

## 2015-11-08 NOTE — Discharge Instructions (Signed)
Drink plenty of fluids to prevent dehydration. Continue the Imodium for your diarrhea. You can eat yogurt to help prevent the diarrhea. Recheck if you get fever, abdominal pain, or the diarrhea gets worse.   Diarrhea Diarrhea is frequent loose and watery bowel movements. It can cause you to feel weak and dehydrated. Dehydration can cause you to become tired and thirsty, have a dry mouth, and have decreased urination that often is dark yellow. Diarrhea is a sign of another problem, most often an infection that will not last long. In most cases, diarrhea typically lasts 2-3 days. However, it can last longer if it is a sign of something more serious. It is important to treat your diarrhea as directed by your caregiver to lessen or prevent future episodes of diarrhea. CAUSES  Some common causes include:  Gastrointestinal infections caused by viruses, bacteria, or parasites.  Food poisoning or food allergies.  Certain medicines, such as antibiotics, chemotherapy, and laxatives.  Artificial sweeteners and fructose.  Digestive disorders. HOME CARE INSTRUCTIONS  Ensure adequate fluid intake (hydration): Have 1 cup (8 oz) of fluid for each diarrhea episode. Avoid fluids that contain simple sugars or sports drinks, fruit juices, whole milk products, and sodas. Your urine should be clear or pale yellow if you are drinking enough fluids. Hydrate with an oral rehydration solution that you can purchase at pharmacies, retail stores, and online. You can prepare an oral rehydration solution at home by mixing the following ingredients together:   - tsp table salt.   tsp baking soda.   tsp salt substitute containing potassium chloride.  1  tablespoons sugar.  1 L (34 oz) of water.  Certain foods and beverages may increase the speed at which food moves through the gastrointestinal (GI) tract. These foods and beverages should be avoided and include:  Caffeinated and alcoholic beverages.  High-fiber  foods, such as raw fruits and vegetables, nuts, seeds, and whole grain breads and cereals.  Foods and beverages sweetened with sugar alcohols, such as xylitol, sorbitol, and mannitol.  Some foods may be well tolerated and may help thicken stool including:  Starchy foods, such as rice, toast, pasta, low-sugar cereal, oatmeal, grits, baked potatoes, crackers, and bagels.  Bananas.  Applesauce.  Add probiotic-rich foods to help increase healthy bacteria in the GI tract, such as yogurt and fermented milk products.  Wash your hands well after each diarrhea episode.  Only take over-the-counter or prescription medicines as directed by your caregiver.  Take a warm bath to relieve any burning or pain from frequent diarrhea episodes. SEEK IMMEDIATE MEDICAL CARE IF:   You are unable to keep fluids down.  You have persistent vomiting.  You have blood in your stool, or your stools are black and tarry.  You do not urinate in 6-8 hours, or there is only a small amount of very dark urine.  You have abdominal pain that increases or localizes.  You have weakness, dizziness, confusion, or light-headedness.  You have a severe headache.  Your diarrhea gets worse or does not get better.  You have a fever or persistent symptoms for more than 2-3 days.  You have a fever and your symptoms suddenly get worse. MAKE SURE YOU:   Understand these instructions.  Will watch your condition.  Will get help right away if you are not doing well or get worse.   This information is not intended to replace advice given to you by your health care provider. Make sure you discuss any questions you  have with your health care provider.   Document Released: 06/17/2002 Document Revised: 07/18/2014 Document Reviewed: 03/04/2012 Elsevier Interactive Patient Education 2016 [employer]  Food Choices to Help Relieve Diarrhea, Adult When you have diarrhea, the foods you eat and your eating habits are very  important. Choosing the right foods and drinks can help relieve diarrhea. Also, because diarrhea can last up to 7 days, you need to replace lost fluids and electrolytes (such as sodium, potassium, and chloride) in order to help prevent dehydration.  WHAT GENERAL GUIDELINES DO I NEED TO FOLLOW?  Slowly drink 1 cup (8 oz) of fluid for each episode of diarrhea. If you are getting enough fluid, your urine will be clear or pale yellow.  Eat starchy foods. Some good choices include white rice, white toast, pasta, low-fiber cereal, baked potatoes (without the skin), saltine crackers, and bagels.  Avoid large servings of any cooked vegetables.  Limit fruit to two servings per day. A serving is  cup or 1 small piece.  Choose foods with less than 2 g of fiber per serving.  Limit fats to less than 8 tsp (38 g) per day.  Avoid fried foods.  Eat foods that have probiotics in them. Probiotics can be found in certain dairy products.  Avoid foods and beverages that may increase the speed at which food moves through the stomach and intestines (gastrointestinal tract). Things to avoid include:  High-fiber foods, such as dried fruit, raw fruits and vegetables, nuts, seeds, and whole grain foods.  Spicy foods and high-fat foods.  Foods and beverages sweetened with high-fructose corn syrup, honey, or sugar alcohols such as xylitol, sorbitol, and mannitol. WHAT FOODS ARE RECOMMENDED? Grains White rice. White, JamaicaFrench, or pita breads (fresh or toasted), including plain rolls, buns, or bagels. White pasta. Saltine, soda, or graham crackers. Pretzels. Low-fiber cereal. Cooked cereals made with water (such as cornmeal, farina, or cream cereals). Plain muffins. Matzo. Melba toast. Zwieback.  Vegetables Potatoes (without the skin). Strained tomato and vegetable juices. Most well-cooked and canned vegetables without seeds. Tender lettuce. Fruits Cooked or canned applesauce, apricots, cherries, fruit cocktail,  grapefruit, peaches, pears, or plums. Fresh bananas, apples without skin, cherries, grapes, cantaloupe, grapefruit, peaches, oranges, or plums.  Meat and Other Protein Products Baked or boiled chicken. Eggs. Tofu. Fish. Seafood. Smooth peanut butter. Ground or well-cooked tender beef, ham, veal, lamb, pork, or poultry.  Dairy Plain yogurt, kefir, and unsweetened liquid yogurt. Lactose-free milk, buttermilk, or soy milk. Plain hard cheese. Beverages Sport drinks. Clear broths. Diluted fruit juices (except prune). Regular, caffeine-free sodas such as ginger ale. Water. Decaffeinated teas. Oral rehydration solutions. Sugar-free beverages not sweetened with sugar alcohols. Other Bouillon, broth, or soups made from recommended foods.  The items listed above may not be a complete list of recommended foods or beverages. Contact your dietitian for more options. WHAT FOODS ARE NOT RECOMMENDED? Grains Whole grain, whole wheat, bran, or rye breads, rolls, pastas, crackers, and cereals. Wild or brown rice. Cereals that contain more than 2 g of fiber per serving. Corn tortillas or taco shells. Cooked or dry oatmeal. Granola. Popcorn. Vegetables Raw vegetables. Cabbage, broccoli, Brussels sprouts, artichokes, baked beans, beet greens, corn, kale, legumes, peas, sweet potatoes, and yams. Potato skins. Cooked spinach and cabbage. Fruits Dried fruit, including raisins and dates. Raw fruits. Stewed or dried prunes. Fresh apples with skin, apricots, mangoes, pears, raspberries, and strawberries.  Meat and Other Protein Products Chunky peanut butter. Nuts and seeds. Beans and lentils. Tomasa BlaseBacon.  Dairy  High-fat cheeses. Milk, chocolate milk, and beverages made with milk, such as milk shakes. Cream. Ice cream. Sweets and Desserts Sweet rolls, doughnuts, and sweet breads. Pancakes and waffles. Fats and Oils Butter. Cream sauces. Margarine. Salad oils. Plain salad dressings. Olives. Avocados.  Beverages Caffeinated  beverages (such as coffee, tea, soda, or energy drinks). Alcoholic beverages. Fruit juices with pulp. Prune juice. Soft drinks sweetened with high-fructose corn syrup or sugar alcohols. Other Coconut. Hot sauce. Chili powder. Mayonnaise. Gravy. Cream-based or milk-based soups.  The items listed above may not be a complete list of foods and beverages to avoid. Contact your dietitian for more information. WHAT SHOULD I DO IF I BECOME DEHYDRATED? Diarrhea can sometimes lead to dehydration. Signs of dehydration include dark urine and dry mouth and skin. If you think you are dehydrated, you should rehydrate with an oral rehydration solution. These solutions can be purchased at pharmacies, retail stores, or online.  Drink -1 cup (120-240 mL) of oral rehydration solution each time you have an episode of diarrhea. If drinking this amount makes your diarrhea worse, try drinking smaller amounts more often. For example, drink 1-3 tsp (5-15 mL) every 5-10 minutes.  A general rule for staying hydrated is to drink 1-2 L of fluid per day. Talk to your health care provider about the specific amount you should be drinking each day. Drink enough fluids to keep your urine clear or pale yellow.   This information is not intended to replace advice given to you by your health care provider. Make sure you discuss any questions you have with your health care provider.   Document Released: 09/17/2003 Document Revised: 07/18/2014 Document Reviewed: 05/20/2013 Elsevier Interactive Patient Education [employer]

## 2015-11-08 NOTE — ED Notes (Signed)
Pt c/o abd cramping with 5-6 episodes of diarrhea, was recently placed on amoxicillin

## 2015-11-08 NOTE — ED Notes (Signed)
Pt here several days ago, Dxd with sinusitis and given antibiotics. Tonight he has had 3-4 loose stools and is here for evaluation

## 2015-11-11 ENCOUNTER — Emergency Department (HOSPITAL_COMMUNITY)
Admission: EM | Admit: 2015-11-11 | Discharge: 2015-11-11 | Disposition: A | Discharge status: Home / Self Care | Payer: Medicare Other | Attending: Emergency Medicine | Admitting: Emergency Medicine

## 2015-11-11 ENCOUNTER — Encounter (HOSPITAL_COMMUNITY): Payer: Self-pay | Admitting: Emergency Medicine

## 2015-11-11 DIAGNOSIS — Z79899 Other long term (current) drug therapy: Secondary | ICD-10-CM | POA: Insufficient documentation

## 2015-11-11 DIAGNOSIS — R05 Cough: Secondary | ICD-10-CM | POA: Diagnosis not present

## 2015-11-11 DIAGNOSIS — J0100 Acute maxillary sinusitis, unspecified: Secondary | ICD-10-CM

## 2015-11-11 DIAGNOSIS — Z7982 Long term (current) use of aspirin: Secondary | ICD-10-CM | POA: Diagnosis not present

## 2015-11-11 DIAGNOSIS — I10 Essential (primary) hypertension: Secondary | ICD-10-CM | POA: Insufficient documentation

## 2015-11-11 DIAGNOSIS — R110 Nausea: Secondary | ICD-10-CM | POA: Diagnosis present

## 2015-11-11 DIAGNOSIS — I2510 Atherosclerotic heart disease of native coronary artery without angina pectoris: Secondary | ICD-10-CM | POA: Insufficient documentation

## 2015-11-11 DIAGNOSIS — E785 Hyperlipidemia, unspecified: Secondary | ICD-10-CM | POA: Insufficient documentation

## 2015-11-11 MED ORDER — ONDANSETRON 4 MG PO TBDP: ORAL_TABLET | ORAL | Status: DC

## 2015-11-11 MED ORDER — ONDANSETRON 4 MG PO TBDP
4.0000 mg | ORAL_TABLET | Freq: Once | ORAL | Status: AC
  Administered 2015-11-11: 4 mg via ORAL
  Filled 2015-11-11: qty 1

## 2015-11-11 NOTE — ED Provider Notes (Signed)
CSN: [id number]     Arrival date & time 11/11/15  1805 History   First MD Initiated Contact with Patient 11/11/15 1836     Chief Complaint  Patient presents with  . Nausea     (Consider location/radiation/quality/duration/timing/severity/associated sxs/prior Treatment) Patient is a 82 y.o. male presenting with cough. The history is provided by the patient (Patient complains of mild cough and congestion. He started yesterday some medicine for sinus infection. An antibiotic.).  Cough Cough characteristics:  Non-productive Severity:  Mild Onset quality:  Gradual Timing:  Intermittent Progression:  Improving Chronicity:  New Smoker: no   Context: not fumes   Associated symptoms: no chest pain, no eye discharge, no headaches and no rash     Past Medical History  Diagnosis Date  . Hypertension   . Coronary artery disease   . Hyperlipidemia    Past Surgical History  Procedure Laterality Date  . Cardiac catheterization    . Coronary angioplasty    . Tonsillectomy     History reviewed. No pertinent family history. Social History  Substance Use Topics  . Smoking status: Never Smoker   . Smokeless tobacco: Never Used  . Alcohol Use: No    Review of Systems  Constitutional: Negative for appetite change and fatigue.  HENT: Negative for congestion, ear discharge and sinus pressure.   Eyes: Negative for discharge.  Respiratory: Positive for cough.   Cardiovascular: Negative for chest pain.  Gastrointestinal: Positive for nausea. Negative for abdominal pain and diarrhea.  Genitourinary: Negative for frequency and hematuria.  Musculoskeletal: Negative for back pain.  Skin: Negative for rash.  Neurological: Negative for seizures and headaches.  Psychiatric/Behavioral: Negative for hallucinations.      Allergies  Review of patient's allergies indicates no known allergies.  Home Medications   Prior to Admission medications   Medication Sig Start Date End Date Taking?  Authorizing Provider  amLODipine (NORVASC) 10 MG tablet Take 5 mg by mouth daily.     Historical Provider, MD  amoxicillin (AMOXIL) 500 MG capsule Take 1 capsule (500 mg total) by mouth 2 (two) times daily. 11/05/15   Rolland PorterMark James, MD  amoxicillin-clavulanate (AUGMENTIN) 875-125 MG tablet Take 1 tablet by mouth every 12 (twelve) hours. Patient not taking: Reported on 11/05/2015 10/17/15   Layla MawKristen N Ward, DO  aspirin EC 81 MG tablet Take 81 mg by mouth at bedtime.     Historical Provider, MD  chlordiazePOXIDE (LIBRIUM) 10 MG capsule Take 10 mg by mouth at bedtime. Takes daily.  Can increase to twice daily if needed.    Historical Provider, MD  fluticasone (FLOVENT HFA) 44 MCG/ACT inhaler Inhale into the lungs 2 (two) times daily.    Historical Provider, MD  hydrochlorothiazide (HYDRODIURIL) 50 MG tablet Take 50 mg by mouth.      Historical Provider, MD  lisinopril (PRINIVIL,ZESTRIL) 20 MG tablet Take 10 mg by mouth daily.     Historical Provider, MD  metoprolol (TOPROL-XL) 50 MG 24 hr tablet Take 50 mg by mouth 2 (two) times daily.      Historical Provider, MD  niacin 500 MG tablet Take 500 mg by mouth at bedtime.     Historical Provider, MD  nitroGLYCERIN (NITROSTAT) 0.4 MG SL tablet Place 0.4 mg under the tongue every 5 (five) minutes as needed. For chest pain     Historical Provider, MD  ondansetron (ZOFRAN ODT) 4 MG disintegrating tablet 4mg  ODT q4 hours prn nausea/vomit 11/11/15   Bethann BerkshireJoseph Jefferey Lippmann, MD  ondansetron (ZOFRAN) 8 MG  tablet Take 4-8 mg by mouth every 8 (eight) hours as needed for nausea or vomiting.    Historical Provider, MD  polyethylene glycol (MIRALAX / GLYCOLAX) packet Take 17 g by mouth daily.      Historical Provider, MD  potassium chloride (KLOR-CON) 8 MEQ CR tablet Take 8 mEq by mouth daily.      Historical Provider, MD  predniSONE (DELTASONE) 5 MG tablet Take 5-30 mg by mouth See admin instructions. 6,5,4,3,2,1 as directed starting on 10/30/15    Historical Provider, MD  rosuvastatin  (CRESTOR) 40 MG tablet Take 20 mg by mouth daily.      Historical Provider, MD  travoprost, benzalkonium, (TRAVATAN) 0.004 % ophthalmic solution Place 1 drop into both eyes at bedtime.      Historical Provider, MD   BP 136/105 mmHg  Pulse 94  Temp(Src) 98.6 F (37 C) (Oral)  Resp 18  Ht  (1.93 m)  Wt 196 lb (88.905 kg)  BMI 23.87 kg/m2  SpO2 99% Physical Exam  Constitutional: He is oriented to person, place, and time. He appears well-developed.  HENT:  Head: Normocephalic.  Eyes: Conjunctivae and EOM are normal. No scleral icterus.  Neck: Neck supple. No thyromegaly present.  Cardiovascular: Normal rate and regular rhythm.  Exam reveals no gallop and no friction rub.   No murmur heard. Pulmonary/Chest: No stridor. He has no wheezes. He has no rales. He exhibits no tenderness.  Abdominal: He exhibits no distension. There is no tenderness. There is no rebound.  Musculoskeletal: Normal range of motion. He exhibits no edema.  Lymphadenopathy:    He has no cervical adenopathy.  Neurological: He is oriented to person, place, and time. He exhibits normal muscle tone. Coordination normal.  Skin: No rash noted. No erythema.  Psychiatric: He has a normal mood and affect. His behavior is normal.    ED Course  Procedures (including critical care time) Labs Review Labs Reviewed - No data to display  Imaging Review No results found. I have personally reviewed and evaluated these images and lab results as part of my medical decision-making.   EKG Interpretation None      MDM   Final diagnoses:  Acute maxillary sinusitis, recurrence not specified    Patient on antibiotic for bronchitis and sinusitis but having some problems with nausea.. Patient will be put on Zofran and will follow-up with his PCP    Bethann Berkshire, MD 11/11/15 2100

## 2015-11-11 NOTE — Discharge Instructions (Signed)
Follow up with your md next week if not improving °

## 2015-11-11 NOTE — ED Notes (Signed)
Pt reports nausea,diarrhea, and sinus drainage for last several days. Pt reports "sinus drainage is going to my stomach and making me sick to my stomach." pt reports is currently taking imodium for diarrhea. nad noted.

## 2015-12-23 ENCOUNTER — Encounter (HOSPITAL_COMMUNITY): Payer: Self-pay

## 2015-12-23 ENCOUNTER — Emergency Department (HOSPITAL_COMMUNITY): Payer: Medicare Other

## 2015-12-23 ENCOUNTER — Emergency Department (HOSPITAL_COMMUNITY)
Admission: EM | Admit: 2015-12-23 | Discharge: 2015-12-23 | Disposition: A | Discharge status: Home / Self Care | Payer: Medicare Other | Attending: Emergency Medicine | Admitting: Emergency Medicine

## 2015-12-23 DIAGNOSIS — E785 Hyperlipidemia, unspecified: Secondary | ICD-10-CM | POA: Insufficient documentation

## 2015-12-23 DIAGNOSIS — Z7982 Long term (current) use of aspirin: Secondary | ICD-10-CM | POA: Diagnosis not present

## 2015-12-23 DIAGNOSIS — I2510 Atherosclerotic heart disease of native coronary artery without angina pectoris: Secondary | ICD-10-CM | POA: Insufficient documentation

## 2015-12-23 DIAGNOSIS — K5900 Constipation, unspecified: Secondary | ICD-10-CM | POA: Diagnosis present

## 2015-12-23 DIAGNOSIS — K5904 Chronic idiopathic constipation: Secondary | ICD-10-CM | POA: Diagnosis not present

## 2015-12-23 DIAGNOSIS — I10 Essential (primary) hypertension: Secondary | ICD-10-CM | POA: Diagnosis not present

## 2015-12-23 MED ORDER — MAGNESIUM CITRATE PO SOLN
1.0000 | Freq: Once | ORAL | Status: AC
  Administered 2015-12-23: 1 via ORAL
  Filled 2015-12-23: qty 296

## 2015-12-23 NOTE — Discharge Instructions (Signed)
Please increase the fiber in your diet. Regarding the polyethylene glycol you're taking, you may increase that to 2, or even 3 times a day as needed to keep your bowels regular.  Constipation, Adult Constipation is when a person has fewer than three bowel movements a week, has difficulty having a bowel movement, or has stools that are dry, hard, or larger than normal. As people grow older, constipation is more common. A low-fiber diet, not taking in enough fluids, and taking certain medicines may make constipation worse.  CAUSES   Certain medicines, such as antidepressants, pain medicine, iron supplements, antacids, and water pills.   Certain diseases, such as diabetes, irritable bowel syndrome (IBS), thyroid disease, or depression.   Not drinking enough water.   Not eating enough fiber-rich foods.   Stress or travel.   Lack of physical activity or exercise.   Ignoring the urge to have a bowel movement.   Using laxatives too much.  SIGNS AND SYMPTOMS   Having fewer than three bowel movements a week.   Straining to have a bowel movement.   Having stools that are hard, dry, or larger than normal.   Feeling full or bloated.   Pain in the lower abdomen.   Not feeling relief after having a bowel movement.  DIAGNOSIS  Your health care provider will take a medical history and perform a physical exam. Further testing may be done for severe constipation. Some tests may include:  A barium enema X-ray to examine your rectum, colon, and, sometimes, your small intestine.   A sigmoidoscopy to examine your lower colon.   A colonoscopy to examine your entire colon. TREATMENT  Treatment will depend on the severity of your constipation and what is causing it. Some dietary treatments include drinking more fluids and eating more fiber-rich foods. Lifestyle treatments may include regular exercise. If these diet and lifestyle recommendations do not help, your health care provider  may recommend taking over-the-counter laxative medicines to help you have bowel movements. Prescription medicines may be prescribed if over-the-counter medicines do not work.  HOME CARE INSTRUCTIONS   Eat foods that have a lot of fiber, such as fruits, vegetables, whole grains, and beans.  Limit foods high in fat and processed sugars, such as french fries, hamburgers, cookies, candies, and soda.   A fiber supplement may be added to your diet if you cannot get enough fiber from foods.   Drink enough fluids to keep your urine clear or pale yellow.   Exercise regularly or as directed by your health care provider.   Go to the restroom when you have the urge to go. Do not hold it.   Only take over-the-counter or prescription medicines as directed by your health care provider. Do not take other medicines for constipation without talking to your health care provider first.  SEEK IMMEDIATE MEDICAL CARE IF:   You have bright red blood in your stool.   Your constipation lasts for more than 4 days or gets worse.   You have abdominal or rectal pain.   You have thin, pencil-like stools.   You have unexplained weight loss. MAKE SURE YOU:   Understand these instructions.  Will watch your condition.  Will get help right away if you are not doing well or get worse.   This information is not intended to replace advice given to you by your health care provider. Make sure you discuss any questions you have with your health care provider.   Document Released: 03/25/2004 Document  Revised: 07/18/2014 Document Reviewed: 04/08/2013 Elsevier Interactive Patient Education [employer].

## 2015-12-23 NOTE — ED Provider Notes (Signed)
CSN: [id number]     Arrival date & time 12/23/15  0530 History   First MD Initiated Contact with Patient 12/23/15 0535     Chief Complaint  Patient presents with  . Constipation     (Consider location/radiation/quality/duration/timing/severity/associated sxs/prior Treatment) Patient is a 82 y.o. male presenting with constipation. The history is provided by the patient.  Constipation He has a history of hypertension, coronary disease, hyperlipidemia. He complains of not having a normal bowel movement for the last 2-3 days. He has been having a bowel movement each day, but it is small and hard and he does not feel as if he has completely evacuated the bowels. He denies abdominal pain, nausea, vomiting. He does take polyethylene glycol once a day. He was not sure if he should take a laxative or not. He did have a colonoscopy recently which was unremarkable.  Past Medical History  Diagnosis Date  . Hypertension   . Coronary artery disease   . Hyperlipidemia    Past Surgical History  Procedure Laterality Date  . Cardiac catheterization    . Coronary angioplasty    . Tonsillectomy     No family history on file. Social History  Substance Use Topics  . Smoking status: Never Smoker   . Smokeless tobacco: Never Used  . Alcohol Use: No    Review of Systems  Gastrointestinal: Positive for constipation.  All other systems reviewed and are negative.     Allergies  Review of patient's allergies indicates no known allergies.  Home Medications   Prior to Admission medications   Medication Sig Start Date End Date Taking? Authorizing Provider  amLODipine (NORVASC) 10 MG tablet Take 5 mg by mouth daily.     Historical Provider, MD  amoxicillin (AMOXIL) 500 MG capsule Take 1 capsule (500 mg total) by mouth 2 (two) times daily. 11/05/15   Rolland PorterMark James, MD  amoxicillin-clavulanate (AUGMENTIN) 875-125 MG tablet Take 1 tablet by mouth every 12 (twelve) hours. Patient not taking: Reported on  11/05/2015 10/17/15   Layla MawKristen N Ward, DO  aspirin EC 81 MG tablet Take 81 mg by mouth at bedtime.     Historical Provider, MD  chlordiazePOXIDE (LIBRIUM) 10 MG capsule Take 10 mg by mouth at bedtime. Takes daily.  Can increase to twice daily if needed.    Historical Provider, MD  fluticasone (FLOVENT HFA) 44 MCG/ACT inhaler Inhale into the lungs 2 (two) times daily.    Historical Provider, MD  hydrochlorothiazide (HYDRODIURIL) 50 MG tablet Take 50 mg by mouth.      Historical Provider, MD  lisinopril (PRINIVIL,ZESTRIL) 20 MG tablet Take 10 mg by mouth daily.     Historical Provider, MD  metoprolol (TOPROL-XL) 50 MG 24 hr tablet Take 50 mg by mouth 2 (two) times daily.      Historical Provider, MD  niacin 500 MG tablet Take 500 mg by mouth at bedtime.     Historical Provider, MD  nitroGLYCERIN (NITROSTAT) 0.4 MG SL tablet Place 0.4 mg under the tongue every 5 (five) minutes as needed. For chest pain     Historical Provider, MD  ondansetron (ZOFRAN ODT) 4 MG disintegrating tablet 4mg  ODT q4 hours prn nausea/vomit 11/11/15   Bethann BerkshireJoseph Zammit, MD  ondansetron (ZOFRAN) 8 MG tablet Take 4-8 mg by mouth every 8 (eight) hours as needed for nausea or vomiting.    Historical Provider, MD  polyethylene glycol (MIRALAX / GLYCOLAX) packet Take 17 g by mouth daily.      Historical Provider,  MD  potassium chloride (KLOR-CON) 8 MEQ CR tablet Take 8 mEq by mouth daily.      Historical Provider, MD  predniSONE (DELTASONE) 5 MG tablet Take 5-30 mg by mouth See admin instructions. 6,5,4,3,2,1 as directed starting on 10/30/15    Historical Provider, MD  rosuvastatin (CRESTOR) 40 MG tablet Take 20 mg by mouth daily.      Historical Provider, MD  travoprost, benzalkonium, (TRAVATAN) 0.004 % ophthalmic solution Place 1 drop into both eyes at bedtime.      Historical Provider, MD   BP 125/86 mmHg  Pulse 72  Temp(Src) 97.7 F (36.5 C) (Oral)  Resp 16  Ht  (1.93 m)  Wt 196 lb (88.905 kg)  BMI 23.87 kg/m2  SpO2  99% Physical Exam  Nursing note and vitals reviewed.  82 year old male, resting comfortably and in no acute distress. Vital signs are normal. Oxygen saturation is 99%, which is normal. Head is normocephalic and atraumatic. PERRLA, EOMI. Oropharynx is clear. Neck is nontender and supple without adenopathy or JVD. Back is nontender and there is no CVA tenderness. Lungs are clear without rales, wheezes, or rhonchi. Chest is nontender. Heart has regular rate and rhythm without murmur. Abdomen is soft, flat, nontender without masses or hepatosplenomegaly and peristalsis is normoactive. Rectal: Normal sphincter tone. No impaction. Small amount of formed stool. Extremities have no cyanosis or edema, full range of motion is present. Skin is warm and dry without rash. Neurologic: Mental status is normal, cranial nerves are intact, there are no motor or sensory deficits.  ED Course  Procedures (including critical care time)  Imaging Review Dg Abd 1 View  12/23/2015  CLINICAL DATA:  Constipation for 3 days. Abdominal fullness. Colonoscopy 1 month ago with normal results. EXAM: ABDOMEN - 1 VIEW COMPARISON:  08/03/2010 FINDINGS: Gas and stool throughout the colon. No small or large bowel distention. No radiopaque stones. Surgical clips in the right abdomen. Vascular calcifications. Probable opaque medication projected over the right pelvis. Degenerative changes in the spine. IMPRESSION: Nonobstructive bowel gas pattern with stool-filled colon. Electronically Signed   By: Burman Nieves M.D.   On: 12/23/2015 06:14   I have personally reviewed and evaluated these images as part of my medical decision-making.   MDM   Final diagnoses:  Functional constipation    Probable functional constipation. He is not on any medications that are likely to cause constipation. Old records are reviewed and he has had several ED visits for GI related issues. He'll be sent for x-ray to evaluate stool  burden.  X-ray does show a large amount of stool present throughout the colon. He is given a dose of magnesium citrate and given instructions for functional constipation. He is encouraged to increase the amount of fiber in his diet, and advised that he may increase his dose of polyethylene glycol to 2, or even 3 packets a day as needed.  Dione Booze, MD 12/23/15 [phone]

## 2015-12-23 NOTE — ED Notes (Signed)
For the past 2-3 days my bowels have not been moving like they should.  Had a colonoscopy 1 month ago at the [facility] and everything was fine.

## 2016-06-17 ENCOUNTER — Emergency Department (HOSPITAL_COMMUNITY)
Admission: EM | Admit: 2016-06-17 | Discharge: 2016-06-17 | Disposition: A | Discharge status: Home / Self Care | Payer: Medicare Other | Attending: Emergency Medicine | Admitting: Emergency Medicine

## 2016-06-17 ENCOUNTER — Encounter (HOSPITAL_COMMUNITY): Payer: Self-pay

## 2016-06-17 DIAGNOSIS — J029 Acute pharyngitis, unspecified: Secondary | ICD-10-CM | POA: Diagnosis present

## 2016-06-17 DIAGNOSIS — I2510 Atherosclerotic heart disease of native coronary artery without angina pectoris: Secondary | ICD-10-CM | POA: Diagnosis not present

## 2016-06-17 DIAGNOSIS — Z7982 Long term (current) use of aspirin: Secondary | ICD-10-CM | POA: Insufficient documentation

## 2016-06-17 DIAGNOSIS — J329 Chronic sinusitis, unspecified: Secondary | ICD-10-CM | POA: Insufficient documentation

## 2016-06-17 DIAGNOSIS — I10 Essential (primary) hypertension: Secondary | ICD-10-CM | POA: Insufficient documentation

## 2016-06-17 DIAGNOSIS — Z79899 Other long term (current) drug therapy: Secondary | ICD-10-CM | POA: Diagnosis not present

## 2016-06-17 MED ORDER — AMOXICILLIN 500 MG PO CAPS: 500.0000 mg | ORAL_CAPSULE | Freq: Two times a day (BID) | ORAL | 0 refills | Status: DC

## 2016-06-17 NOTE — ED Triage Notes (Signed)
I have a sore throat and I think it is coming from my sinuses.  I was seen at the [facility] in [place] for the [place] thing on Wednesday and they told me to take over the counter medications and I already had them, but it did not change anything.

## 2016-06-17 NOTE — ED Provider Notes (Signed)
[facility]-EMERGENCY DEPT Provider Note   CSN: [id number] Arrival date & time: 06/17/16  0445     History   Chief Complaint Chief Complaint  Patient presents with  . Sore Throat    HPI Samuel Wilkerson is a 82 y.o. male.  The history is provided by the patient.  Sore Throat  This is a new problem. The current episode started more than 2 days ago. The problem occurs daily. The problem has been gradually worsening. Pertinent negatives include no chest pain and no shortness of breath. The symptoms are aggravated by swallowing. Nothing relieves the symptoms.    Past Medical History:  Diagnosis Date  . Coronary artery disease   . Hyperlipidemia   . Hypertension     There are no active problems to display for this patient.   Past Surgical History:  Procedure Laterality Date  . CARDIAC CATHETERIZATION    . CORONARY ANGIOPLASTY    . TONSILLECTOMY         Home Medications    Prior to Admission medications   Medication Sig Start Date End Date Taking? Authorizing Provider  amLODipine (NORVASC) 10 MG tablet Take 5 mg by mouth daily.    Yes Historical Provider, MD  aspirin EC 81 MG tablet Take 81 mg by mouth at bedtime.    Yes Historical Provider, MD  chlordiazePOXIDE (LIBRIUM) 10 MG capsule Take 10 mg by mouth at bedtime. Takes daily.  Can increase to twice daily if needed.   Yes Historical Provider, MD  dorzolamide (TRUSOPT) 2 % ophthalmic solution Place 1 drop into both eyes 2 (two) times daily.   Yes Historical Provider, MD  guaiFENesin 200 MG tablet Take 400 mg by mouth 2 (two) times daily.   Yes Historical Provider, MD  hydrocortisone-pramoxine [facility](PROCTOFOAM-HC) rectal foam Place 1 applicator rectally as needed for hemorrhoids or itching.   Yes Historical Provider, MD  latanoprost (XALATAN) 0.005 % ophthalmic solution Place 1 drop into both eyes at bedtime.   Yes Historical Provider, MD  lisinopril (PRINIVIL,ZESTRIL) 20 MG tablet Take 10 mg by mouth daily.    Yes Historical  Provider, MD  loratadine (CLARITIN) 10 MG tablet Take 10 mg by mouth daily.   Yes Historical Provider, MD  metoprolol (TOPROL-XL) 50 MG 24 hr tablet Take 50 mg by mouth 2 (two) times daily.     Yes Historical Provider, MD  naproxen (NAPROSYN) 375 MG tablet Take 375 mg by mouth 2 (two) times daily with a meal.   Yes Historical Provider, MD  nitroGLYCERIN (NITROSTAT) 0.4 MG SL tablet Place 0.4 mg under the tongue every 5 (five) minutes as needed. For chest pain    Yes Historical Provider, MD  omeprazole (PRILOSEC) 20 MG capsule Take 20 mg by mouth 2 (two) times daily before a meal.   Yes Historical Provider, MD  ondansetron (ZOFRAN) 8 MG tablet Take 4-8 mg by mouth every 8 (eight) hours as needed for nausea or vomiting.   Yes Historical Provider, MD  polyethylene glycol (MIRALAX / GLYCOLAX) packet Take 17 g by mouth daily.     Yes Historical Provider, MD  potassium chloride (KLOR-CON) 8 MEQ CR tablet Take 10 mEq by mouth daily.    Yes Historical Provider, MD  rosuvastatin (CRESTOR) 40 MG tablet Take 20 mg by mouth daily.     Yes Historical Provider, MD  travoprost, benzalkonium, (TRAVATAN) 0.004 % ophthalmic solution Place 1 drop into both eyes at bedtime.     Yes Historical Provider, MD  amoxicillin (AMOXIL)  500 MG capsule Take 1 capsule (500 mg total) by mouth 2 (two) times daily. 06/17/16   Zadie Rhineonald Izela Altier, MD  fluticasone (FLOVENT HFA) 44 MCG/ACT inhaler Inhale into the lungs 2 (two) times daily.    Historical Provider, MD  hydrochlorothiazide (HYDRODIURIL) 50 MG tablet Take 50 mg by mouth.      Historical Provider, MD  niacin 500 MG tablet Take 500 mg by mouth at bedtime.     Historical Provider, MD  ondansetron (ZOFRAN ODT) 4 MG disintegrating tablet 4mg  ODT q4 hours prn nausea/vomit 11/11/15   Bethann BerkshireJoseph Zammit, MD    Family History No family history on file.  Social History Social History  Substance Use Topics  . Smoking status: Never Smoker  . Smokeless tobacco: Never Used  . Alcohol use No      Allergies   Patient has no known allergies.   Review of Systems Review of Systems  Constitutional: Negative for fever.  HENT: Positive for postnasal drip and sore throat.   Respiratory: Negative for shortness of breath.   Cardiovascular: Negative for chest pain.     Physical Exam Updated Vital Signs BP 132/76 (BP Location: Left Arm)   Pulse 72   Temp 97.9 F (36.6 C) (Oral)   Resp 20   Ht 6\' 4"  (1.93 m)   Wt 93 kg   SpO2 100%   BMI 24.95 kg/m   Physical Exam CONSTITUTIONAL: Well developed/well nourished HEAD: Normocephalic/atraumatic EYES: EOMI ENMT: Mucous membranes moist, uvula midline, no stridor, no drooling, normal phonation, mild nasal congestion noted.  No erythema/exudates noted.   NECK: supple no meningeal signs CV: S1/S2 noted, no murmurs/rubs/gallops noted LUNGS: Lungs are clear to auscultation bilaterally, no apparent distress NEURO: Pt is awake/alert/appropriate, moves all extremitiesx4.   SKIN: warm, color normal PSYCH: no abnormalities of mood noted, alert and oriented to situation   ED Treatments / Results  Labs (all labs ordered are listed, but only abnormal results are displayed) Labs Reviewed - No data to display  EKG  EKG Interpretation None       Radiology No results found.  Procedures Procedures (including critical care time)  Medications Ordered in ED Medications - No data to display   Initial Impression / Assessment and Plan / ED Course  I have reviewed the triage vital signs and the nursing notes.    Clinical Course     Pt here with what he feels is recurrent sinusitis He insists that the only that helps is antibiotics Amoxicillin ordered  Final Clinical Impressions(s) / ED Diagnoses   Final diagnoses:  Sore throat  Sinusitis, unspecified chronicity, unspecified location    New Prescriptions New Prescriptions   AMOXICILLIN (AMOXIL) 500 MG CAPSULE    Take 1 capsule (500 mg total) by mouth 2 (two) times  daily.     Zadie Rhineonald Marli Diego, MD 06/17/16 [phone]

## 2016-07-10 ENCOUNTER — Encounter (HOSPITAL_COMMUNITY): Payer: Self-pay | Admitting: *Deleted

## 2016-07-10 ENCOUNTER — Emergency Department (HOSPITAL_COMMUNITY)
Admission: EM | Admit: 2016-07-10 | Discharge: 2016-07-10 | Disposition: A | Discharge status: Home / Self Care | Payer: Medicare Other | Attending: Emergency Medicine | Admitting: Emergency Medicine

## 2016-07-10 ENCOUNTER — Emergency Department (HOSPITAL_COMMUNITY): Payer: Medicare Other

## 2016-07-10 DIAGNOSIS — I10 Essential (primary) hypertension: Secondary | ICD-10-CM | POA: Insufficient documentation

## 2016-07-10 DIAGNOSIS — J40 Bronchitis, not specified as acute or chronic: Secondary | ICD-10-CM | POA: Diagnosis not present

## 2016-07-10 DIAGNOSIS — I2510 Atherosclerotic heart disease of native coronary artery without angina pectoris: Secondary | ICD-10-CM | POA: Diagnosis not present

## 2016-07-10 DIAGNOSIS — R0602 Shortness of breath: Secondary | ICD-10-CM | POA: Diagnosis present

## 2016-07-10 DIAGNOSIS — J329 Chronic sinusitis, unspecified: Secondary | ICD-10-CM | POA: Diagnosis not present

## 2016-07-10 DIAGNOSIS — Z79899 Other long term (current) drug therapy: Secondary | ICD-10-CM | POA: Diagnosis not present

## 2016-07-10 DIAGNOSIS — Z7982 Long term (current) use of aspirin: Secondary | ICD-10-CM | POA: Diagnosis not present

## 2016-07-10 MED ORDER — DEXAMETHASONE SODIUM PHOSPHATE 10 MG/ML IJ SOLN
10.0000 mg | Freq: Once | INTRAMUSCULAR | Status: AC
  Administered 2016-07-10: 10 mg via INTRAMUSCULAR
  Filled 2016-07-10: qty 1

## 2016-07-10 MED ORDER — GUAIFENESIN 100 MG/5ML PO SYRP: 100.0000 mg | ORAL_SOLUTION | ORAL | 0 refills | Status: DC | PRN

## 2016-07-10 MED ORDER — ALBUTEROL SULFATE HFA 108 (90 BASE) MCG/ACT IN AERS: 1.0000 | INHALATION_SPRAY | Freq: Four times a day (QID) | RESPIRATORY_TRACT | 0 refills | Status: AC | PRN

## 2016-07-10 MED ORDER — FLUTICASONE PROPIONATE 50 MCG/ACT NA SUSP: 1.0000 | Freq: Every day | NASAL | 2 refills | Status: DC

## 2016-07-10 NOTE — Discharge Instructions (Signed)
Please continue your certrizine (Zyrtec) 10mg  once a day as prescribed. I recommend that you start taking Flonase 1 spray in each nostril once a day. You may continue guaifenesin (Robitussin liquid) as needed for cough. Your chest x-ray showed no pneumonia. You do not need antibiotics at this time.

## 2016-07-10 NOTE — ED Notes (Signed)
Pt states understanding of care given and follow up instructions 

## 2016-07-10 NOTE — ED Triage Notes (Signed)
Pt c/o sinus drainage, cough for the past week, was seen in er at [facility] for same symptoms, pt states that when he was lying down tonight he woke up sob, denies any pain,

## 2016-07-10 NOTE — ED Provider Notes (Signed)
TIME SEEN: 12:58 AM  CHIEF COMPLAINT: Sore Throat   HPI: HPI Comments: Samuel Wilkerson is a 82 y.o. male with PMHX coronary artery disease, hyperlipidemia, HTN, who presents to the Emergency Department complaining of gradual onset, constant sinus drainage and sinus pressure that he has had intermittently for years. He notes coughing up some "yellow" mucous that has been going on for a couple of years. Pt has hx of chronic sinusitis and reports antibiotics are the only things that help him. Patient was previously on amoxicillin the beginning of December and reports only minimal relief. Per pt, he notes being seen at the [place] a couple of days ago and was prescribed with Siltussin DM cough syrup and Guaifenesin (200mg ) tab with some relief. Denies HA, fever, chills, chest pain, SOB. Per pt, he also notes taking Zyrtec.  No longer on Claritin. He states he has been using a nasal spray intermittently over-the-counter but is not sure if it is Flonase. He does not think it is Afrin. No vomiting or diarrhea. No headache, neck pain or neck stiffness.   ROS: See HPI Constitutional: no fever  Eyes: no drainage  ENT: no runny nose   Cardiovascular:  no chest pain  Resp: no SOB  GI: no vomiting GU: no dysuria Integumentary: no rash  Allergy: no hives  Musculoskeletal: no leg swelling  Neurological: no slurred speech ROS otherwise negative  PAST MEDICAL HISTORY/PAST SURGICAL HISTORY:  Past Medical History:  Diagnosis Date  . Coronary artery disease   . Hyperlipidemia   . Hypertension     MEDICATIONS:  Prior to Admission medications   Medication Sig Start Date End Date Taking? Authorizing Provider  amLODipine (NORVASC) 10 MG tablet Take 5 mg by mouth daily.     Historical Provider, MD  amoxicillin (AMOXIL) 500 MG capsule Take 1 capsule (500 mg total) by mouth 2 (two) times daily. 06/17/16   Zadie Rhine, MD  aspirin EC 81 MG tablet Take 81 mg by mouth at bedtime.     Historical Provider, MD   chlordiazePOXIDE (LIBRIUM) 10 MG capsule Take 10 mg by mouth at bedtime. Takes daily.  Can increase to twice daily if needed.    Historical Provider, MD  dorzolamide (TRUSOPT) 2 % ophthalmic solution Place 1 drop into both eyes 2 (two) times daily.    Historical Provider, MD  fluticasone (FLOVENT HFA) 44 MCG/ACT inhaler Inhale into the lungs 2 (two) times daily.    Historical Provider, MD  guaiFENesin 200 MG tablet Take 400 mg by mouth 2 (two) times daily.    Historical Provider, MD  hydrochlorothiazide (HYDRODIURIL) 50 MG tablet Take 50 mg by mouth.      Historical Provider, MD  hydrocortisone-pramoxine [facility]) rectal foam Place 1 applicator rectally as needed for hemorrhoids or itching.    Historical Provider, MD  latanoprost (XALATAN) 0.005 % ophthalmic solution Place 1 drop into both eyes at bedtime.    Historical Provider, MD  lisinopril (PRINIVIL,ZESTRIL) 20 MG tablet Take 10 mg by mouth daily.     Historical Provider, MD  loratadine (CLARITIN) 10 MG tablet Take 10 mg by mouth daily.    Historical Provider, MD  metoprolol (TOPROL-XL) 50 MG 24 hr tablet Take 50 mg by mouth 2 (two) times daily.      Historical Provider, MD  naproxen (NAPROSYN) 375 MG tablet Take 375 mg by mouth 2 (two) times daily with a meal.    Historical Provider, MD  niacin 500 MG tablet Take 500 mg by mouth  at bedtime.     Historical Provider, MD  nitroGLYCERIN (NITROSTAT) 0.4 MG SL tablet Place 0.4 mg under the tongue every 5 (five) minutes as needed. For chest pain     Historical Provider, MD  omeprazole (PRILOSEC) 20 MG capsule Take 20 mg by mouth 2 (two) times daily before a meal.    Historical Provider, MD  ondansetron (ZOFRAN ODT) 4 MG disintegrating tablet 4mg  ODT q4 hours prn nausea/vomit 11/11/15   Bethann BerkshireJoseph Zammit, MD  ondansetron (ZOFRAN) 8 MG tablet Take 4-8 mg by mouth every 8 (eight) hours as needed for nausea or vomiting.    Historical Provider, MD  polyethylene glycol (MIRALAX / GLYCOLAX) packet Take 17  g by mouth daily.      Historical Provider, MD  potassium chloride (KLOR-CON) 8 MEQ CR tablet Take 10 mEq by mouth daily.     Historical Provider, MD  rosuvastatin (CRESTOR) 40 MG tablet Take 20 mg by mouth daily.      Historical Provider, MD  travoprost, benzalkonium, (TRAVATAN) 0.004 % ophthalmic solution Place 1 drop into both eyes at bedtime.      Historical Provider, MD    ALLERGIES:  No Known Allergies  SOCIAL HISTORY:  Social History  Substance Use Topics  . Smoking status: Never Smoker  . Smokeless tobacco: Never Used  . Alcohol use No    FAMILY HISTORY: No family history on file.  EXAM: BP 123/74   Pulse 69   Temp 97.5 F (36.4 C) (Oral)   Resp 20   Ht 6\' 4"  (1.93 m)   Wt 200 lb (90.7 kg)   SpO2 100%   BMI 24.34 kg/m  CONSTITUTIONAL: Alert and oriented and responds appropriately to questions. Well-appearing; well-nourished, Elderly, poor historian, afebrile and nontoxic, in no distress HEAD: Normocephalic EYES: Conjunctivae clear, PERRL, EOMI ENT: normal nose; no rhinorrhea; moist mucous membranes; No pharyngeal erythema or petechiae, no tonsillar hypertrophy or exudate, no uvular deviation, no unilateral swelling, no trismus or drooling, no muffled voice, normal phonation, no stridor, no dental caries present, no drainable dental abscess noted, no Ludwig's angina, tongue sits flat in the bottom of the mouth, no angioedema, no facial erythema or warmth, no facial swelling; no pain with movement of the neck; no tenderness over the sinuses, no facial swelling, nasal turbinates are inflamed bilaterally and pale with some clear sinus drainage noted in the nostrils and in the posterior oropharynx NECK: Supple, no meningismus, no nuchal rigidity, no LAD  CARD: RRR; S1 and S2 appreciated; no murmurs, no clicks, no rubs, no gallops RESP: Normal chest excursion without splinting or tachypnea; breath sounds clear and equal bilaterally; no wheezes, no rhonchi, no rales, no hypoxia  or respiratory distress, speaking full sentences ABD/GI: Normal bowel sounds; non-distended; soft, non-tender, no rebound, no guarding, no peritoneal signs, no hepatosplenomegaly BACK:  The back appears normal and is non-tender to palpation, there is no CVA tenderness EXT: Normal ROM in all joints; non-tender to palpation; no edema; normal capillary refill; no cyanosis, no calf tenderness or swelling    SKIN: Normal color for age and race; warm; no rash NEURO: Moves all extremities equally, sensation to light touch intact diffusely, cranial nerves II through XII intact, normal speech PSYCH: The patient's mood and manner are appropriate. Grooming and personal hygiene are appropriate.  MEDICAL DECISION MAKING: Patient here with what appears to be chronic sinusitis. Chest x-ray shows no infiltrate, edema. He does have signs of chronic bronchitis. Lungs are clear to auscultation with no wheezing  and he is not hypoxic. Denies shortness of breath at this time. No chest pain. He was recently on a round of amoxicillin and denies it was helpful. He has not had any fevers, chills, recent yellow/green discharge from his nose or productive cough. Discussed with him that I do not feel any device would be helpful and he agrees. I recommended he continue his Zyrtec and start using Flonase daily if he is not doing so already. Will refill his liquid guaifenesin as he states this was helpful for him. I recommended close follow-up with his PCP Dr. Sudie BaileyKnowlton. I do not feel he needs further emergent workup at this time. Also discharge without purulent inhaler given signs of chronic bronchitis on chest x-ray. I do not feel he needs steroids. He is comfortable with this plan.  At this time, I do not feel there is any life-threatening condition present. I have reviewed and discussed all results (EKG, imaging, lab, urine as appropriate) and exam findings with patient/family. I have reviewed nursing notes and appropriate previous  records.  I feel the patient is safe to be discharged home without further emergent workup and can continue workup as an outpatient as needed. Discussed usual and customary return precautions. Patient/family verbalize understanding and are comfortable with this plan.  Outpatient follow-up has been provided. All questions have been answered.   I personally performed the services described in this documentation, which was scribed in my presence. The recorded information has been reviewed and is accurate.     Layla MawKristen N Ward, DO 07/10/16 [phone]

## 2016-09-03 ENCOUNTER — Encounter (HOSPITAL_COMMUNITY): Payer: Self-pay | Admitting: *Deleted

## 2016-09-03 ENCOUNTER — Emergency Department (HOSPITAL_COMMUNITY)
Admission: EM | Admit: 2016-09-03 | Discharge: 2016-09-03 | Disposition: A | Discharge status: Home / Self Care | Payer: Medicare Other | Attending: Emergency Medicine | Admitting: Emergency Medicine

## 2016-09-03 DIAGNOSIS — Z79899 Other long term (current) drug therapy: Secondary | ICD-10-CM | POA: Diagnosis not present

## 2016-09-03 DIAGNOSIS — I10 Essential (primary) hypertension: Secondary | ICD-10-CM | POA: Diagnosis not present

## 2016-09-03 DIAGNOSIS — I2510 Atherosclerotic heart disease of native coronary artery without angina pectoris: Secondary | ICD-10-CM | POA: Diagnosis not present

## 2016-09-03 DIAGNOSIS — Z760 Encounter for issue of repeat prescription: Secondary | ICD-10-CM | POA: Diagnosis present

## 2016-09-03 MED ORDER — CHLORDIAZEPOXIDE HCL 10 MG PO CAPS: 10.0000 mg | ORAL_CAPSULE | Freq: Once | ORAL | Status: DC

## 2016-09-03 MED ORDER — CHLORDIAZEPOXIDE HCL 10 MG PO CAPS: 10.0000 mg | ORAL_CAPSULE | Freq: Every day | ORAL | 0 refills | Status: DC

## 2016-09-03 NOTE — Discharge Instructions (Signed)
Pick up your regular prescription Monday. Return to the ED if you develop new or worsening symptoms.

## 2016-09-03 NOTE — ED Triage Notes (Signed)
Pt reports being out of Chlordiazepoxide 10 mg capsule bid as needed for anxiety. Pt has been out of med x 2 days.

## 2016-09-03 NOTE — ED Provider Notes (Signed)
[facility]-EMERGENCY DEPT Provider Note   CSN: [id number] Arrival date & time: 09/03/16  2235   By signing my name below, I, Samuel Wilkerson, attest that this documentation has been prepared under the direction and in the presence of Glynn OctaveStephen Tondra Reierson, MD. Electronically signed, Samuel Wilkerson, ED Scribe. 09/03/16. 11:12 PM.   History   Chief Complaint Chief Complaint  Patient presents with  . Medication Refill   The history is provided by the patient and medical records. No language interpreter was used.    HPI Comments: Samuel Wilkerson is a 83 y.o. male who presents to the Emergency Department for medication refill. Pt states he has been out of prescribed chlordiazepoxide x 2 days. He reports associated shakiness. He saw Dr. Sudie BaileyKnowlton yesterday and prescription was called to pharmacy. [facility] further reports [facility] when he went to refill it 2 days ago, so he attempted to have the prescription transferred to [facility]. He further states he was told he needed approval from a doctor to get the medication filled at [facility] at that time. He reportedly does not smoke or drink. Pt denies SOB, chest pain, N/V/D, constipation and appetite change.  Past Medical History:  Diagnosis Date  . Coronary artery disease   . Hyperlipidemia   . Hypertension     There are no active problems to display for this patient.   Past Surgical History:  Procedure Laterality Date  . CARDIAC CATHETERIZATION    . CORONARY ANGIOPLASTY    . TONSILLECTOMY         Home Medications    Prior to Admission medications   Medication Sig Start Date End Date Taking? Authorizing Provider  albuterol (PROVENTIL HFA;VENTOLIN HFA) 108 (90 Base) MCG/ACT inhaler Inhale 1-2 puffs into the lungs every 6 (six) hours as needed for wheezing or shortness of breath. 07/10/16   Kristen N Ward, DO  amLODipine (NORVASC) 10 MG tablet Take 5 mg by mouth daily.     Historical Provider, MD  amoxicillin (AMOXIL) 500 MG  capsule Take 1 capsule (500 mg total) by mouth 2 (two) times daily. 06/17/16   Zadie Rhineonald Wickline, MD  aspirin EC 81 MG tablet Take 81 mg by mouth at bedtime.     Historical Provider, MD  chlordiazePOXIDE (LIBRIUM) 10 MG capsule Take 10 mg by mouth at bedtime. Takes daily.  Can increase to twice daily if needed.    Historical Provider, MD  dorzolamide (TRUSOPT) 2 % ophthalmic solution Place 1 drop into both eyes 2 (two) times daily.    Historical Provider, MD  fluticasone (FLONASE) 50 MCG/ACT nasal spray Place 1 spray into both nostrils daily. 07/10/16   Kristen N Ward, DO  guaifenesin (ROBITUSSIN) 100 MG/5ML syrup Take 5-10 mLs (100-200 mg total) by mouth every 4 (four) hours as needed for cough. 07/10/16   Kristen N Ward, DO  hydrochlorothiazide (HYDRODIURIL) 50 MG tablet Take 50 mg by mouth.      Historical Provider, MD  hydrocortisone-pramoxine [facility](PROCTOFOAM-HC) rectal foam Place 1 applicator rectally as needed for hemorrhoids or itching.    Historical Provider, MD  latanoprost (XALATAN) 0.005 % ophthalmic solution Place 1 drop into both eyes at bedtime.    Historical Provider, MD  lisinopril (PRINIVIL,ZESTRIL) 20 MG tablet Take 10 mg by mouth daily.     Historical Provider, MD  metoprolol (TOPROL-XL) 50 MG 24 hr tablet Take 50 mg by mouth 2 (two) times daily.      Historical Provider, MD  naproxen (NAPROSYN) 375 MG  tablet Take 375 mg by mouth 2 (two) times daily with a meal.    Historical Provider, MD  niacin 500 MG tablet Take 500 mg by mouth at bedtime.     Historical Provider, MD  nitroGLYCERIN (NITROSTAT) 0.4 MG SL tablet Place 0.4 mg under the tongue every 5 (five) minutes as needed. For chest pain     Historical Provider, MD  omeprazole (PRILOSEC) 20 MG capsule Take 20 mg by mouth 2 (two) times daily before a meal.    Historical Provider, MD  ondansetron (ZOFRAN ODT) 4 MG disintegrating tablet 4mg  ODT q4 hours prn nausea/vomit 11/11/15   Bethann Berkshire, MD  ondansetron (ZOFRAN) 8 MG tablet Take 4-8  mg by mouth every 8 (eight) hours as needed for nausea or vomiting.    Historical Provider, MD  polyethylene glycol (MIRALAX / GLYCOLAX) packet Take 17 g by mouth daily.      Historical Provider, MD  potassium chloride (KLOR-CON) 8 MEQ CR tablet Take 10 mEq by mouth daily.     Historical Provider, MD  rosuvastatin (CRESTOR) 40 MG tablet Take 20 mg by mouth daily.      Historical Provider, MD  travoprost, benzalkonium, (TRAVATAN) 0.004 % ophthalmic solution Place 1 drop into both eyes at bedtime.      Historical Provider, MD    Family History History reviewed. No pertinent family history.  Social History Social History  Substance Use Topics  . Smoking status: Never Smoker  . Smokeless tobacco: Never Used  . Alcohol use No     Allergies   Patient has no known allergies.   Review of Systems Review of Systems  All other systems reviewed and are negative.  A complete 10 system review of systems was obtained and all systems are negative except as noted in the HPI and PMH.    Physical Exam Updated Vital Signs BP 132/83 (BP Location: Left Arm)   Pulse 73   Temp 97.9 F (36.6 C) (Oral)   Resp 18   Ht 6\' 4"  (1.93 m)   Wt 205 lb (93 kg)   SpO2 100%   BMI 24.95 kg/m   Physical Exam  Constitutional: He is oriented to person, place, and time. He appears well-developed and well-nourished. No distress.  HENT:  Head: Normocephalic and atraumatic.  Mouth/Throat: Oropharynx is clear and moist. No oropharyngeal exudate.  Eyes: Conjunctivae and EOM are normal. Pupils are equal, round, and reactive to light.  Neck: Normal range of motion. Neck supple. No JVD present.  No meningismus.  Cardiovascular: Normal rate, regular rhythm, normal heart sounds and intact distal pulses.  Exam reveals no gallop and no friction rub.   No murmur heard. Pulmonary/Chest: Effort normal and breath sounds normal. No respiratory distress. He has no wheezes.  Abdominal: Soft. He exhibits no distension. There  is no tenderness. There is no rebound and no guarding.  Musculoskeletal: Normal range of motion. He exhibits no edema or tenderness.  Neurological: He is alert and oriented to person, place, and time. No cranial nerve deficit. He exhibits normal muscle tone. Coordination normal.  -tremors  Skin: Skin is warm. No rash noted. No pallor.  Psychiatric: He has a normal mood and affect. His behavior is normal.  Nursing note and vitals reviewed.    ED Treatments / Results  DIAGNOSTIC STUDIES: Oxygen Saturation is 100% on RA, normal by my interpretation.    COORDINATION OF CARE: 11:11 PM Discussed treatment plan with pt at bedside and pt agreed to plan. Will  Rx medication and discharge.  Labs (all labs ordered are listed, but only abnormal results are displayed) Labs Reviewed - No data to display  EKG  EKG Interpretation None       Radiology No results found.  Procedures Procedures (including critical care time)  Medications Ordered in ED Medications  chlordiazePOXIDE (LIBRIUM) capsule 10 mg (not administered)     Initial Impression / Assessment and Plan / ED Course  I have reviewed the triage vital signs and the nursing notes.  Pertinent labs & imaging results that were available during my care of the patient were reviewed by me and considered in my medical decision making (see chart for details).    Patient reports he is out of his chlordiazepoxide the past 2 days. He takes this twice daily for 40 years due to anxiety. There was a mix up at the pharmacy and he is not able to get it refilled until Monday. He denies any suicidal thoughts.  Patient has no prescriptions in the narcotic database. He states he will be able to pick this up on Monday. He will be given a 3 day supply to prevent withdrawals. No evidence of life-threatening withdrawal at this time. Follow up with PCP. Return precautions discussed.   Final Clinical Impressions(s) / ED Diagnoses   Final diagnoses:    Medication refill    New Prescriptions New Prescriptions   No medications on file   I personally performed the services described in this documentation, which was scribed in my presence. The recorded information has been reviewed and is accurate.    Glynn Octave, MD 09/03/16 2330

## 2016-12-26 DIAGNOSIS — H401190 Primary open-angle glaucoma, unspecified eye, stage unspecified: Secondary | ICD-10-CM | POA: Insufficient documentation

## 2017-09-16 IMAGING — DX DG ABDOMEN 1V
1 series · 1 of 1 positions shown · non-contrast
Comparison: 08/03/2010

CLINICAL DATA: Constipation for 3 days. Abdominal fullness.
Colonoscopy 1 month ago with normal results.

EXAM:
ABDOMEN - 1 VIEW

[abdomen kub]
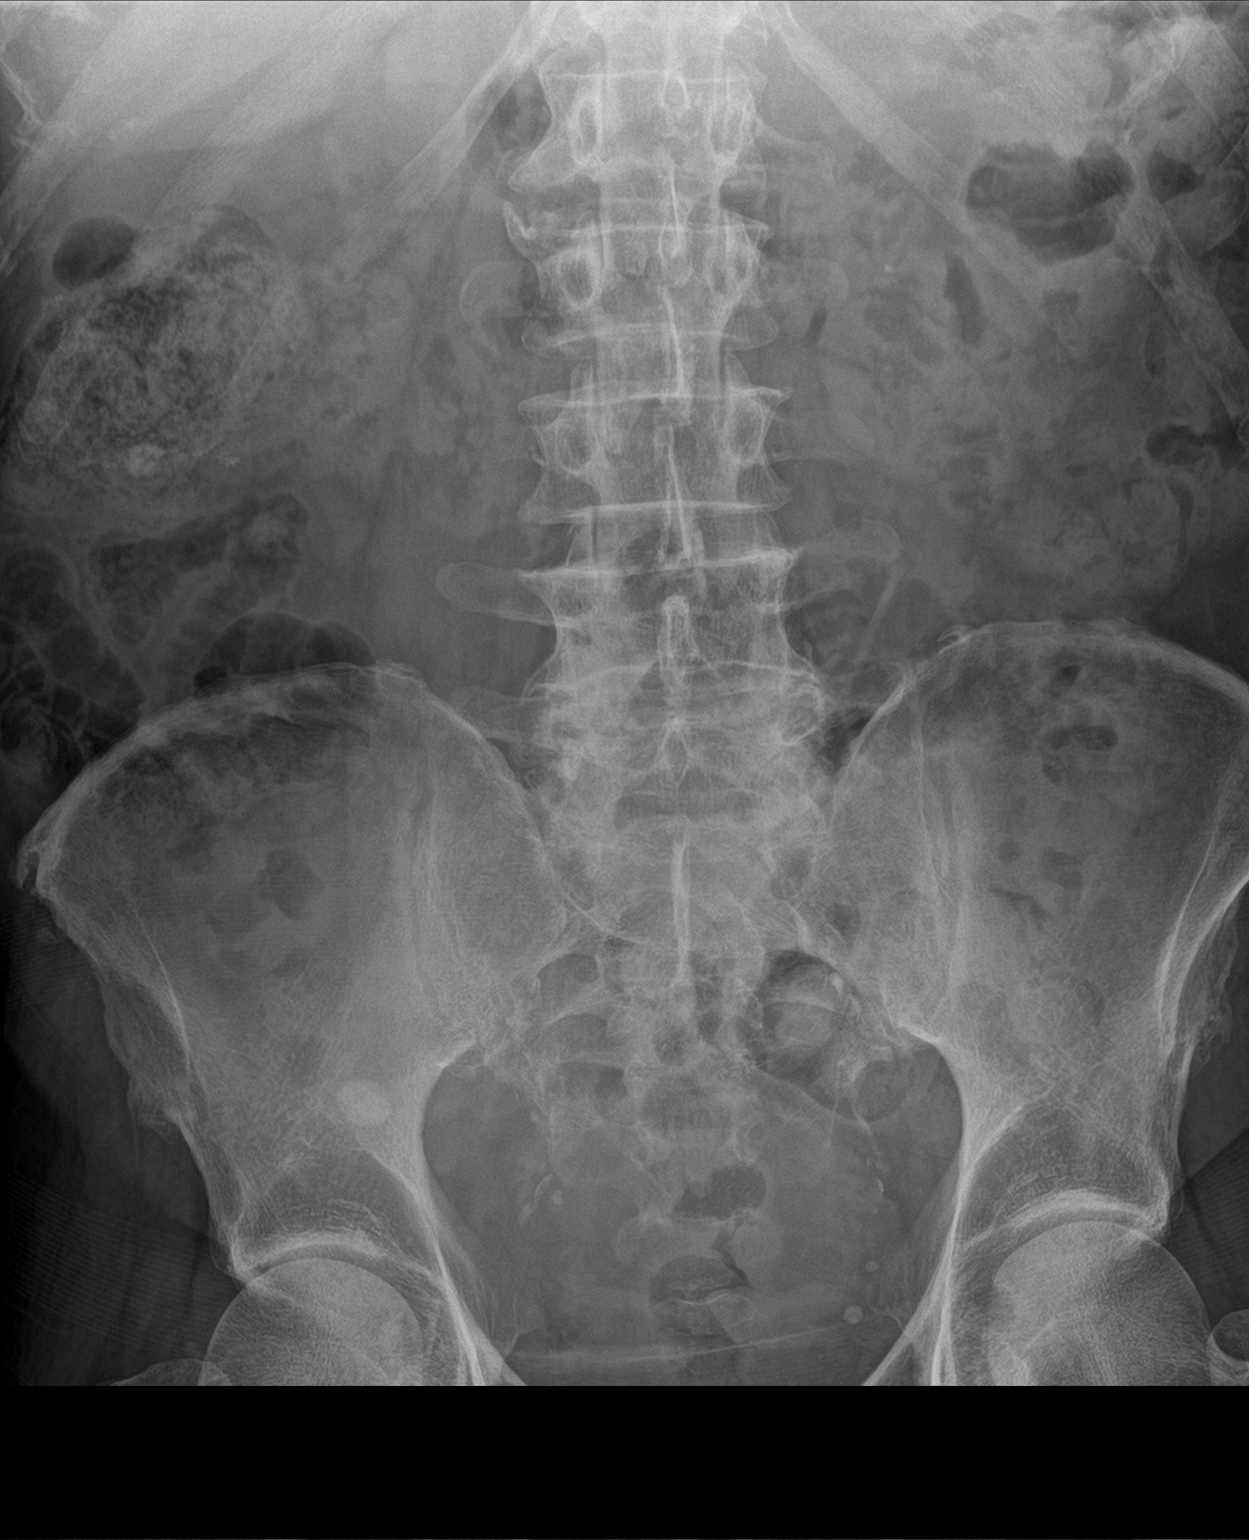

[1 of 1 positions shown; findings below may reference images not displayed]

FINDINGS: Gas and stool throughout the colon. No small or large bowel
distention. No radiopaque stones. Surgical clips in the right
abdomen. Vascular calcifications. Probable opaque medication
projected over the right pelvis. Degenerative changes in the spine.
IMPRESSION: Nonobstructive bowel gas pattern with stool-filled colon.

## 2017-10-04 ENCOUNTER — Other Ambulatory Visit: Payer: Self-pay

## 2017-10-04 ENCOUNTER — Encounter (HOSPITAL_COMMUNITY): Payer: Self-pay | Admitting: Emergency Medicine

## 2017-10-04 ENCOUNTER — Emergency Department (HOSPITAL_COMMUNITY)
Admission: EM | Admit: 2017-10-04 | Discharge: 2017-10-04 | Disposition: A | Discharge status: Home / Self Care | Payer: Medicare Other | Attending: Emergency Medicine | Admitting: Emergency Medicine

## 2017-10-04 DIAGNOSIS — I2510 Atherosclerotic heart disease of native coronary artery without angina pectoris: Secondary | ICD-10-CM | POA: Insufficient documentation

## 2017-10-04 DIAGNOSIS — Z955 Presence of coronary angioplasty implant and graft: Secondary | ICD-10-CM | POA: Diagnosis not present

## 2017-10-04 DIAGNOSIS — Z7982 Long term (current) use of aspirin: Secondary | ICD-10-CM | POA: Diagnosis not present

## 2017-10-04 DIAGNOSIS — Z79899 Other long term (current) drug therapy: Secondary | ICD-10-CM | POA: Diagnosis not present

## 2017-10-04 DIAGNOSIS — I10 Essential (primary) hypertension: Secondary | ICD-10-CM | POA: Diagnosis present

## 2017-10-04 LAB — I-STAT CHEM 8, ED
BUN: 14 mg/dL (ref 6–20)
CREATININE: 1.5 mg/dL — AB (ref 0.61–1.24)
Calcium, Ion: 1.17 mmol/L (ref 1.15–1.40)
Chloride: 101 mmol/L (ref 101–111)
Glucose, Bld: 94 mg/dL (ref 65–99)
HEMATOCRIT: 42 % (ref 39.0–52.0)
HEMOGLOBIN: 14.3 g/dL (ref 13.0–17.0)
Potassium: 4.4 mmol/L (ref 3.5–5.1)
Sodium: 138 mmol/L (ref 135–145)
TCO2: 27 mmol/L (ref 22–32)

## 2017-10-04 NOTE — ED Triage Notes (Signed)
Pt states his blood pressure at home was 178/102. Pt has no other complaints.

## 2017-10-04 NOTE — Discharge Instructions (Addendum)
You were seen today with concerns for high blood pressure.  You are otherwise asymptomatic.  Your EKG and basic metabolic panel are reassuring.  Follow-up with your primary physician if you note trending of her blood pressure upward.

## 2017-10-04 NOTE — ED Provider Notes (Signed)
[facility] EMERGENCY DEPARTMENT Provider Note   CSN: [id number] Arrival date & time: 10/04/17  4098     History   Chief Complaint Chief Complaint  Patient presents with  . Hypertension    HPI Samuel Wilkerson is a 84 y.o. male.  HPI  This is an 84 year old male with a history of coronary artery disease, hypertension, hyperlipidemia who presents with high blood pressure.  Patient reports he takes his blood pressure daily.  This morning he noted his blood pressure to be the 8/102 which is high for him.  He denies any other symptoms.  He denies chest pain, shortness of breath.  He does report recent tooth extraction for which she is on antibiotics.  He is currently taking Augmentin.  He denies taking any over-the-counter medications.  He states he takes his blood pressure daily because last summer he began to have low blood pressures and his medications had to be adjusted.  He does report compliance with his medications and has taken his blood pressure medications this morning.  He denies headache or vomiting.  Past Medical History:  Diagnosis Date  . Coronary artery disease   . Hyperlipidemia   . Hypertension     There are no active problems to display for this patient.   Past Surgical History:  Procedure Laterality Date  . CARDIAC CATHETERIZATION    . CORONARY ANGIOPLASTY    . TONSILLECTOMY          Home Medications    Prior to Admission medications   Medication Sig Start Date End Date Taking? Authorizing Provider  albuterol (PROVENTIL HFA;VENTOLIN HFA) 108 (90 Base) MCG/ACT inhaler Inhale 1-2 puffs into the lungs every 6 (six) hours as needed for wheezing or shortness of breath. 07/10/16   Ward, Layla Maw, DO  amLODipine (NORVASC) 10 MG tablet Take 5 mg by mouth daily.     [provider]  amoxicillin (AMOXIL) 500 MG capsule Take 1 capsule (500 mg total) by mouth 2 (two) times daily. 06/17/16   Zadie Rhine, MD  aspirin EC 81 MG tablet Take 81 mg by mouth  at bedtime.     [provider]  chlordiazePOXIDE (LIBRIUM) 10 MG capsule Take 1 capsule (10 mg total) by mouth at bedtime. Takes daily.  Can increase to twice daily if needed. 09/03/16   Rancour, Jeannett Senior, MD  dorzolamide (TRUSOPT) 2 % ophthalmic solution Place 1 drop into both eyes 2 (two) times daily.    [provider]  fluticasone (FLONASE) 50 MCG/ACT nasal spray Place 1 spray into both nostrils daily. 07/10/16   Ward, Layla Maw, DO  guaifenesin (ROBITUSSIN) 100 MG/5ML syrup Take 5-10 mLs (100-200 mg total) by mouth every 4 (four) hours as needed for cough. 07/10/16   Ward, Layla Maw, DO  hydrochlorothiazide (HYDRODIURIL) 50 MG tablet Take 50 mg by mouth.      [provider]  hydrocortisone-pramoxine [facility]) rectal foam Place 1 applicator rectally as needed for hemorrhoids or itching.    [provider]  latanoprost (XALATAN) 0.005 % ophthalmic solution Place 1 drop into both eyes at bedtime.    [provider]  lisinopril (PRINIVIL,ZESTRIL) 20 MG tablet Take 10 mg by mouth daily.     [provider]  metoprolol (TOPROL-XL) 50 MG 24 hr tablet Take 50 mg by mouth 2 (two) times daily.      [provider]  naproxen (NAPROSYN) 375 MG tablet Take 375 mg by mouth 2 (two) times daily with a meal.  [provider]  niacin 500 MG tablet Take 500 mg by mouth at bedtime.     [provider]  nitroGLYCERIN (NITROSTAT) 0.4 MG SL tablet Place 0.4 mg under the tongue every 5 (five) minutes as needed. For chest pain     [provider]  omeprazole (PRILOSEC) 20 MG capsule Take 20 mg by mouth 2 (two) times daily before a meal.    [provider]  ondansetron (ZOFRAN ODT) 4 MG disintegrating tablet 4mg  ODT q4 hours prn nausea/vomit 11/11/15   Bethann BerkshireZammit, Joseph, MD  ondansetron (ZOFRAN) 8 MG tablet Take 4-8 mg by mouth every 8 (eight) hours as needed for nausea or vomiting.    [provider]    polyethylene glycol (MIRALAX / GLYCOLAX) packet Take 17 g by mouth daily.      [provider]  potassium chloride (KLOR-CON) 8 MEQ CR tablet Take 10 mEq by mouth daily.     [provider]  rosuvastatin (CRESTOR) 40 MG tablet Take 20 mg by mouth daily.      [provider]  travoprost, benzalkonium, (TRAVATAN) 0.004 % ophthalmic solution Place 1 drop into both eyes at bedtime.      [provider]    Family History No family history on file.  Social History Social History   Tobacco Use  . Smoking status: Never Smoker  . Smokeless tobacco: Never Used  Substance Use Topics  . Alcohol use: No    Alcohol/week: 0.0 oz  . Drug use: No     Allergies   Patient has no known allergies.   Review of Systems Review of Systems  Constitutional: Negative for fever.  Respiratory: Negative for shortness of breath.   Cardiovascular: Negative for chest pain.  Gastrointestinal: Negative for abdominal pain.  Neurological: Negative for speech difficulty, weakness, numbness and headaches.  All other systems reviewed and are negative.    Physical Exam Updated Vital Signs BP (!) 155/87 (BP Location: Right Arm)   Pulse 67   Temp 97.8 F (36.6 C)   Resp 16   Ht 6\' 4"  (1.93 m)   Wt 93 kg (205 lb)   SpO2 98%   BMI 24.95 kg/m   Physical Exam  Constitutional: He is oriented to person, place, and time. He appears well-developed and well-nourished. No distress.  HENT:  Head: Normocephalic and atraumatic.  Cardiovascular: Normal rate, regular rhythm and normal heart sounds.  No murmur heard. Pulmonary/Chest: Effort normal and breath sounds normal. No respiratory distress. He has no wheezes.  Abdominal: Soft. Bowel sounds are normal. There is no tenderness. There is no rebound.  Musculoskeletal: He exhibits no edema.  Neurological: He is alert and oriented to person, place, and time.  Skin: Skin is warm and dry.  Psychiatric: He has a normal mood and  affect.  Nursing note and vitals reviewed.    ED Treatments / Results  Labs (all labs ordered are listed, but only abnormal results are displayed) Labs Reviewed  I-STAT CHEM 8, ED - Abnormal; Notable for the following components:      Result Value   Creatinine, Ser 1.50 (*)    All other components within normal limits    EKG EKG Interpretation  Date/Time:  Wednesday October 04 2017 06:47:24 EDT Ventricular Rate:  70 PR Interval:    QRS Duration: 96 QT Interval:  396 QTC Calculation: 428 R Axis:   26 Text Interpretation:  Sinus rhythm Abnormal R-wave progression, early transition Confirmed by Ross MarcusHorton, Courtney ([id number]) on  10/04/2017 6:49:26 AM   Radiology No results found.  Procedures Procedures (including critical care time)  Medications Ordered in ED Medications - No data to display   Initial Impression / Assessment and Plan / ED Course  I have reviewed the triage vital signs and the nursing notes.  Pertinent labs & imaging results that were available during my care of the patient were reviewed by me and considered in my medical decision making (see chart for details).     Patient presents with concerns for isolated high blood pressure.  He denies any symptoms at this time.  Denies any other medication use.  Reports compliance with his blood pressure medications.  He is overall nontoxic appearing and exam is benign.  EKG is reassuring and basic metabolic panel shows no evidence of immediate endorgan injury.  Repeat blood pressure 155/87.  Patient remains asymptomatic.  Discussed the results with the patient.  At this time he has no evidence of hypertensive urgency or emergency.  Recommend follow-up with his primary physician if he notes trends of his blood pressure upwards.  After history, exam, and medical workup I feel the patient has been appropriately medically screened and is safe for discharge home. Pertinent diagnoses were discussed with the patient. Patient was  given return precautions.   Final Clinical Impressions(s) / ED Diagnoses   Final diagnoses:  Essential hypertension    ED Discharge Orders    None       Shon Baton, MD 10/04/17 [phone]

## 2018-05-22 DIAGNOSIS — H2640 Unspecified secondary cataract: Secondary | ICD-10-CM | POA: Insufficient documentation

## 2018-05-23 ENCOUNTER — Ambulatory Visit (INDEPENDENT_AMBULATORY_CARE_PROVIDER_SITE_OTHER): Payer: Medicare Other | Admitting: Podiatry

## 2018-05-23 ENCOUNTER — Encounter: Payer: Self-pay | Admitting: Podiatry

## 2018-05-23 DIAGNOSIS — B351 Tinea unguium: Secondary | ICD-10-CM

## 2018-05-23 DIAGNOSIS — M79674 Pain in right toe(s): Secondary | ICD-10-CM

## 2018-05-23 DIAGNOSIS — M79675 Pain in left toe(s): Secondary | ICD-10-CM | POA: Diagnosis not present

## 2018-05-23 NOTE — Progress Notes (Signed)
Subjective: Samuel Wilkerson presents today with cc of thick, discolored toenails b/l 1-5. Duration greater than 2 years. He voices pain when wearing enclosed shoe gear which makes it difficult for him to walk comfortably. He relates having treatment by a foot professional some time ago and can't quite recall the exact time. He uses Miconazole Cream prescribed by this foot professional and still has "plenty of cream".  He also has painful corns b/l 5th digits causing pain in his shoe gear.   Medical History   Date Unknown Coronary artery disease  Date Unknown Hyperlipidemia  Date Unknown Hypertension  Open angle glaucoma  Surgical History   Date Unknown Cardiac catheterization  Date Unknown Coronary angioplasty  Date Unknown Tonsillectomy      Medications:  albuterol (PROVENTIL HFA;VENTOLIN HFA) 108 (90 Base) MCG/ACT inhaler    amLODipine (NORVASC) 10 MG tablet   aspirin EC 81 MG tablet  cetirizine (ZYRTEC) 10 MG tablet    chlordiazePOXIDE (LIBRIUM) 10 MG capsule    Cholecalciferol (VITAMIN D3) 25 MCG (1000 UT) CAPS    dorzolamide (TRUSOPT) 2 % ophthalmic solution    fluticasone (FLONASE) 50 MCG/ACT nasal spray    guaifenesin (ROBITUSSIN) 100 MG/5ML syrup    hydrochlorothiazide (HYDRODIURIL) 50 MG tablet    hydrocortisone-pramoxine (PROCTOFOAM-HC) rectal foam    hydrophilic ointment    latanoprost (XALATAN) 0.005 % ophthalmic solution    lisinopril (PRINIVIL,ZESTRIL) 20 MG tablet    loratadine (CLARITIN) 10 MG tablet    metoprolol (TOPROL-XL) 50 MG 24 hr tablet    metoprolol tartrate (LOPRESSOR) 50 MG tablet     montelukast (SINGULAIR) 10 MG tablet    naproxen (NAPROSYN) 375 MG tablet    Allergies      No Known Allergies   Tobacco History   Smoking Status  Never Smoker  Smokeless Tobacco Status  Never Used   Family History   Sister Diabetes          Brother    Review of systems: Constitutional: Denies chills fatigue fever sweats weight change Eyes: Denies diplopia  glare light sensitivity Ears nose mouth throat: Denies vertigo denies bloody nose rhinitis denies cold sores and snoring Cardiovascular: Denies chest pain tightness; +HTN Respiratory: Denies difficulty breathing, denies congestion Gastrointestinal: Denies abdominal pain, diarrhea, nausea, vomiting Genitourinary: Denies nocturia, pain on urination, blood in urine Musculoskeletal: Denies cramping Skin: +changes in toenails, denies color change dryness, itchy skin, mole changes, or rash  Neurological: Denies fainting, denies seizure, denies change in speech.  Positive for headaches periodically Endocrine: Denies dry mouth, denies flushing, denies heat intolerance, denies cold intolerance, denies excessive thirst, denies polyuria, denies nocturia Hematological: Denies easy bleeding, excessive bleeding, easy bruising, denies enlarged lymph nodes Allergy/immunological: Denies hives denies frequent infections  Objective:  Vascular Examination: Capillary refill time <3 seconds x 10 digits Dorsalis pedis pulses faintly palpable b/l Posterior tibial pulses faintly palpable b/l No digital hair x 10 digits Skin temperature warm to cool b/l Trace edema b/l ankles  Dermatological Examination: Skin with mild atrophy noted b/l Toenails 1-5 b/l discolored, thick, dystrophic with subungual debris and pain with palpation to nailbeds due to thickness of nails.  Musculoskeletal: Muscle strength 5/5 to all LE muscle groups Hammertoe deformity 2-5 b/l  Neurological: Sensation intact with 10 gram monofilament b/l Vibratory sensation intact b/l  Assessment: 1. Painful onychomycosis toenails 1-5 b/l  Plan: 1. Toenails 1-5 b/l were debrided in length and girth without iatrogenic bleeding.  2. Literature dispensed on today's visit. 3. Patient to continue  soft, supportive shoe gear 4. Patient to report any pedal injuries to medical professional  5. Follow up 3 months.  6. Patient/POA to call should  there be a concern in the interim.

## 2018-05-23 NOTE — Patient Instructions (Signed)

## 2018-08-15 ENCOUNTER — Encounter: Payer: Self-pay | Admitting: Allergy & Immunology

## 2018-08-15 ENCOUNTER — Ambulatory Visit (INDEPENDENT_AMBULATORY_CARE_PROVIDER_SITE_OTHER): Payer: PRIVATE HEALTH INSURANCE | Admitting: Allergy & Immunology

## 2018-08-15 VITALS — BP 116/64 | HR 76 | Temp 97.5°F | Resp 14 | Ht 76.0 in | Wt 204.6 lb

## 2018-08-15 DIAGNOSIS — J310 Chronic rhinitis: Secondary | ICD-10-CM

## 2018-08-15 MED ORDER — IPRATROPIUM BROMIDE 0.03 % NA SOLN: 1.0000 | Freq: Every morning | NASAL | 2 refills | Status: DC

## 2018-08-15 NOTE — Progress Notes (Signed)
NEW PATIENT  Date of Service/Encounter:  08/15/18  Referring provider: [name], MD   Assessment:   Chronic rhinitis  Complicated past medical history, including ischemic brain injury   Plan/Recommendations:   1. Chronic rhinitis - We will get some labs to look for environmental allergies. - We will call you in 1-2 weeks with the results of the testing. - In the meantime, I recommend that you use the fluticasone EVERY DAY for the best effects. - Also start nasal ipratropium one spray per nostril EVERY MORNING to help dry out of your nose and prevent the postnasal drip. - Continue with cetirizine 32m EVERY DAY.   2. Return in about 3 months (around 11/13/2018).  Subjective:   Samuel Wilkerson a 85y.o. male presenting today for evaluation of  Chief Complaint  Patient presents with  . Allergic Rhinitis     biggest complaint is post nasal drip    Samuel Wilkerson has a history of the following: Patient Active Problem List   Diagnosis Date Noted  . Primary open-angle glaucoma 12/26/2016    History obtained from: chart review and patient, who is a poor historian.  Samuel Wilkerson was referred by KLemmie Evens MD.     Samuel Wilkerson a 85y.o. male presenting for chronic rhinitis. He is on a pill that he takes at night (cetirizine 142m. He is also on fluticasone as night as well. This  This is providing some relief.  Review of his referral notes show that he has had a sinus CT that was negative in September 2019.  He is not the best historian and takes quite a while to answer many questions.  He does think that he uses his medications regularly, but with talking more with him it seems that he only uses the fluticasone as needed.  It is not really clear when he decides to actually use it.  He was not under the impression that it had to be used on a daily basis.  He does not think that he gets antibiotics regularly.  He did have a T&A when he was [age] old.   Otherwise, he has had no nasal surgery.  He does not think that he has seen an otolaryngologist.  He had a brain CT that demonstrated small vessel ischemic changes in his brain.   Otherwise, there is no history of other atopic diseases, including asthma, food allergies, drug allergies, stinging insect allergies, eczema or urticaria. There is no significant infectious history. Vaccinations are up to date. 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 He was in the [employer] for two years. [place]    Past Medical History: Patient Active Problem List   Diagnosis Date Noted  . Primary open-angle glaucoma 12/26/2016    Medication List:  Allergies as of 08/15/2018   No Known Allergies     Medication List       Accurate as of August 15, 2018  1:29 PM. Always use your most recent med list.        albuterol 108 (90 Base) MCG/ACT inhaler Commonly known as:  PROVENTIL HFA;VENTOLIN HFA Inhale 1-2 puffs into the lungs every 6 (six) hours as needed for wheezing or shortness of breath.   amLODipine 10 MG tablet Commonly known as:  NORVASC Take 5 mg by mouth daily.   amoxicillin 500 MG capsule Commonly known as:  AMOXIL Take  1 capsule (500 mg total) by mouth 2 (two) times daily.   amoxicillin-clavulanate 875-125 MG tablet Commonly known as:  AUGMENTIN TK 1 T PO BID FOR INFECTION   aspirin EC 81 MG tablet Take 81 mg by mouth at bedtime.   azithromycin 250 mg in dextrose 5 % 125 mL Place 1 drop into both eyes nightly.   cephALEXin 500 MG capsule Commonly known as:  KEFLEX TK ONE C PO  Q 8 H FOR 10 DAYS   cetirizine 10 MG tablet Commonly known as:  ZYRTEC Take 10 mg by mouth daily.   chlordiazePOXIDE 10 MG capsule Commonly known as:  LIBRIUM Take 1 capsule (10 mg total) by mouth at bedtime. Takes daily.  Can increase to twice daily if needed.   dorzolamide 2 % ophthalmic solution Commonly known as:  TRUSOPT Place 1 drop into both  eyes 2 (two) times daily.   fluticasone 50 MCG/ACT nasal spray Commonly known as:  FLONASE Place 1 spray into both nostrils daily.   guaifenesin 100 MG/5ML syrup Commonly known as:  ROBITUSSIN Take 5-10 mLs (100-200 mg total) by mouth every 4 (four) hours as needed for cough.   hydrochlorothiazide 50 MG tablet Commonly known as:  HYDRODIURIL Take 50 mg by mouth.   hydrocortisone-pramoxine rectal foam Commonly known as:  PROCTOFOAM-HC Place 1 applicator rectally as needed for hemorrhoids or itching.   hydrophilic ointment Apply topically.   ipratropium 0.03 % nasal spray Commonly known as:  ATROVENT Place 1 spray into both nostrils every morning.   latanoprost 0.005 % ophthalmic solution Commonly known as:  XALATAN Place 1 drop into both eyes at bedtime.   lisinopril 20 MG tablet Commonly known as:  PRINIVIL,ZESTRIL Take 10 mg by mouth daily.   loratadine 10 MG tablet Commonly known as:  CLARITIN Take by mouth.   metoprolol succinate 50 MG 24 hr tablet Commonly known as:  TOPROL-XL Take 50 mg by mouth 2 (two) times daily.   Miconazole Nitrate 2 % Kit Apply topically.   montelukast 10 MG tablet Commonly known as:  SINGULAIR Take 10 mg by mouth at bedtime.   naproxen 375 MG tablet Commonly known as:  NAPROSYN Take 375 mg by mouth 2 (two) times daily with a meal.   niacin 500 MG tablet Take 500 mg by mouth at bedtime.   nitroGLYCERIN 0.4 MG SL tablet Commonly known as:  NITROSTAT Place 0.4 mg under the tongue every 5 (five) minutes as needed. For chest pain   omeprazole 20 MG capsule Commonly known as:  PRILOSEC Take 20 mg by mouth 2 (two) times daily before a meal.   ondansetron 4 MG disintegrating tablet Commonly known as:  ZOFRAN ODT 48m ODT q4 hours prn nausea/vomit   ondansetron 8 MG tablet Commonly known as:  ZOFRAN Take 4-8 mg by mouth every 8 (eight) hours as needed for nausea or vomiting.   polyethylene glycol packet Commonly known as:   MIRALAX / GLYCOLAX Take 17 g by mouth daily.   potassium chloride 8 MEQ CR tablet Commonly known as:  KLOR-CON Take 10 mEq by mouth daily.   pramoxine-hydrocortisone 1-1 % foam Apply topically.   rosuvastatin 40 MG tablet Commonly known as:  CRESTOR Take 20 mg by mouth daily.   terbinafine 1 % cream Commonly known as:  LAMISIL Apply topically.   travoprost (benzalkonium) 0.004 % ophthalmic solution Commonly known as:  TRAVATAN Place 1 drop into both eyes at bedtime.   triamcinolone cream 0.5 % Commonly known as:  KENALOG Apply 1 application topically 3 (three) times daily.   Vitamin D3 25 MCG (1000 UT) Caps Take 1,000 Units by mouth.       Birth History: non-contributory  Developmental History: non-contributory.   Past Surgical History: Past Surgical History:  Procedure Laterality Date  . CARDIAC CATHETERIZATION    . CORONARY ANGIOPLASTY    . TONSILLECTOMY       Family History: Family History  Problem Relation Age of Onset  . Diabetes Sister        b/l below knee amputations     Social History: Khylin lives at home with electric heating and central cooling.  There are no animals inside of his home.  He is retired.  He has never been a smoker.  There are no dust mite coverings on his bedding.     Review of Systems: a 14-point review of systems is pertinent for what is mentioned in HPI.  Otherwise, all other systems were negative. Constitutional: negative other than that listed in the HPI Eyes: negative other than that listed in the HPI Ears, nose, mouth, throat, and face: negative other than that listed in the HPI Respiratory: negative other than that listed in the HPI Cardiovascular: negative other than that listed in the HPI Gastrointestinal: negative other than that listed in the HPI Genitourinary: negative other than that listed in the HPI Integument: negative other than that listed in the HPI Hematologic: negative other than that listed in the  HPI Musculoskeletal: negative other than that listed in the HPI Neurological: negative other than that listed in the HPI Allergy/Immunologic: negative other than that listed in the HPI    Objective:   Blood pressure 116/64, pulse 76, temperature (!) 97.5 F (36.4 C), temperature source Oral, resp. rate 14, height _0  (1.93 m), weight 204 lb 9.6 oz (92.8 kg). Body mass index is 24.9 kg/m.   Physical Exam:  General: Alert, interactive, in no acute distress.  Takes a while to answer questions. Eyes: No conjunctival injection bilaterally, no discharge on the right, no discharge on the left and no Horner-Trantas dots present. PERRL bilaterally. EOMI without pain. No photophobia.  Ears: Right TM pearly gray with normal light reflex, Left TM pearly gray with normal light reflex, Right TM intact without perforation and Left TM intact without perforation.  Nose/Throat: External nose within normal limits and septum midline. Turbinates minimally edematous without discharge. Posterior oropharynx mildly erythematous without cobblestoning in the posterior oropharynx. Tonsils 2+ without exudates.  Tongue without thrush. Neck: Supple without thyromegaly. Trachea midline. Adenopathy: no enlarged lymph nodes appreciated in the anterior cervical, occipital, axillary, epitrochlear, inguinal, or popliteal regions. Lungs: Clear to auscultation without wheezing, rhonchi or rales. No increased work of breathing. CV: Normal S1/S2. No murmurs. Capillary refill <2 seconds.  Abdomen: Nondistended, nontender. No guarding or rebound tenderness. Bowel sounds present in all fields and hypoactive  Skin: Warm and dry, without lesions or rashes. Extremities:  No clubbing, cyanosis or edema. Neuro:   Grossly intact. No focal deficits appreciated. Responsive to questions.  Diagnostic studies: none (blood work obtained instead)        Salvatore Marvel, MD Allergy and [facility]

## 2018-08-15 NOTE — Patient Instructions (Addendum)
1. Chronic rhinitis - We will get some labs to look for environmental allergies. - We will call you in 1-2 weeks with the results of the testing. - In the meantime, I recommend that you use the fluticasone EVERY DAY for the best effects. - Also start nasal ipratropium one spray per nostril EVERY MORNING to help dry out of your nose and prevent the postnasal drip. - Continue with cetirizine 10mg  EVERY DAY.   2. Return in about 3 months (around 11/13/2018).   Please inform us of any Emergency Department visits, hospitalizations, or changes in symptoms. Call us before going to the ED for breathing or allergy symptoms since we might be able to fit you in for a sick visit. Feel free to contact us anytime with any questions, problems, or concerns.  It was a pleasure to meet you today!  Websites that have reliable patient information: 1. American Academy of Asthma, Allergy, and Immunology: www.aaaai.org 2. Food Allergy Research and Education (FARE): foodallergy.org 3. Mothers of Asthmatics: http://www.asthmacommunitynetwork.org 4. American College of Allergy, Asthma, and Immunology: MissingWeapons.ca   Make sure you are registered to vote! If you have moved or changed any of your contact information, you will need to get this updated before voting!    Voter ID laws are POSSIBLY going into effect for the General Election in November 2020! Be prepared! Check out [employer]aping[employer].dk for more details.

## 2018-08-19 LAB — IGE+ALLERGENS ZONE 2(30)
Amer Sycamore IgE Qn: <0.1 kU/L
Aspergillus Fumigatus IgE: 0.27 kU/L — AB
Bahia Grass IgE: <0.1 kU/L
Cat Dander IgE: <0.1 kU/L
Cladosporium Herbarum IgE: 0.16 kU/L — AB
Cockroach, American IgE: 1.3 kU/L — AB
Common Silver Birch IgE: <0.1 kU/L
D Farinae IgE: 2.22 kU/L — AB
D001-IGE D PTERONYSSINUS: 2.06 kU/L — AB
Dog Dander IgE: 0.11 kU/L — AB
Elm, American IgE: <0.1 kU/L
IGE (IMMUNOGLOBULIN E), SERUM: 344 [IU]/mL (ref 6–495)
Johnson Grass IgE: <0.1 kU/L
M001-IGE PENICILLIUM CHRYSOGEN: 0.47 kU/L — AB
M010-IGE STEMPHYLIUM HERBARUM: 0.21 kU/L — AB
Maple/Box Elder IgE: <0.1 kU/L
Mucor Racemosus IgE: <0.1 kU/L
Mugwort IgE Qn: 0.1 kU/L — AB
Nettle IgE: <0.1 kU/L
Oak, White IgE: 0.1 kU/L — AB
Pigweed, Rough IgE: 0.25 kU/L — AB
Sheep Sorrel IgE Qn: <0.1 kU/L
Sweet gum IgE RAST Ql: <0.1 kU/L
T006-IGE CEDAR, MOUNTAIN: 0.17 kU/L — AB
W009-IGE PLANTAIN, ENGLISH: 0.1 kU/L — AB
White Mulberry IgE: <0.1 kU/L

## 2018-08-23 ENCOUNTER — Ambulatory Visit: Payer: Non-veteran care | Admitting: Podiatry

## 2018-09-04 ENCOUNTER — Ambulatory Visit (INDEPENDENT_AMBULATORY_CARE_PROVIDER_SITE_OTHER): Payer: PRIVATE HEALTH INSURANCE | Admitting: Podiatry

## 2018-09-04 DIAGNOSIS — B351 Tinea unguium: Secondary | ICD-10-CM

## 2018-09-04 DIAGNOSIS — M79674 Pain in right toe(s): Secondary | ICD-10-CM

## 2018-09-04 DIAGNOSIS — M79675 Pain in left toe(s): Secondary | ICD-10-CM | POA: Diagnosis not present

## 2018-09-04 NOTE — Patient Instructions (Signed)

## 2018-09-08 ENCOUNTER — Encounter: Payer: Self-pay | Admitting: Podiatry

## 2018-09-08 NOTE — Progress Notes (Signed)
Subjective: Samuel Wilkerson presents today with painful, thick toenails 1-5 b/l that he cannot cut and which interfere with daily activities.  Pain is aggravated when wearing enclosed shoe gear.  [name], MD is his PCP.   Current Outpatient Medications:  .  albuterol (PROVENTIL HFA;VENTOLIN HFA) 108 (90 Base) MCG/ACT inhaler, Inhale 1-2 puffs into the lungs every 6 (six) hours as needed for wheezing or shortness of breath., Disp: 1 Inhaler, Rfl: 0 .  amLODipine (NORVASC) 10 MG tablet, Take 5 mg by mouth daily. , Disp: , Rfl:  .  amoxicillin (AMOXIL) 500 MG capsule, Take 1 capsule (500 mg total) by mouth 2 (two) times daily., Disp: 10 capsule, Rfl: 0 .  amoxicillin-clavulanate (AUGMENTIN) 875-125 MG tablet, TK 1 T PO BID FOR INFECTION, Disp: , Rfl: 0 .  aspirin EC 81 MG tablet, Take 81 mg by mouth at bedtime. , Disp: , Rfl:  .  azithromycin 250 mg in dextrose 5 % 125 mL, Place 1 drop into both eyes nightly., Disp: , Rfl:  .  cephALEXin (KEFLEX) 500 MG capsule, TK ONE C PO  Q 8 H FOR 10 DAYS, Disp: , Rfl: 0 .  cetirizine (ZYRTEC) 10 MG tablet, Take 10 mg by mouth daily., Disp: , Rfl:  .  chlordiazePOXIDE (LIBRIUM) 10 MG capsule, Take 1 capsule (10 mg total) by mouth at bedtime. Takes daily.  Can increase to twice daily if needed., Disp: 6 capsule, Rfl: 0 .  Cholecalciferol (VITAMIN D3) 25 MCG (1000 UT) CAPS, Take 1,000 Units by mouth., Disp: , Rfl:  .  dorzolamide (TRUSOPT) 2 % ophthalmic solution, Place 1 drop into both eyes 2 (two) times daily., Disp: , Rfl:  .  fluticasone (FLONASE) 50 MCG/ACT nasal spray, Place 1 spray into both nostrils daily., Disp: 16 g, Rfl: 2 .  guaifenesin (ROBITUSSIN) 100 MG/5ML syrup, Take 5-10 mLs (100-200 mg total) by mouth every 4 (four) hours as needed for cough., Disp: 60 mL, Rfl: 0 .  hydrochlorothiazide (HYDRODIURIL) 50 MG tablet, Take 50 mg by mouth.  , Disp: , Rfl:  .  hydrocortisone-pramoxine (PROCTOFOAM-HC) rectal foam, Place 1 applicator rectally as  needed for hemorrhoids or itching., Disp: , Rfl:  .  hydrophilic ointment, Apply topically., Disp: , Rfl:  .  ipratropium (ATROVENT) 0.03 % nasal spray, Place 1 spray into both nostrils every morning., Disp: 30 mL, Rfl: 2 .  latanoprost (XALATAN) 0.005 % ophthalmic solution, Place 1 drop into both eyes at bedtime., Disp: , Rfl:  .  lisinopril (PRINIVIL,ZESTRIL) 20 MG tablet, Take 10 mg by mouth daily. , Disp: , Rfl:  .  loratadine (CLARITIN) 10 MG tablet, Take by mouth., Disp: , Rfl:  .  metoprolol (TOPROL-XL) 50 MG 24 hr tablet, Take 50 mg by mouth 2 (two) times daily.  , Disp: , Rfl:  .  Miconazole Nitrate 2 % KIT, Apply topically., Disp: , Rfl:  .  montelukast (SINGULAIR) 10 MG tablet, Take 10 mg by mouth at bedtime., Disp: , Rfl:  .  naproxen (NAPROSYN) 375 MG tablet, Take 375 mg by mouth 2 (two) times daily with a meal., Disp: , Rfl:  .  niacin 500 MG tablet, Take 500 mg by mouth at bedtime. , Disp: , Rfl:  .  nitroGLYCERIN (NITROSTAT) 0.4 MG SL tablet, Place 0.4 mg under the tongue every 5 (five) minutes as needed. For chest pain , Disp: , Rfl:  .  omeprazole (PRILOSEC) 20 MG capsule, Take 20 mg by mouth 2 (two) times  daily before a meal., Disp: , Rfl:  .  ondansetron (ZOFRAN ODT) 4 MG disintegrating tablet, 63m ODT q4 hours prn nausea/vomit, Disp: 12 tablet, Rfl: 0 .  ondansetron (ZOFRAN) 8 MG tablet, Take 4-8 mg by mouth every 8 (eight) hours as needed for nausea or vomiting., Disp: , Rfl:  .  polyethylene glycol (MIRALAX / GLYCOLAX) packet, Take 17 g by mouth daily.  , Disp: , Rfl:  .  potassium chloride (KLOR-CON) 8 MEQ CR tablet, Take 10 mEq by mouth daily. , Disp: , Rfl:  .  pramoxine-hydrocortisone 1-1 % foam, Apply topically., Disp: , Rfl:  .  rosuvastatin (CRESTOR) 40 MG tablet, Take 20 mg by mouth daily.  , Disp: , Rfl:  .  terbinafine (LAMISIL) 1 % cream, Apply topically., Disp: , Rfl:  .  travoprost, benzalkonium, (TRAVATAN) 0.004 % ophthalmic solution, Place 1 drop into both  eyes at bedtime.  , Disp: , Rfl:  .  triamcinolone cream (KENALOG) 0.5 %, Apply 1 application topically 3 (three) times daily., Disp: , Rfl:   No Known Allergies  Objective:  Vascular Examination: Capillary refill time <3 seconds  x 10 digits  Dorsalis pedis and Posterior tibial pulses faintly palpable b/l  Digital hair absent x 10 digits  Skin temperature gradient warm to cool b/l  No ischemic changes noted b/l.  Dermatological Examination: Skin with mild atrophy b/l  No open wounds  Toenails 1-5 b/l discolored, thick, dystrophic with subungual debris and pain with palpation to nailbeds due to thickness of nails.  Musculoskeletal: Muscle strength 5/5 to all LE muscle groups  Hammertoes 2-5 b/l.  No pain, crepitus or joint limitation noted with ROM.   Neurological: Sensation intact with 10 gram monofilament. Vibratory sensation intact.  Assessment: Painful onychomycosis toenails 1-5 b/l   Plan: 1. Toenails 1-5 b/l were debrided in length and girth without iatrogenic bleeding. 2. Patient to continue soft, supportive shoe gear. 3. Patient to report any pedal injuries to medical professional immediately. 4. Follow up 3 months.  5. Patient/POA to call should there be a concern in the interim.

## 2018-09-12 ENCOUNTER — Telehealth: Payer: Self-pay

## 2018-09-12 DIAGNOSIS — J310 Chronic rhinitis: Secondary | ICD-10-CM

## 2018-09-12 MED ORDER — IPRATROPIUM BROMIDE 0.03 % NA SOLN: 1.0000 | Freq: Every morning | NASAL | 2 refills | Status: DC

## 2018-09-12 NOTE — Telephone Encounter (Signed)
Pt came in for refill on nasal spray ipratropium. Printed script and had Dr. Dellis Anes sign it and I faxed it to [facility] [phone].

## 2018-10-01 ENCOUNTER — Other Ambulatory Visit: Payer: Self-pay

## 2018-10-01 NOTE — Telephone Encounter (Signed)
Dr. Dellis Anes please advise on this medication

## 2018-10-01 NOTE — Telephone Encounter (Signed)
Attempted to contact pt on both home and mobile phone number.  No VM available, unable to leave a message. 

## 2018-10-01 NOTE — Telephone Encounter (Signed)
Patient walked in to the Endocrinology office to see if Dr Dellis Anes could give him a refill on his Magic Mouth Wash. Kim @ Endo stated he doesn't think we have ever sent this in for him.  [employer] (Fax)

## 2018-10-01 NOTE — Telephone Encounter (Signed)
I definitely never prescribed that. I would recommend that he call his PCP.  Malachi Bonds, MD [facility]

## 2018-10-02 NOTE — Telephone Encounter (Signed)
Attempted to contact pt on both home and mobile phone number.  No VM available, unable to leave a message.

## 2018-10-02 NOTE — Telephone Encounter (Signed)
I spoke with patient and advised him to contact his primary care for this prescription.

## 2018-11-06 ENCOUNTER — Telehealth: Payer: Self-pay

## 2018-11-06 NOTE — Telephone Encounter (Signed)
Patient is calling to see if we can send in Augmentin for his sore throat from nasal drainage. Patient was sitting outside the pharmacy and I advise for him to go home until we see if we can send this in for him. Patient stated he would go home. He also states this really helps him every time he gets this sore throat.   Please Advise     [facility] [address]

## 2018-11-06 NOTE — Telephone Encounter (Signed)
Please advise. Do you want to make him a televisit?

## 2018-11-07 ENCOUNTER — Encounter: Payer: Self-pay | Admitting: Allergy & Immunology

## 2018-11-07 ENCOUNTER — Other Ambulatory Visit: Payer: Self-pay

## 2018-11-07 ENCOUNTER — Ambulatory Visit (INDEPENDENT_AMBULATORY_CARE_PROVIDER_SITE_OTHER): Payer: PRIVATE HEALTH INSURANCE | Admitting: Allergy & Immunology

## 2018-11-07 DIAGNOSIS — J302 Other seasonal allergic rhinitis: Secondary | ICD-10-CM | POA: Insufficient documentation

## 2018-11-07 DIAGNOSIS — J029 Acute pharyngitis, unspecified: Secondary | ICD-10-CM | POA: Diagnosis not present

## 2018-11-07 DIAGNOSIS — J3089 Other allergic rhinitis: Secondary | ICD-10-CM

## 2018-11-07 MED ORDER — AMOXICILLIN-POT CLAVULANATE 875-125 MG PO TABS: 1.0000 | ORAL_TABLET | Freq: Two times a day (BID) | ORAL | 0 refills | Status: AC

## 2018-11-07 NOTE — Progress Notes (Signed)
RE: Samuel Wilkerson MRN: [id number] DOB: 03/21/1931 Date of Telemedicine Visit: 11/07/2018  Referring provider: [name], MD Primary care provider: [name], MD  Chief Complaint: Sore Throat (start phone call at 345, throat has been sore for about three day  )   Telemedicine Follow Up Visit via Telephone: I connected with Samuel Wilkerson for a follow up on 11/07/18 by telephone and verified that I am speaking with the correct person using two identifiers.   I discussed the limitations, risks, security and privacy concerns of performing an evaluation and management service by telephone and the availability of in person appointments. I also discussed with the patient that there may be a patient responsible charge related to this service. The patient expressed understanding and agreed to proceed.  Patient is at home by himself.  Provider is at the office.  Visit start time: 3:43 PM Visit end time: 4:01 PM Insurance consent/check in by: [place] consent and medical assistant/nurse: Lovena Le  History of Present Illness:  He is a 85 y.o. male, who is being followed for chronic rhinitis. His previous allergy office visit was in February 2020 with [name].  At that time, he was placed on nasal Atrovent and Flonase every day.  We also continued with cetirizine 10 mg daily.  We did obtain some lab work to look for environmental allergies and a came back positive for dust mites, dog, cockroach, indoor and outdoor molds, trees, ragweed, and weeds. We sent avoidance measures.   He reports that he has had a sore throat for three days. He started having symptoms Friday or Saturday. He has tried using Veter[facility] surgeon at home, but this has not helped. He has not had a fever at all. He denies coughing, chest pain, or shortness of breath. He denies headache at all. He does report that he has had Augmentin ten days in a row three times since July 2019. Apparently they have tried different  medications, but the Augmentin seems to "work the best". He has gotten scripts from the [place] in [place] and somewhere else in [place]. He denies any sick contacts.   He does not have a history of reflux.  He is not on any proton pump inhibitors.  He has never been evaluated by gastroenterology.  He denies dysphagia.  He remains on the nose sprays that we have given him. He has not been in the hospital or ED since the last time that we saw him. He is using cetirizine on a daily basis. He tells me that he was using everything that I gave him.   Otherwise, there have been no changes to his past medical history, surgical history, family history, or social history. He lives at home with his niece and her husband.   Assessment and Plan:  Imir is a 85 y.o. male with:  Pharyngitis  Perennial and seasonal allergic rhinitis (dust mites, dog, cockroach, indoor and outdoor molds, trees, ragweed, and weeds)   1.  Perennial and seasonal allergic rhinitis (dust mites, dog, cockroach, indoor and outdoor molds, trees, ragweed, and weeds) - Continue with all of your allergy medications.  - In the meantime, I recommend that you use the fluticasone EVERY DAY for the best effects. - Also start nasal ipratropium one spray per nostril EVERY MORNING to help dry out of your nose and prevent the postnasal drip. - Continue with cetirizine 12m EVERY DAY.   2. Acute pharyngitis - Start Augmentin [id number] daily for one week. - Continue  with the tessalon pearls. - Drink tea with honey to help the throat feel better. - If this is not working, give [place] a call and we may need to refer you to GI or ENT for further workup.  3. Return in about 1 year (around 11/07/2019). This can be an in-person, a virtual Webex or a telephone follow up visit.     Diagnostics: None.  Medication List:  Current Outpatient Medications  Medication Sig Dispense Refill  . albuterol (PROVENTIL HFA;VENTOLIN HFA) 108 (90 Base) MCG/ACT  inhaler Inhale 1-2 puffs into the lungs every 6 (six) hours as needed for wheezing or shortness of breath. 1 Inhaler 0  . amLODipine (NORVASC) 10 MG tablet Take 5 mg by mouth daily.     Marland Kitchen amoxicillin (AMOXIL) 500 MG capsule Take 1 capsule (500 mg total) by mouth 2 (two) times daily. 10 capsule 0  . aspirin EC 81 MG tablet Take 81 mg by mouth at bedtime.     . cephALEXin (KEFLEX) 500 MG capsule TK ONE C PO  Q 8 H FOR 10 DAYS  0  . cetirizine (ZYRTEC) 10 MG tablet Take 10 mg by mouth daily.    . chlordiazePOXIDE (LIBRIUM) 10 MG capsule Take 1 capsule (10 mg total) by mouth at bedtime. Takes daily.  Can increase to twice daily if needed. 6 capsule 0  . Cholecalciferol (VITAMIN D3) 25 MCG (1000 UT) CAPS Take 1,000 Units by mouth.    . dorzolamide (TRUSOPT) 2 % ophthalmic solution Place 1 drop into both eyes 2 (two) times daily.    . fluticasone (FLONASE) 50 MCG/ACT nasal spray Place 1 spray into both nostrils daily. 16 g 2  . guaifenesin (ROBITUSSIN) 100 MG/5ML syrup Take 5-10 mLs (100-200 mg total) by mouth every 4 (four) hours as needed for cough. 60 mL 0  . hydrochlorothiazide (HYDRODIURIL) 50 MG tablet Take 50 mg by mouth.      . hydrocortisone-pramoxine (PROCTOFOAM-HC) rectal foam Place 1 applicator rectally as needed for hemorrhoids or itching.    . hydrophilic ointment Apply topically.    Marland Kitchen ipratropium (ATROVENT) 0.03 % nasal spray Place 1 spray into both nostrils every morning. 30 mL 2  . latanoprost (XALATAN) 0.005 % ophthalmic solution Place 1 drop into both eyes at bedtime.    Marland Kitchen lisinopril (PRINIVIL,ZESTRIL) 20 MG tablet Take 10 mg by mouth daily.     Marland Kitchen loratadine (CLARITIN) 10 MG tablet Take by mouth.    . metoprolol (TOPROL-XL) 50 MG 24 hr tablet Take 50 mg by mouth 2 (two) times daily.      . Miconazole Nitrate 2 % KIT Apply topically.    . montelukast (SINGULAIR) 10 MG tablet Take 10 mg by mouth at bedtime.    . naproxen (NAPROSYN) 375 MG tablet Take 375 mg by mouth 2 (two) times  daily with a meal.    . niacin 500 MG tablet Take 500 mg by mouth at bedtime.     . nitroGLYCERIN (NITROSTAT) 0.4 MG SL tablet Place 0.4 mg under the tongue every 5 (five) minutes as needed. For chest pain     . omeprazole (PRILOSEC) 20 MG capsule Take 20 mg by mouth 2 (two) times daily before a meal.    . ondansetron (ZOFRAN ODT) 4 MG disintegrating tablet 37m ODT q4 hours prn nausea/vomit 12 tablet 0  . ondansetron (ZOFRAN) 8 MG tablet Take 4-8 mg by mouth every 8 (eight) hours as needed for nausea or vomiting.    . polyethylene  glycol (MIRALAX / GLYCOLAX) packet Take 17 g by mouth daily.      . potassium chloride (KLOR-CON) 8 MEQ CR tablet Take 10 mEq by mouth daily.     . pramoxine-hydrocortisone 1-1 % foam Apply topically.    . rosuvastatin (CRESTOR) 40 MG tablet Take 20 mg by mouth daily.      Marland Kitchen terbinafine (LAMISIL) 1 % cream Apply topically.    . travoprost, benzalkonium, (TRAVATAN) 0.004 % ophthalmic solution Place 1 drop into both eyes at bedtime.      . triamcinolone cream (KENALOG) 0.5 % Apply 1 application topically 3 (three) times daily.    Marland Kitchen amoxicillin-clavulanate (AUGMENTIN) 875-125 MG tablet Take 1 tablet by mouth 2 (two) times daily for 7 days. 14 tablet 0  . azithromycin 250 mg in dextrose 5 % 125 mL Place 1 drop into both eyes nightly.     No current facility-administered medications for this visit.    Allergies: No Known Allergies I reviewed his past medical history, social history, family history, and environmental history and no significant changes have been reported from previous visits.  Review of Systems  Constitutional: Negative for chills, diaphoresis, fatigue and fever.  HENT: Positive for sore throat. Negative for congestion, ear discharge, ear pain, facial swelling, postnasal drip, rhinorrhea, sinus pressure, sinus pain, trouble swallowing and voice change.   Eyes: Negative for pain, discharge and itching.  Respiratory: Negative for apnea, cough, chest  tightness and shortness of breath.   Cardiovascular: Negative for chest pain.  Gastrointestinal: Negative for diarrhea and nausea.  Musculoskeletal: Negative for arthralgias and myalgias.  Skin: Negative for rash.  Allergic/Immunologic: Positive for environmental allergies. Negative for food allergies.    Objective:  Physical exam not obtained as encounter was done via telephone.   Previous notes and tests were reviewed.  I discussed the assessment and treatment plan with the patient. The patient was provided an opportunity to ask questions and all were answered. The patient agreed with the plan and demonstrated an understanding of the instructions.   The patient was advised to call back or seek an in-person evaluation if the symptoms worsen or if the condition fails to improve as anticipated.  I provided 18 minutes of non-face-to-face time during this encounter.  It was my pleasure to participate in [place] Dziuba's care today. Please feel free to contact me with any questions or concerns.   Sincerely,  Valentina Shaggy, MD

## 2018-11-07 NOTE — Patient Instructions (Addendum)
1. Perennial and seasonal allergic rhinitis (dust mites, dog, cockroach, indoor and outdoor molds, trees, ragweed, and weeds) - Continue with all of your allergy medications.  - In the meantime, I recommend that you use the fluticasone EVERY DAY for the best effects. - Also start nasal ipratropium one spray per nostril EVERY MORNING to help dry out of your nose and prevent the postnasal drip. - Continue with cetirizine 10mg  EVERY DAY.   2. Acute pharyngitis - Start Augmentin 875mg  twice daily for one week. - Continue with the tessalon pearls. - Drink tea with honey to help the throat feel better. - If this is not working, give [place] a call and we may need to refer you to GI or ENT for further workup.  3. Return in about 1 year (around 11/07/2019). This can be an in-person, a virtual Webex or a telephone follow up visit.   Please inform us of any Emergency Department visits, hospitalizations, or changes in symptoms. Call us before going to the ED for breathing or allergy symptoms since we might be able to fit you in for a sick visit. Feel free to contact us anytime with any questions, problems, or concerns.  It was a pleasure to talk to you today today!  Websites that have reliable patient information: 1. American Academy of Asthma, Allergy, and Immunology: www.aaaai.org 2. Food Allergy Research and Education (FARE): foodallergy.org 3. Mothers of Asthmatics: http://www.asthmacommunitynetwork.org 4. American College of Allergy, Asthma, and Immunology: www.acaai.org  "Like" [place] on Facebook and Instagram for our latest updates!      Make sure you are registered to vote! If you have moved or changed any of your contact information, you will need to get this updated before voting!    Voter ID laws are NOT going into effect for the General Election in November 2020! DO NOT let this stop you from exercising your right to vote!

## 2018-11-07 NOTE — Telephone Encounter (Signed)
Lordy. Yes please add as a televisit.   Malachi Bonds, MD [facility]

## 2018-11-14 ENCOUNTER — Ambulatory Visit: Payer: PRIVATE HEALTH INSURANCE | Admitting: Allergy & Immunology

## 2018-11-21 ENCOUNTER — Telehealth: Payer: Self-pay | Admitting: Cardiology

## 2018-11-21 NOTE — Telephone Encounter (Signed)
Virtual Visit Pre-Appointment Phone Call  "(Name), I am calling you today to discuss your upcoming appointment. We are currently trying to limit exposure to the virus that causes COVID-19 by seeing patients at home rather than in the office."  1. "What is the BEST phone number to call the day of the visit?" - include this in appointment notes  2. Do you have or have access to (through a family member/friend) a smartphone with video capability that we can use for your visit?" a. If yes - list this number in appt notes as cell (if different from BEST phone #) and list the appointment type as a VIDEO visit in appointment notes b. If no - list the appointment type as a PHONE visit in appointment notes  3. Confirm consent - "In the setting of the current Covid19 crisis, you are scheduled for a (phone or video) visit with your provider on (date) at (time).  Just as we do with many in-office visits, in order for you to participate in this visit, we must obtain consent.  If you'd like, I can send this to your mychart (if signed up) or email for you to review.  Otherwise, I can obtain your verbal consent now.  All virtual visits are billed to your insurance company just like a normal visit would be.  By agreeing to a virtual visit, we'd like you to understand that the technology does not allow for your provider to perform an examination, and thus may limit your provider's ability to fully assess your condition. If your provider identifies any concerns that need to be evaluated in person, we will make arrangements to do so.  Finally, though the technology is pretty good, we cannot assure that it will always work on either your or our end, and in the setting of a video visit, we may have to convert it to a phone-only visit.  In either situation, we cannot ensure that we have a secure connection.  Are you willing to proceed?" STAFF: Did the patient verbally acknowledge consent to telehealth visit? Document  YES/NO here: Yes  4. Advise patient to be prepared - "Two hours prior to your appointment, go ahead and check your blood pressure, pulse, oxygen saturation, and your weight (if you have the equipment to check those) and write them all down. When your visit starts, your provider will ask you for this information. If you have an Apple Watch or Kardia device, please plan to have heart rate information ready on the day of your appointment. Please have a pen and paper handy nearby the day of the visit as well."  5. Give patient instructions for MyChart download to smartphone OR Doximity/Doxy.me as below if video visit (depending on what platform provider is using)  6. Inform patient they will receive a phone call 15 minutes prior to their appointment time (may be from unknown caller ID) so they should be prepared to answer    TELEPHONE CALL NOTE  Samuel Wilkerson has been deemed a candidate for a follow-up tele-health visit to limit community exposure during the Covid-19 pandemic. I spoke with the patient via phone to ensure availability of phone/video source, confirm preferred email & phone number, and discuss instructions and expectations.  I reminded Samuel Wilkerson to be prepared with any vital sign and/or heart rhythm information that could potentially be obtained via home monitoring, at the time of his visit. I reminded Samuel Wilkerson to expect a phone call prior to  his visit.  [name] 11/21/2018 4:05 PM   INSTRUCTIONS FOR DOWNLOADING THE MYCHART APP TO SMARTPHONE  - The patient must first make sure to have activated MyChart and know their login information - If Apple, go to [employer] and type in MyChart in the search bar and download the app. If Android, ask patient to go to [employer] and type in [place] in the search bar and download the app. The app is free but as with any other app downloads, their phone may require them to verify saved payment information or Apple/Android  password.  - The patient will need to then log into the app with their MyChart username and password, and select [facility] as their healthcare provider to link the account. When it is time for your visit, go to the MyChart app, find appointments, and click Begin Video Visit. Be sure to Select Allow for your device to access the Microphone and Camera for your visit. You will then be connected, and your provider will be with you shortly.  **If they have any issues connecting, or need assistance please contact MyChart Wil[facility] desk [phone])**  **If using a computer, in order to ensure the best quality for their visit they will need to use either of the following Internet Browsers: [employer] or Google Chrome**  IF USING DOXIMITY or DOXY.ME - The patient will receive a link just prior to their visit by text.     FULL LENGTH CONSENT FOR TELE-HEALTH VISIT   I hereby voluntarily request, consent and authorize [facility] and its employed or contracted physicians, physician assistants, nurse practitioners or other licensed health care professionals (the Practitioner), to provide me with telemedicine health care services (the Services") as deemed necessary by the treating Practitioner. I acknowledge and consent to receive the Services by the Practitioner via telemedicine. I understand that the telemedicine visit will involve communicating with the Practitioner through live audiovisual communication technology and the disclosure of certain medical information by electronic transmission. I acknowledge that I have been given the opportunity to request an in-person assessment or other available alternative prior to the telemedicine visit and am voluntarily participating in the telemedicine visit.  I understand that I have the right to withhold or withdraw my consent to the use of telemedicine in the course of my care at any time, without affecting my right to future care or treatment,  and that the Practitioner or I may terminate the telemedicine visit at any time. I understand that I have the right to inspect all information obtained and/or recorded in the course of the telemedicine visit and may receive copies of available information for a reasonable fee.  I understand that some of the potential risks of receiving the Services via telemedicine include:   Delay or interruption in medical evaluation due to technological equipment failure or disruption;  Information transmitted may not be sufficient (e.g. poor resolution of images) to allow for appropriate medical decision making by the Practitioner; and/or   In rare instances, security protocols could fail, causing a breach of personal health information.  Furthermore, I acknowledge that it is my responsibility to provide information about my medical history, conditions and care that is complete and accurate to the best of my ability. I acknowledge that Practitioner's advice, recommendations, and/or decision may be based on factors not within their control, such as incomplete or inaccurate data provided by me or distortions of diagnostic images or specimens that may result from electronic transmissions. I  understand that the practice of medicine is not an exact science and that Practitioner makes no warranties or guarantees regarding treatment outcomes. I acknowledge that I will receive a copy of this consent concurrently upon execution via email to the email address I last provided but may also request a printed copy by calling the office of [place].    I understand that my insurance will be billed for this visit.   I have read or had this consent read to me.  I understand the contents of this consent, which adequately explains the benefits and risks of the Services being provided via telemedicine.   I have been provided ample opportunity to ask questions regarding this consent and the Services and have had my questions  answered to my satisfaction.  I give my informed consent for the services to be provided through the use of telemedicine in my medical care  By participating in this telemedicine visit I agree to the above.

## 2018-11-26 ENCOUNTER — Other Ambulatory Visit: Payer: Self-pay

## 2018-11-26 ENCOUNTER — Telehealth (INDEPENDENT_AMBULATORY_CARE_PROVIDER_SITE_OTHER): Payer: Medicare Other | Admitting: Cardiology

## 2018-11-26 ENCOUNTER — Encounter: Payer: Self-pay | Admitting: Cardiology

## 2018-11-26 ENCOUNTER — Encounter: Payer: Self-pay | Admitting: *Deleted

## 2018-11-26 VITALS — BP 123/81 | HR 62 | Ht 76.0 in | Wt 205.0 lb

## 2018-11-26 DIAGNOSIS — E782 Mixed hyperlipidemia: Secondary | ICD-10-CM

## 2018-11-26 DIAGNOSIS — I2510 Atherosclerotic heart disease of native coronary artery without angina pectoris: Secondary | ICD-10-CM | POA: Insufficient documentation

## 2018-11-26 DIAGNOSIS — E785 Hyperlipidemia, unspecified: Secondary | ICD-10-CM | POA: Insufficient documentation

## 2018-11-26 DIAGNOSIS — I25118 Atherosclerotic heart disease of native coronary artery with other forms of angina pectoris: Secondary | ICD-10-CM

## 2018-11-26 DIAGNOSIS — I10 Essential (primary) hypertension: Secondary | ICD-10-CM | POA: Insufficient documentation

## 2018-11-26 NOTE — Patient Instructions (Addendum)
Medication Instructions:  Your physician recommends that you continue on your current medications as directed. Please refer to the Current Medication list given to you today.  If you need a refill on your cardiac medications before your next appointment, please call your pharmacy.   Lab work: None  If you have labs (blood work) drawn today and your tests are completely normal, you will receive your results only by: Marland Kitchen MyChart Message (if you have MyChart) OR . A paper copy in the mail If you have any lab test that is abnormal or we need to change your treatment, we will call you to review the results.  Testing/Procedures: None  Follow-Up: At [facility], you and your health needs are our priority.  As part of our continuing mission to provide you with exceptional heart care, we have created designated Provider Care Teams.  These Care Teams include your primary Cardiologist (physician) and Advanced Practice Providers (APPs -  Physician Assistants and Nurse Practitioners) who all work together to provide you with the care you need, when you need it. You will need a follow up appointment in 6 months: Monday, 06/03/2019, at 11:20 am in the [facility] office.

## 2018-11-26 NOTE — Progress Notes (Signed)
Virtual Visit via Telephone Note   This visit type was conducted due to national recommendations for restrictions regarding the COVID-19 Pandemic (e.g. social distancing) in an effort to limit this patient's exposure and mitigate transmission in our community.  Due to his co-morbid illnesses, this patient is at least at moderate risk for complications without adequate follow up.  This format is felt to be most appropriate for this patient at this time.  The patient did not have access to video technology/had technical difficulties with video requiring transitioning to audio format only (telephone).  All issues noted in this document were discussed and addressed.  No physical exam could be performed with this format.  Please refer to the patient's chart for his  consent to telehealth [facility].   Date:  11/26/2018   ID:  Samuel Wilkerson, DOB 01/11/1931, MRN [id number]  Patient Location: Home Provider Location: Office  PCP:  Gareth MorganKnowlton, Steve, MD  Cardiologist:  No primary care provider on file. Dr Dulce SellarMunley Electrophysiologist:  None   Evaluation Performed:  Follow-Up Visit  Chief Complaint:  CAD  History of Present Illness:    Samuel ParsonsCharlie W Wilkerson is a 85 y.o. male with CAD remote myocardial infarction in 1993 is a notation has had previous PCI of the diagonal at that time in 1993 follow-up heart catheterization in 2006 and EF of 68% occluded right coronary artery and 90% stenosis in the first diagonal branch treated medically ejection fraction 68% hypertension and hyper lipidemia.  His last lipid profile 12/21/2016 had a cholesterol 141 LDL of 80 HDL 35  The patient does not have symptoms concerning for COVID-19 infection (fever, chills, cough, or new shortness of breath).   [facility] tells me labs are done that his cholesterol is good but does not have a copy.  He rechecked his blood pressure that is at target.  He is pleased with the quality of his life has  had no angina dyspnea palpitation or syncope. Past Medical History:  Diagnosis Date  . Coronary artery disease   . Hyperlipidemia   . Hypertension    Past Surgical History:  Procedure Laterality Date  . CARDIAC CATHETERIZATION    . CORONARY ANGIOPLASTY    . TONSILLECTOMY       No outpatient medications have been marked as taking for the 11/26/18 encounter (Appointment) with Baldo DaubMunley, Harlene Petralia J, MD.     Allergies:   Patient has no known allergies.   Social History   Tobacco Use  . Smoking status: Never Smoker  . Smokeless tobacco: Never Used  Substance Use Topics  . Alcohol use: No    Alcohol/week: 0.0 standard drinks  . Drug use: No     Family Hx: The patient's family history includes Diabetes in his sister.  ROS:   Please see the history of present illness.     All other systems reviewed and are negative.   Prior CV studies:   The following studies were reviewed today:    Labs/Other Tests and Data Reviewed:    EKG:  No ECG reviewed.  Recent Labs: No results found for requested labs within last 8760 hours.   Recent Lipid Panel No results found for: CHOL, TRIG, HDL, CHOLHDL, LDLCALC, LDLDIRECT  Wt Readings from Last 3 Encounters:  08/15/18 204 lb 9.6 oz (92.8 kg)  10/04/17 205 lb (93 kg)  09/03/16 205 lb (93 kg)     Objective:    Vital Signs:  There were no  vitals taken for this visit.   VITAL SIGNS:  reviewed his mood affect thought cognition normal alert oriented x3 in no respiratory distress or audible wheezing during conversation  ASSESSMENT & PLAN:    1. CAD, there is a difficult visit he has no ability for video conferencing his coronary disease is asymptomatic New York Heart Association class I and continue medical therapy including aspirin calcium channel beta-blocker and high intensity statin.  I asked him to hand carry lipid profile to his next visit 2. Hypertension stable continue current treatment including ACE inhibitor with CAD 3.  Hyperlipidemia stable continue high intensity statin he assures me his LDL is at target and follow-up with [facility] care  COVID-19 Education: The signs and symptoms of COVID-19 were discussed with the patient and how to seek care for testing (follow up with PCP or arrange E-visit).  The importance of social distancing was discussed today.  Time:   Today, I have spent 20 minutes with the patient with telehealth technology discussing the above problems.     Medication Adjustments/Labs and Tests Ordered: Current medicines are reviewed at length with the patient today.  Concerns regarding medicines are outlined above.   Tests Ordered: No orders of the defined types were placed in this encounter.   Medication Changes: No orders of the defined types were placed in this encounter.   Disposition:  Follow up in 6 month(s)  Signed, Norman Herrlich, MD  11/26/2018 10:18 AM    [facility]

## 2018-12-04 ENCOUNTER — Ambulatory Visit: Payer: PRIVATE HEALTH INSURANCE | Admitting: Podiatry

## 2018-12-06 ENCOUNTER — Ambulatory Visit: Payer: PRIVATE HEALTH INSURANCE | Admitting: Podiatry

## 2018-12-07 ENCOUNTER — Encounter: Payer: Self-pay | Admitting: Podiatry

## 2018-12-07 ENCOUNTER — Ambulatory Visit (INDEPENDENT_AMBULATORY_CARE_PROVIDER_SITE_OTHER): Payer: PRIVATE HEALTH INSURANCE | Admitting: Podiatry

## 2018-12-07 ENCOUNTER — Other Ambulatory Visit: Payer: Self-pay

## 2018-12-07 DIAGNOSIS — M79675 Pain in left toe(s): Secondary | ICD-10-CM | POA: Diagnosis not present

## 2018-12-07 DIAGNOSIS — B351 Tinea unguium: Secondary | ICD-10-CM | POA: Diagnosis not present

## 2018-12-07 DIAGNOSIS — L84 Corns and callosities: Secondary | ICD-10-CM

## 2018-12-07 DIAGNOSIS — M79674 Pain in right toe(s): Secondary | ICD-10-CM | POA: Diagnosis not present

## 2018-12-07 NOTE — Patient Instructions (Signed)

## 2018-12-09 ENCOUNTER — Encounter: Payer: Self-pay | Admitting: Podiatry

## 2018-12-09 NOTE — Progress Notes (Signed)
Subjective:  Samuel Wilkerson presents to clinic today with cc of  painful, thick, discolored, elongated toenails 1-5 b/l that become tender and cannot cut because of thickness. Pain is aggravated when wearing enclosed shoe gear.  [name], MD is her PCP.    Current Outpatient Medications:  .  albuterol (PROVENTIL HFA;VENTOLIN HFA) 108 (90 Base) MCG/ACT inhaler, Inhale 1-2 puffs into the lungs every 6 (six) hours as needed for wheezing or shortness of breath., Disp: 1 Inhaler, Rfl: 0 .  amLODipine (NORVASC) 10 MG tablet, Take 5 mg by mouth daily. , Disp: , Rfl:  .  amoxicillin (AMOXIL) 500 MG tablet, Take 500 mg by mouth 3 (three) times daily., Disp: , Rfl:  .  aspirin EC 81 MG tablet, Take 81 mg by mouth daily. , Disp: , Rfl:  .  azithromycin 250 mg in dextrose 5 % 125 mL, Place 1 drop into both eyes nightly., Disp: , Rfl:  .  cetirizine (ZYRTEC) 10 MG tablet, Take 10 mg by mouth daily., Disp: , Rfl:  .  chlordiazePOXIDE (LIBRIUM) 10 MG capsule, Take 20 mg by mouth at bedtime., Disp: , Rfl:  .  Cholecalciferol (VITAMIN D3) 25 MCG (1000 UT) CAPS, Take 1,000 Units by mouth daily. , Disp: , Rfl:  .  dorzolamide (TRUSOPT) 2 % ophthalmic solution, Place 1 drop into both eyes 2 (two) times daily., Disp: , Rfl:  .  fluticasone (FLONASE) 50 MCG/ACT nasal spray, Place 1 spray into both nostrils daily., Disp: 16 g, Rfl: 2 .  guaiFENesin 200 MG tablet, Take 200 mg by mouth 2 (two) times a day., Disp: , Rfl:  .  hydrochlorothiazide (HYDRODIURIL) 50 MG tablet, Take 50 mg by mouth daily. , Disp: , Rfl:  .  hydrocortisone-pramoxine (PROCTOFOAM-HC) rectal foam, Place 1 applicator rectally as needed for hemorrhoids or itching., Disp: , Rfl:  .  ipratropium (ATROVENT) 0.03 % nasal spray, Place 1 spray into both nostrils every morning., Disp: 30 mL, Rfl: 2 .  lisinopril (PRINIVIL,ZESTRIL) 20 MG tablet, Take 10 mg by mouth daily. , Disp: , Rfl:  .  metoprolol (TOPROL-XL) 50 MG 24 hr tablet, Take 50 mg by  mouth 2 (two) times a day. , Disp: , Rfl:  .  Miconazole Nitrate 2 % KIT, Apply topically., Disp: , Rfl:  .  montelukast (SINGULAIR) 10 MG tablet, Take 10 mg by mouth at bedtime., Disp: , Rfl:  .  naproxen (NAPROSYN) 375 MG tablet, Take 375 mg by mouth 2 (two) times daily with a meal., Disp: , Rfl:  .  nitroGLYCERIN (NITROSTAT) 0.4 MG SL tablet, Place 0.4 mg under the tongue every 5 (five) minutes as needed. For chest pain , Disp: , Rfl:  .  omeprazole (PRILOSEC) 20 MG capsule, Take 20 mg by mouth 2 (two) times daily before a meal., Disp: , Rfl:  .  polyethylene glycol (MIRALAX / GLYCOLAX) packet, Take 17 g by mouth daily.  , Disp: , Rfl:  .  potassium chloride (K-DUR) 10 MEQ tablet, Take 10 mEq by mouth daily., Disp: , Rfl:  .  rosuvastatin (CRESTOR) 40 MG tablet, Take 40 mg by mouth daily. , Disp: , Rfl:  .  terbinafine (LAMISIL) 1 % cream, Apply topically daily. , Disp: , Rfl:  .  triamcinolone cream (KENALOG) 0.5 %, Apply 1 application topically as needed. , Disp: , Rfl:    No Known Allergies   Objective: There were no vitals filed for this visit.  Physical Examination:  Vascular Examination: Capillary  refill time <3 seconds x 10 digits.  DP/PT pulses are faintly palpable b/l.  Digital hair absent b/l.  No edema noted b/l.  Skin temperature gradient warm to cool b/l.  Dermatological Examination: Skin with mild atrophy b/l.  No open wounds b/l.  No interdigital macerations noted b/l.  Elongated, thick, discolored brittle toenails with subungual debris and pain on dorsal palpation of nailbeds 1-5 b/l.  Hyperkeratotic lesion noted dorsal 5th digit PIPJ with tenderness to palpation. No edema, no erythema, no drainage, no flocculence.  Musculoskeletal Examination: Muscle strength 5/5 to all muscle groups b/l  Hammertoes 2-5 b/l.  No pain, crepitus or joint discomfort with active/passive ROM.  Neurological Examination: Sensation intact 5/5 b/l with 10 gram  monofilament.  Vibratory sensation intact b/l.  Proprioceptive sensation intact b/l.  Assessment: Mycotic nail infection with pain 1-5 b/l Corn 5th digit Pain in toes  Plan: 1. Toenails 1-5 b/l were debrided in length and girth without iatrogenic laceration. 2. Corn(s) pared  5th digits utilizing sterile scalpel blade without incident. 3. Continue soft, supportive shoe gear daily. 4. Report any pedal injuries to medical professional. 5. Follow up 3 months. 5.  Patient/POA to call should there be a question/concern in there interim.

## 2019-03-15 ENCOUNTER — Encounter: Payer: Self-pay | Admitting: Podiatry

## 2019-03-15 ENCOUNTER — Ambulatory Visit (INDEPENDENT_AMBULATORY_CARE_PROVIDER_SITE_OTHER): Payer: Medicare Other | Admitting: Podiatry

## 2019-03-15 ENCOUNTER — Other Ambulatory Visit: Payer: Self-pay

## 2019-03-15 DIAGNOSIS — L84 Corns and callosities: Secondary | ICD-10-CM

## 2019-03-15 DIAGNOSIS — B351 Tinea unguium: Secondary | ICD-10-CM | POA: Diagnosis not present

## 2019-03-15 DIAGNOSIS — M79674 Pain in right toe(s): Secondary | ICD-10-CM

## 2019-03-15 DIAGNOSIS — M79675 Pain in left toe(s): Secondary | ICD-10-CM | POA: Diagnosis not present

## 2019-03-15 NOTE — Patient Instructions (Signed)

## 2019-03-20 NOTE — Progress Notes (Signed)
Subjective:  Samuel Wilkerson presents to clinic today with cc of  painful, thick, discolored, elongated toenails 1-5 b/l that become tender and cannot cut because of thickness.Pain is aggravated when wearing enclosed shoe gear.   Current Outpatient Medications:  .  albuterol (PROVENTIL HFA;VENTOLIN HFA) 108 (90 Base) MCG/ACT inhaler, Inhale 1-2 puffs into the lungs every 6 (six) hours as needed for wheezing or shortness of breath., Disp: 1 Inhaler, Rfl: 0 .  amLODipine (NORVASC) 10 MG tablet, Take 5 mg by mouth daily. , Disp: , Rfl:  .  amoxicillin (AMOXIL) 500 MG tablet, Take 500 mg by mouth 3 (three) times daily., Disp: , Rfl:  .  aspirin EC 81 MG tablet, Take 81 mg by mouth daily. , Disp: , Rfl:  .  azithromycin 250 mg in dextrose 5 % 125 mL, Place 1 drop into both eyes nightly., Disp: , Rfl:  .  cetirizine (ZYRTEC) 10 MG tablet, Take 10 mg by mouth daily., Disp: , Rfl:  .  chlordiazePOXIDE (LIBRIUM) 10 MG capsule, Take 20 mg by mouth at bedtime., Disp: , Rfl:  .  Cholecalciferol (VITAMIN D3) 25 MCG (1000 UT) CAPS, Take 1,000 Units by mouth daily. , Disp: , Rfl:  .  dorzolamide (TRUSOPT) 2 % ophthalmic solution, Place 1 drop into both eyes 2 (two) times daily., Disp: , Rfl:  .  fluticasone (FLONASE) 50 MCG/ACT nasal spray, Place 1 spray into both nostrils daily., Disp: 16 g, Rfl: 2 .  guaiFENesin 200 MG tablet, Take 200 mg by mouth 2 (two) times a day., Disp: , Rfl:  .  hydrochlorothiazide (HYDRODIURIL) 50 MG tablet, Take 50 mg by mouth daily. , Disp: , Rfl:  .  hydrocortisone-pramoxine (PROCTOFOAM-HC) rectal foam, Place 1 applicator rectally as needed for hemorrhoids or itching., Disp: , Rfl:  .  ipratropium (ATROVENT) 0.03 % nasal spray, Place 1 spray into both nostrils every morning., Disp: 30 mL, Rfl: 2 .  lisinopril (PRINIVIL,ZESTRIL) 20 MG tablet, Take 10 mg by mouth daily. , Disp: , Rfl:  .  metoprolol (TOPROL-XL) 50 MG 24 hr tablet, Take 50 mg by mouth 2 (two) times a day. , Disp: ,  Rfl:  .  Miconazole Nitrate 2 % KIT, Apply topically., Disp: , Rfl:  .  montelukast (SINGULAIR) 10 MG tablet, Take 10 mg by mouth at bedtime., Disp: , Rfl:  .  naproxen (NAPROSYN) 375 MG tablet, Take 375 mg by mouth 2 (two) times daily with a meal., Disp: , Rfl:  .  nitroGLYCERIN (NITROSTAT) 0.4 MG SL tablet, Place 0.4 mg under the tongue every 5 (five) minutes as needed. For chest pain , Disp: , Rfl:  .  omeprazole (PRILOSEC) 20 MG capsule, Take 20 mg by mouth 2 (two) times daily before a meal., Disp: , Rfl:  .  polyethylene glycol (MIRALAX / GLYCOLAX) packet, Take 17 g by mouth daily.  , Disp: , Rfl:  .  potassium chloride (K-DUR) 10 MEQ tablet, Take 10 mEq by mouth daily., Disp: , Rfl:  .  rosuvastatin (CRESTOR) 40 MG tablet, Take 40 mg by mouth daily. , Disp: , Rfl:  .  terbinafine (LAMISIL) 1 % cream, Apply topically daily. , Disp: , Rfl:  .  triamcinolone cream (KENALOG) 0.5 %, Apply 1 application topically as needed. , Disp: , Rfl:    No Known Allergies   Objective: Physical Examination:  Vascular Examination: Capillary refill time <3seconds x 10 digits.  Faintly palpable DP/PT pulses b/l.  Digital hair absent b/l.  No edema noted b/l.  Skin temperature gradient warm to cool.  Dermatological Examination: Pedal skin thin and atrophic b/l.  No open wounds b/l.  No interdigital macerations noted b/l.  Elongated, thick, discolored brittle toenails with subungual debris and pain on dorsal palpation of nailbeds 1-5 b/l.  Hyperkeratotic lesion dorsal 5th digit PIPJ right 5th digit. No erythema, no edema, no drainage, no flocculence noted.  Musculoskeletal Examination: Muscle strength 5/5 to all muscle groups b/l.  Hammertoes 2-5 b/l.  No pain, crepitus or joint discomfort with active/passive ROM.  Neurological Examination: Sensation intact 5/5 b/l with 10 gram monofilament.  Vibratory sensation intact b/l.  Proprioceptive sensation intact  b/l.  Assessment: Mycotic nail infection with pain 1-5 b/l Corn right 5th digit Pain in toes right foot  Plan: 1. Toenails 1-5 b/l were debrided in length and girth without iatrogenic laceration. 2. Corn right 5th digit pared utilizing sterile scalpel blade without incident. 3. Continue soft, supportive shoe gear daily. 4. Report any pedal injuries to medical professional. 5. Follow up 3 months. 6. Patient/POA to call should there be a question/concern in there interim.

## 2019-06-03 ENCOUNTER — Ambulatory Visit: Payer: Medicare Other | Admitting: Cardiology

## 2019-06-03 ENCOUNTER — Telehealth: Payer: Self-pay | Admitting: Cardiology

## 2019-06-03 NOTE — Telephone Encounter (Signed)
New Message:  Per Staff Message from Eliezer Champagne 05/31/19   "Patient was seen by Dr. Bettina Gavia in [facility] but cancelled his f/up due to it being too far for him to drive. He would like to be seen by a doctor at [address]. Can this be done please? "

## 2019-06-03 NOTE — Telephone Encounter (Signed)
Is like a good idea to see a cardiologist closer to him

## 2019-06-03 NOTE — Telephone Encounter (Signed)
I will send message to Dr. Bettina Gavia for confirmation ok to change to providers and location. I will ask Dr. Bettina Gavia for a recommendation as to who he would pt to see in [place].

## 2019-06-19 ENCOUNTER — Ambulatory Visit (INDEPENDENT_AMBULATORY_CARE_PROVIDER_SITE_OTHER): Payer: PRIVATE HEALTH INSURANCE | Admitting: Allergy & Immunology

## 2019-06-19 ENCOUNTER — Encounter: Payer: Self-pay | Admitting: Allergy & Immunology

## 2019-06-19 ENCOUNTER — Other Ambulatory Visit: Payer: Self-pay

## 2019-06-19 VITALS — BP 156/88 | HR 65 | Temp 97.6°F | Resp 18 | Ht 76.0 in | Wt 207.0 lb

## 2019-06-19 DIAGNOSIS — J3089 Other allergic rhinitis: Secondary | ICD-10-CM

## 2019-06-19 DIAGNOSIS — J312 Chronic pharyngitis: Secondary | ICD-10-CM | POA: Diagnosis not present

## 2019-06-19 DIAGNOSIS — J302 Other seasonal allergic rhinitis: Secondary | ICD-10-CM

## 2019-06-19 DIAGNOSIS — K219 Gastro-esophageal reflux disease without esophagitis: Secondary | ICD-10-CM | POA: Diagnosis not present

## 2019-06-19 MED ORDER — AMOXICILLIN-POT CLAVULANATE 875-125 MG PO TABS: 1.0000 | ORAL_TABLET | Freq: Two times a day (BID) | ORAL | 0 refills | Status: AC

## 2019-06-19 MED ORDER — PANTOPRAZOLE SODIUM 40 MG PO TBEC: 40.0000 mg | DELAYED_RELEASE_TABLET | Freq: Every day | ORAL | 1 refills | Status: DC

## 2019-06-19 NOTE — Patient Instructions (Addendum)
1. Perennial and seasonal allergic rhinitis (dust mites, dog, cockroach, indoor and outdoor molds, trees, ragweed, and weeds) - Continue with all of your allergy medications.  - In the meantime, I recommend that you use the fluticasone EVERY DAY for the best effects. - Also start nasal ipratropium one spray per nostril EVERY MORNING to help dry out of your nose and prevent the postnasal drip. - Continue with cetirizine 10mg  EVERY DAY.   - Could consider allergy shots for more long term control.   2. Acute pharyngitis - Start Augmentin 875mg  twice daily for one week. - Drink tea with honey to help the throat feel better. - We are going to refer you to ENT for an evaluation of your throat.  3. GERD - Stop the omeprazole and start pantoprazole 40mg  daily.   4. Return in about 6 months (around 12/18/2019). This can be an in-person, a virtual Webex or a telephone follow up visit.   Please inform of any Emergency Department visits, hospitalizations, or changes in symptoms. Call before going to the ED for breathing or allergy symptoms since we might be able to fit you in for a sick visit. Feel free to contact 02/17/2020 anytime with any questions, problems, or concerns.  It was a pleasure to talk to you today today!  Websites that have reliable patient information: 1. American Academy of Asthma, Allergy, and Immunology: www.aaaai.org 2. Food Allergy Research and Education (FARE): foodallergy.org 3. Mothers of Asthmatics: http://www.asthmacommunitynetwork.org 4. American College of Allergy, Asthma, and Immunology: www.acaai.org  "Like" [place] on Facebook and Instagram for our latest updates!      Make sure you are registered to vote! If you have moved or changed any of your contact information, you will need to get this updated before voting!       Allergy Shots   Allergies are the result of a chain reaction that starts in the immune system. Your immune system controls how your body defends  itself. For instance, if you have an allergy to pollen, your immune system identifies pollen as an invader or allergen. Your immune system overreacts by producing antibodies called Immunoglobulin E (IgE). These antibodies travel to cells that release chemicals, causing an allergic reaction.  The concept behind allergy immunotherapy, whether it is received in the form of shots or tablets, is that the immune system can be desensitized to specific allergens that trigger allergy symptoms. Although it requires time and patience, the payback can be long-term relief.  How Do Allergy Shots Work?  Allergy shots work much like a vaccine. Your body responds to injected amounts of a particular allergen given in increasing doses, eventually developing a resistance and tolerance to it. Allergy shots can lead to decreased, minimal or no allergy symptoms.  There generally are two phases: build-up and maintenance. Build-up often ranges from three to six months and involves receiving injections with increasing amounts of the allergens. The shots are typically given once or twice a week, though more rapid build-up schedules are sometimes used.  The maintenance phase begins when the most effective dose is reached. This dose is different for each person, depending on how allergic you are and your response to the build-up injections. Once the maintenance dose is reached, there are longer periods between injections, typically two to four weeks.  Occasionally doctors give cortisone-type shots that can temporarily reduce allergy symptoms. These types of shots are different and should not be confused with allergy immunotherapy shots.  Who Can Be Treated with  Allergy Shots?  Allergy shots may be a good treatment approach for people with allergic rhinitis (hay fever), allergic asthma, conjunctivitis (eye allergy) or stinging insect allergy.   Before deciding to begin allergy shots, you should consider:  . The length of  allergy season and the severity of your symptoms . Whether medications and/or changes to your environment can control your symptoms . Your desire to avoid long-term medication use . Time: allergy immunotherapy requires a major time commitment . Cost: may vary depending on your insurance coverage  Allergy shots for children age 77 and older are effective and often well tolerated. They might prevent the onset of new allergen sensitivities or the progression to asthma.  Allergy shots are not started on patients who are pregnant but can be continued on patients who become pregnant while receiving them. In some patients with other medical conditions or who take certain common medications, allergy shots may be of risk. It is important to mention other medications you talk to your allergist.   When Will I Feel Better?  Some may experience decreased allergy symptoms during the build-up phase. For others, it may take as long as 12 months on the maintenance dose. If there is no improvement after a year of maintenance, your allergist will discuss other treatment options with you.  If you aren't responding to allergy shots, it may be because there is not enough dose of the allergen in your vaccine or there are missing allergens that were not identified during your allergy testing. Other reasons could be that there are high levels of the allergen in your environment or major exposure to non-allergic triggers like tobacco smoke.  What Is the Length of Treatment?  Once the maintenance dose is reached, allergy shots are generally continued for three to five years. The decision to stop should be discussed with your allergist at that time. Some people may experience a permanent reduction of allergy symptoms. Others may relapse and a longer course of allergy shots can be considered.  What Are the Possible Reactions?  The two types of adverse reactions that can occur with allergy shots are local and systemic.  Common local reactions include very mild redness and swelling at the injection site, which can happen immediately or several hours after. A systemic reaction, which is less common, affects the entire body or a particular body system. They are usually mild and typically respond quickly to medications. Signs include increased allergy symptoms such as sneezing, a stuffy nose or hives.  Rarely, a serious systemic reaction called anaphylaxis can develop. Symptoms include swelling in the throat, wheezing, a feeling of tightness in the chest, nausea or dizziness. Most serious systemic reactions develop within 30 minutes of allergy shots. This is why it is strongly recommended you wait in your doctor's office for 30 minutes after your injections. Your allergist is trained to watch for reactions, and his or her staff is trained and equipped with the proper medications to identify and treat them.  Who Should Administer Allergy Shots?  The preferred location for receiving shots is your prescribing allergist's office. Injections can sometimes be given at another facility where the physician and staff are trained to recognize and treat reactions, and have received instructions by your prescribing allergist.

## 2019-06-19 NOTE — Progress Notes (Signed)
FOLLOW UP  Date of Service/Encounter:  06/19/19   Assessment:   Recurrent pharyngitis - ? GERD  Perennial and seasonal allergic rhinitis (dust mites, dog, cockroach, indoor and outdoor molds, trees, ragweed, and weeds)   Samuel Wilkerson presents for a sick visit due to pharyngitis.  I saw him for similar complaint in April 2020.  He does report that the antibiotic helped in the spring, although his history is rather vague.  He has completed a course of amoxicillin, so we will increase to Augmentin to see if this provides any relief.  On exam, he does not have much in the way of erythema in his throat.  However, the Augmentin will certainly rule out a bacterial infection as a cause.  I am going to increase his PPI to pantoprazole from omeprazole.  It is unclear whether he is actually taking omeprazole.  He does not have a firm grasp of his medications at all.  We did call his niece, whom he lives with, to confirm who gives him medications.  Unfortunately, she was not very helpful at all.  We are going to refer him to otolaryngology so that they can take a look down his throat to see if we are missing anything or if they can see evidence of something such as laryngopharyngeal reflux.   Regarding his allergic rhinitis, we are treating this aggressively with nasal sprays and an antihistamine.  I did briefly discuss allergen immunotherapy, but I am hesitant to put an 85 year old on allergy shots.   Plan/Recommendations:   1. Perennial and seasonal allergic rhinitis (dust mites, dog, cockroach, indoor and outdoor molds, trees, ragweed, and weeds) - Continue with all of your allergy medications.  - In the meantime, I recommend that you use the fluticasone EVERY DAY for the best effects. - Also start nasal ipratropium one spray per nostril EVERY MORNING to help dry out of your nose and prevent the postnasal drip. - Continue with cetirizine 10mg  EVERY DAY.   - Could consider allergy shots for more long  term control.   2. Acute pharyngitis - Start Augmentin 875mg  twice daily for one week. - Drink tea with honey to help the throat feel better. - We are going to refer you to ENT for an evaluation of your throat.  3. GERD - Stop the omeprazole and start pantoprazole 40mg  daily.   4.  Follow-up in 6 months or earlier if needed. This can be an in-person, a virtual Webex or a telephone follow up visit.   Subjective:   Samuel Wilkerson is a 85 y.o. male presenting today for follow up of  Chief Complaint  Patient presents with  . Sore Throat    2 weeks, has seen his PCP was given antibiotics    Samuel Wilkerson has a history of the following: Patient Active Problem List   Diagnosis Date Noted  . Hyperlipidemia 11/26/2018  . CAD (coronary artery disease), native coronary artery 11/26/2018  . Hypertension 11/26/2018  . Seasonal and perennial allergic rhinitis 11/07/2018  . 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  Samuel Wilkerson is a 85 y.o. male presenting for a sick visit.  He has a history of perennial and seasonal allergic rhinitis.  I last saw him in April 2020 via telephone visit.  At that time, we treated him for pharyngitis twice daily for 1 week and we continue with Tessalon Perles.  I recommended drinking tea with honey to help with improving  the sensation in his throat.  We continued Flonase every day and added nasal ipratropium every morning.  We also continue with cetirizine 10 mg daily.  He presents today for sick visit for a sore throat. He talked to his PCP on November 27th. His PCP was recently given a course of amoxicillin without improvement. He did have some improvement but it did not knock it out completely.   He has seen an ENT in the past and according to the patient, there was nothing seen. There is no ENT in our system.  He does not seem to remember anyone putting any kind of video device down his throat.  He does report  that he had some swabbing done, but it is unclear when this was performed.  He does have omeprazole listed on his medication list, although he does not seem to realize he is supposed to be taking this. He goes to [facility] but his PCP is in [place], [place]. when asked him about reflux, he tells me he is on anxiety medicine.  Regarding his allergic rhinitis, he remains on his nose sprays.  It is unclear who organizes his medications, as he cannot even name any of them. His last testing was performed in 2020 and was positive to dust mites, dog, cockroach, indoor and outdoor molds, trees, ragweed, and weeds.  He has never been on allergen immunotherapy.  Otherwise, there have been no changes to his past medical history, surgical history, family history, or social history.    Review of Systems  Constitutional: Negative.  Negative for chills, fever, malaise/fatigue and weight loss.  HENT: Positive for sore throat. Negative for congestion, ear discharge and ear pain.        Positive for postnasal drip.  Eyes: Negative for pain, discharge and redness.  Respiratory: Negative for cough, sputum production, shortness of breath and wheezing.   Cardiovascular: Negative.  Negative for chest pain and palpitations.  Gastrointestinal: Negative for abdominal pain, constipation, diarrhea, heartburn, nausea and vomiting.  Skin: Negative.  Negative for itching and rash.  Neurological: Negative for dizziness and headaches.  Endo/Heme/Allergies: Negative for environmental allergies. Does not bruise/bleed easily.       Objective:   Blood pressure (!) 168/80, pulse 65, temperature 97.6 F (36.4 C), temperature source Temporal, resp. rate 18, height 6\' 4"  (1.93 m), weight 207 lb (93.9 kg), SpO2 95 %. Body mass index is 25.2 kg/m.   Physical Exam:  Physical Exam  Constitutional: He appears well-developed.  HENT:  Head: Normocephalic and atraumatic.  Right Ear: Tympanic membrane, external ear and ear canal  normal.  Left Ear: Tympanic membrane, external ear and ear canal normal.  Nose: No mucosal edema, rhinorrhea, nasal deformity or septal deviation. No epistaxis. Right sinus exhibits no maxillary sinus tenderness and no frontal sinus tenderness. Left sinus exhibits no maxillary sinus tenderness and no frontal sinus tenderness.  Mouth/Throat: Uvula is midline and oropharynx is clear and moist. Mucous membranes are not pale and not dry.  His throat actually looks fairly good.  There is some cobblestoning.  Tonsils are unremarkable without exudates.  There is no erythema appreciated.  Eyes: Pupils are equal, round, and reactive to light. Conjunctivae and EOM are normal. Right eye exhibits no chemosis and no discharge. Left eye exhibits no chemosis and no discharge. Right conjunctiva is not injected. Left conjunctiva is not injected.  Cardiovascular: Normal rate, regular rhythm and normal heart sounds.  Respiratory: Effort normal and breath sounds normal. No accessory muscle usage. No  tachypnea. No respiratory distress. He has no wheezes. He has no rhonchi. He has no rales. He exhibits no tenderness.  Moving air well in all lung fields.  No increased work of breathing.  Lymphadenopathy:    He has no cervical adenopathy.  Neurological: He is alert.  Skin: No abrasion, no petechiae and no rash noted. Rash is not papular, not vesicular and not urticarial. No erythema. No pallor.  Psychiatric: He has a normal mood and affect.     Diagnostic studies: none      Malachi BondsJoel Shanequia Kendrick, MD  [facility] [place]

## 2019-06-24 ENCOUNTER — Encounter: Payer: Self-pay | Admitting: Podiatry

## 2019-06-24 ENCOUNTER — Other Ambulatory Visit: Payer: Self-pay

## 2019-06-24 ENCOUNTER — Ambulatory Visit (INDEPENDENT_AMBULATORY_CARE_PROVIDER_SITE_OTHER): Payer: Medicare Other | Admitting: Podiatry

## 2019-06-24 DIAGNOSIS — L84 Corns and callosities: Secondary | ICD-10-CM | POA: Diagnosis not present

## 2019-06-24 DIAGNOSIS — M79675 Pain in left toe(s): Secondary | ICD-10-CM | POA: Diagnosis not present

## 2019-06-24 DIAGNOSIS — M79674 Pain in right toe(s): Secondary | ICD-10-CM

## 2019-06-24 DIAGNOSIS — B351 Tinea unguium: Secondary | ICD-10-CM | POA: Diagnosis not present

## 2019-06-24 DIAGNOSIS — I739 Peripheral vascular disease, unspecified: Secondary | ICD-10-CM | POA: Diagnosis not present

## 2019-06-24 NOTE — Progress Notes (Signed)
Subjective: Samuel Wilkerson is seen today for follow up chronic hyperkeratotic lesions b/l andpainful, elongated, thickened toenails bilateral feet that he cannot cut. Pain interferes with daily activities. Aggravating factor includes wearing enclosed shoe gear and relieved with periodic debridement.  He relates he lost his sister about 4 weeks ago after she received b/l LE amputations.  Medications reviewed in chart.  No Known Allergies   Objective:  Vascular Examination: Capillary refill time to digits <3 seconds.  Dorsalis pedis pulses faintly palpable b/l.  Posterior tibial pulses nonpalpable b/l.  Digital hair absent b/l.  Skin temperature gradient WNL b/l.   Mild nonpitting ankle edema b/l LE. No warmth. No pain on calf compression.   Dermatological Examination: Skin thin and atrophic b/l.  Toenails 1-5 b/l discolored, thick, dystrophic with subungual debris and pain with palpation to nailbeds due to thickness of nails.  Hyperkeratotic lesions dorsal 5th PIPJ right foot and submet head 3 left foot. No erythema, no edema, no drainage, no flocculence noted.  Musculoskeletal: Muscle strength 5/5 to all LE muscle groups b/l.  Hammertoes 2-5 b/l.  No pain, crepitus or joint limitation noted with ROM.   Neurological Examination: Protective sensation intact with 10 gram monofilament bilaterally.  Epicritic sensation present bilaterally.  Vibratory sensation intact bilaterally.   Assessment: Painful onychomycosis toenails 1-5 b/l  Corn right 5th digit Callus submet head 3 left foot  PAD  Plan: 1. Toenails 1-5 b/l were debrided in length and girth without iatrogenic bleeding. 2. Hyperkeratotic lesions pared right 5th digit and submet head 3 left foot utilizing sterile scalpel blade without incident. 3. Patient to continue soft, supportive shoe gear daily. 4. Patient to report any pedal injuries to medical professional immediately. 5. Follow up 3 months.   6. Patient/POA to call should there be a concern in the interim.

## 2019-07-03 ENCOUNTER — Telehealth: Payer: Self-pay | Admitting: Cardiovascular Disease

## 2019-07-03 NOTE — Telephone Encounter (Signed)
Virtual Visit Pre-Appointment Phone Call  "(Name), I am calling you today to discuss your upcoming appointment. We are currently trying to limit exposure to the virus that causes COVID-19 by seeing patients at home rather than in the office."  "What is the BEST phone number to call the day of the visit?" - [phone]  1. "Do you have or have access to (through a family member/friend) a smartphone with video capability that we can use for your visit?" a. If yes - list this number in appt notes as "cell" (if different from BEST phone #) and list the appointment type as a VIDEO visit in appointment notes b. If no - list the appointment type as a PHONE visit in appointment notes  2. Confirm consent - "In the setting of the current Covid19 crisis, you are scheduled for a (phone or video) visit with your provider on (date) at (time).  Just as we do with many in-office visits, in order for you to participate in this visit, we must obtain consent.  If you'd like, I can send this to your mychart (if signed up) or email for you to review.  Otherwise, I can obtain your verbal consent now.  All virtual visits are billed to your insurance company just like a normal visit would be.  By agreeing to a virtual visit, we'd like you to understand that the technology does not allow for your provider to perform an examination, and thus may limit your provider's ability to fully assess your condition. If your provider identifies any concerns that need to be evaluated in person, we will make arrangements to do so.  Finally, though the technology is pretty good, we cannot assure that it will always work on either your or our end, and in the setting of a video visit, we may have to convert it to a phone-only visit.  In either situation, we cannot ensure that we have a secure connection.  Are you willing to proceed?" STAFF: Did the patient verbally acknowledge consent to telehealth visit? Document YES/NO here: YES    3. Advise patient to be prepared - "Two hours prior to your appointment, go ahead and check your blood pressure, pulse, oxygen saturation, and your weight (if you have the equipment to check those) and write them all down. When your visit starts, your provider will ask you for this information. If you have an Apple Watch or Kardia device, please plan to have heart rate information ready on the day of your appointment. Please have a pen and paper handy nearby the day of the visit as well."  4. Give patient instructions for MyChart download to smartphone OR Doximity/Doxy.me as below if video visit (depending on what platform provider is using)  5. Inform patient they will receive a phone call 15 minutes prior to their appointment time (may be from unknown caller ID) so they should be prepared to answer    TELEPHONE CALL NOTE  Samuel Wilkerson has been deemed a candidate for a follow-up tele-health visit to limit community exposure during the Covid-19 pandemic. I spoke with the patient via phone to ensure availability of phone/video source, confirm preferred email & phone number, and discuss instructions and expectations.  I reminded Samuel Wilkerson to be prepared with any vital sign and/or heart rhythm information that could potentially be obtained via home monitoring, at the time of his visit. I reminded Samuel Wilkerson to expect a phone call prior to his visit.  Vicky  T Slaughter 07/03/2019 9:24 AM   INSTRUCTIONS FOR DOWNLOADING THE MYCHART APP TO SMARTPHONE  - The patient must first make sure to have activated MyChart and know their login information - If Apple, go to [employer] and type in MyChart in the search bar and download the app. If Android, ask patient to go to [employer] and type in [place] in the search bar and download the app. The app is free but as with any other app downloads, their phone may require them to verify saved payment information or Apple/Android password.  - The  patient will need to then log into the app with their MyChart username and password, and select [facility] as their healthcare provider to link the account. When it is time for your visit, go to the MyChart app, find appointments, and click Begin Video Visit. Be sure to Select Allow for your device to access the Microphone and Camera for your visit. You will then be connected, and your provider will be with you shortly.  **If they have any issues connecting, or need assistance please contact MyChart service desk [phone])**  **If using a computer, in order to ensure the best quality for their visit they will need to use either of the following Internet Browsers: [employer] or Google Chrome**  IF USING DOXIMITY or DOXY.ME - The patient will receive a link just prior to their visit by text.     FULL LENGTH CONSENT FOR TELE-HEALTH VISIT   I hereby voluntarily request, consent and authorize [facility] and its employed or contracted physicians, physician assistants, nurse practitioners or other licensed health care professionals (the Practitioner), to provide me with telemedicine health care services (the "Services") as deemed necessary by the treating Practitioner. I acknowledge and consent to receive the Services by the Practitioner via telemedicine. I understand that the telemedicine visit will involve communicating with the Practitioner through live audiovisual communication technology and the disclosure of certain medical information by electronic transmission. I acknowledge that I have been given the opportunity to request an in-person assessment or other available alternative prior to the telemedicine visit and am voluntarily participating in the telemedicine visit.  I understand that I have the right to withhold or withdraw my consent to the use of telemedicine in the course of my care at any time, without affecting my right to future care or treatment, and that the  Practitioner or I may terminate the telemedicine visit at any time. I understand that I have the right to inspect all information obtained and/or recorded in the course of the telemedicine visit and may receive copies of available information for a reasonable fee.  I understand that some of the potential risks of receiving the Services via telemedicine include:  Marland Kitchen Delay or interruption in medical evaluation due to technological equipment failure or disruption; . Information transmitted may not be sufficient (e.g. poor resolution of images) to allow for appropriate medical decision making by the Practitioner; and/or  . In rare instances, security protocols could fail, causing a breach of personal health information.  Furthermore, I acknowledge that it is my responsibility to provide information about my medical history, conditions and care that is complete and accurate to the best of my ability. I acknowledge that Practitioner's advice, recommendations, and/or decision may be based on factors not within their control, such as incomplete or inaccurate data provided by me or distortions of diagnostic images or specimens that may result from electronic transmissions. I understand that the practice  of medicine is not an Chief Strategy Officer and that Practitioner makes no warranties or guarantees regarding treatment outcomes. I acknowledge that I will receive a copy of this consent concurrently upon execution via email to the email address I last provided but may also request a printed copy by calling the office of [facility].    I understand that my insurance will be billed for this visit.   I have read or had this consent read to me. . I understand the contents of this consent, which adequately explains the benefits and risks of the Services being provided via telemedicine.  . I have been provided ample opportunity to ask questions regarding this consent and the Services and have had my questions answered to my  satisfaction. . I give my informed consent for the services to be provided through the use of telemedicine in my medical care  By participating in this telemedicine visit I agree to the above.

## 2019-07-04 ENCOUNTER — Telehealth (INDEPENDENT_AMBULATORY_CARE_PROVIDER_SITE_OTHER): Payer: Medicare Other | Admitting: Family Medicine

## 2019-07-04 ENCOUNTER — Encounter: Payer: Self-pay | Admitting: Cardiovascular Disease

## 2019-07-04 VITALS — BP 120/76 | HR 70 | Ht 76.0 in | Wt 200.0 lb

## 2019-07-04 DIAGNOSIS — E782 Mixed hyperlipidemia: Secondary | ICD-10-CM

## 2019-07-04 DIAGNOSIS — I25118 Atherosclerotic heart disease of native coronary artery with other forms of angina pectoris: Secondary | ICD-10-CM | POA: Diagnosis not present

## 2019-07-04 DIAGNOSIS — I10 Essential (primary) hypertension: Secondary | ICD-10-CM | POA: Diagnosis not present

## 2019-07-04 NOTE — Patient Instructions (Signed)
Medication Instructions:  Your physician recommends that you continue on your current medications as directed. Please refer to the Current Medication list given to you today.  *If you need a refill on your cardiac medications before your next appointment, please call your pharmacy*  Lab Work: None today If you have labs (blood work) drawn today and your tests are completely normal, you will receive your results only by: Marland Kitchen MyChart Message (if you have MyChart) OR . A paper copy in the mail If you have any lab test that is abnormal or we need to change your treatment, we will call you to review the results.  Testing/Procedures: None today  Follow-Up: At [facility], you and your health needs are our priority.  As part of our continuing mission to provide you with exceptional heart care, we have created designated Provider Care Teams.  These Care Teams include your primary Cardiologist (physician) and Advanced Practice Providers (APPs -  Physician Assistants and Nurse Practitioners) who all work together to provide you with the care you need, when you need it.  Your next appointment:   12 month(s)  The format for your next appointment:   Virtual Visit   Provider:   Katina Dung, NP  Other Instructions None   Thank you for choosing [facility] !

## 2019-07-04 NOTE — Progress Notes (Signed)
Virtual Visit via Telephone Note   This visit type was conducted due to national recommendations for restrictions regarding the COVID-19 Pandemic (e.g. social distancing) in an effort to limit this patient's exposure and mitigate transmission in our community.  Due to his co-morbid illnesses, this patient is at least at moderate risk for complications without adequate follow up.  This format is felt to be most appropriate for this patient at this time.  The patient did not have access to video technology/had technical difficulties with video requiring transitioning to audio format only (telephone).  All issues noted in this document were discussed and addressed.  No physical exam could be performed with this format.  Please refer to the patient's chart for his  consent to telehealth for [facility].   Date:  07/04/2019   ID:  Samuel Wilkerson, DOB 21-Apr-1931, MRN [id number]  Patient Location: Home Provider Location: Office  PCP:  Samuel Evens, MD  Cardiologist:  No primary care provider on file.  Electrophysiologist:  None   Evaluation Performed:  Follow-Up Visit  Chief Complaint:    History of Present Illness:    Samuel Wilkerson is a 85 y.o. male transferring care from Dr. Bettina Wilkerson in [facility] to our clinic.  History of coronary artery disease with remote MI in 1993 with previous intervention of the diagonal and follow-up catheterization in 2006 with an EF of 68% and an occluded right coronary artery and 90% stenosis in the first diagonal branch treated medically.  History of hypertension and hyperlipidemia.  Patient denies any recent acute illnesses other than a sore throat treated at [facility], hospitalizations, surgeries, tick bite, or travels.  Patient denies any progressive anginal or exertional symptoms.  States he has been doing very good since he had his catheterization in 2006.  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  He also has a PCP in [place] Dr.  Karie Wilkerson.  He previously saw Dr. Bettina Wilkerson cardiology.  The drive was too far for him so he was referred here because it was closer for him.    The patient does have symptoms concerning for COVID-19 infection (fever, chills, cough, or new shortness of breath).    Past Medical History:  Diagnosis Date  . Coronary artery disease   . Hyperlipidemia   . Hypertension    Past Surgical History:  Procedure Laterality Date  . CARDIAC CATHETERIZATION    . colonoscopy    . CORONARY ANGIOPLASTY    . TONSILLECTOMY       Current Meds  Medication Sig  . albuterol (PROVENTIL HFA;VENTOLIN HFA) 108 (90 Base) MCG/ACT inhaler Inhale 1-2 puffs into the lungs every 6 (six) hours as needed for wheezing or shortness of breath.  Marland Kitchen amLODipine (NORVASC) 10 MG tablet Take 5 mg by mouth daily.   Marland Kitchen amoxicillin (AMOXIL) 500 MG tablet Take 500 mg by mouth 3 (three) times daily.  Marland Kitchen aspirin EC 81 MG tablet Take 81 mg by mouth daily.   Marland Kitchen azithromycin 250 mg in dextrose 5 % 125 mL Place 1 drop into both eyes nightly.  . cetirizine (ZYRTEC) 10 MG tablet Take 10 mg by mouth daily.  . chlordiazePOXIDE (LIBRIUM) 10 MG capsule Take 20 mg by mouth at bedtime.  . Cholecalciferol (VITAMIN D3) 25 MCG (1000 UT) CAPS Take 1,000 Units by mouth daily.   . dorzolamide (TRUSOPT) 2 % ophthalmic solution Place 1 drop into both eyes 2 (two) times daily.  . fluticasone (FLONASE) 50 MCG/ACT  nasal spray Place 1 spray into both nostrils daily.  Marland Kitchen guaiFENesin 200 MG tablet Take 200 mg by mouth 2 (two) times a day.  . hydrochlorothiazide (HYDRODIURIL) 50 MG tablet Take 50 mg by mouth daily.   . hydrocortisone-pramoxine (PROCTOFOAM-HC) rectal foam Place 1 applicator rectally as needed for hemorrhoids or itching.  Marland Kitchen ipratropium (ATROVENT) 0.03 % nasal spray Place 1 spray into both nostrils every morning.  Marland Kitchen lisinopril (PRINIVIL,ZESTRIL) 20 MG tablet Take 10 mg by mouth daily.   . metoprolol (TOPROL-XL) 50 MG 24 hr tablet Take 50 mg by mouth 2  (two) times a day.   . Miconazole Nitrate 2 % KIT Apply topically.  . montelukast (SINGULAIR) 10 MG tablet Take 10 mg by mouth at bedtime.  . naproxen (NAPROSYN) 375 MG tablet Take 375 mg by mouth 2 (two) times daily with a meal.  . nitroGLYCERIN (NITROSTAT) 0.4 MG SL tablet Place 0.4 mg under the tongue every 5 (five) minutes as needed. For chest pain   . omeprazole (PRILOSEC) 20 MG capsule Take 20 mg by mouth 2 (two) times daily before a meal.  . pantoprazole (PROTONIX) 40 MG tablet Take 1 tablet (40 mg total) by mouth daily.  . polyethylene glycol (MIRALAX / GLYCOLAX) packet Take 17 g by mouth daily.    . potassium chloride (K-DUR) 10 MEQ tablet Take 10 mEq by mouth daily.  . rosuvastatin (CRESTOR) 40 MG tablet Take 40 mg by mouth daily.   Marland Kitchen terbinafine (LAMISIL) 1 % cream Apply topically daily.   Marland Kitchen triamcinolone cream (KENALOG) 0.5 % Apply 1 application topically as needed.      Allergies:   Patient has no known allergies.   Social History   Tobacco Use  . Smoking status: Never Smoker  . Smokeless tobacco: Never Used  Substance Use Topics  . Alcohol use: No    Alcohol/week: 0.0 standard drinks  . Drug use: No     Family Hx: The patient's family history includes Diabetes in his sister; Heart attack in his brother; Stroke in his brother.  ROS:   Please see the history of present illness.    All other systems reviewed and are negative.   Prior CV studies:   The following studies were reviewed today:   No cardiovascular studies to review  Labs/Other Tests and Data Reviewed:    EKG:  An ECG dated October 04, 2017 was personally reviewed today and demonstrated:  Sinus rhythm 70  Recent Labs: No results found for requested labs within last 8760 hours.   Recent Lipid Panel No results found for: CHOL, TRIG, HDL, CHOLHDL, LDLCALC, LDLDIRECT  Wt Readings from Last 3 Encounters:  07/04/19 200 lb (90.7 kg)  06/19/19 207 lb (93.9 kg)  11/26/18 205 lb (93 kg)     Objective:     Vital Signs:  BP 120/76   Pulse 70   Ht _0  (1.93 m)   Wt 200 lb (90.7 kg)   BMI 24.34 kg/m    VITAL SIGNS:  reviewed   Normal speech pattern.  No evidence of cough, wheezing, or dyspnea  ASSESSMENT & PLAN:    1. Coronary artery disease of native artery of native heart with stable angina pectoris ([place])  remote MI in 1993 with previous intervention of the diagonal and follow-up catheterization in 2006 with an EF of 68% and an occluded right coronary artery and 90% stenosis in the first diagonal branch treated medically.  Patient denies any progressive anginal or exertional symptoms.  Continue aspirin 81 mg, Toprol-XL 50 mg, and nitroglycerin sublingual as needed  2. Essential hypertension Blood pressure within normal limits today.  Continue lisinopril 20 mg daily and hydrochlorothiazide 50 mg daily.  3. Mixed hyperlipidemia Continue Crestor 40 mg daily.   COVID-19 Education: The signs and symptoms of COVID-19 were discussed with the patient and how to seek care for testing (follow up with PCP or arrange E-visit).  The importance of social distancing was discussed today.  Time:   Today, I have spent 15 minutes with the patient with telehealth technology discussing the above problems.     Medication Adjustments/Labs and Tests Ordered: Current medicines are reviewed at length with the patient today.  Concerns regarding medicines are outlined above.   Tests Ordered: No orders of the defined types were placed in this encounter.   Medication Changes: No orders of the defined types were placed in this encounter.   Follow Up:  Either In Person or Virtual in 1 year(s)  Signed, Verta Ellen, NP  07/04/2019 9:06 AM    [facility]

## 2019-07-30 ENCOUNTER — Encounter: Payer: Self-pay | Admitting: Podiatry

## 2019-07-30 ENCOUNTER — Other Ambulatory Visit: Payer: Self-pay

## 2019-07-30 ENCOUNTER — Ambulatory Visit (INDEPENDENT_AMBULATORY_CARE_PROVIDER_SITE_OTHER): Payer: Medicare PPO | Admitting: Podiatry

## 2019-07-30 DIAGNOSIS — L03031 Cellulitis of right toe: Secondary | ICD-10-CM | POA: Diagnosis not present

## 2019-07-30 NOTE — Patient Instructions (Signed)

## 2019-07-31 NOTE — Progress Notes (Signed)
He presents today chief concern of a painful ingrown nail to the tibiofibular border of the hallux of the right foot.  States that is painful around the toenail and since pains up through my leg.  Objective: Vital signs are stable alert oriented x3.  Pulses are palpable.  Reproducible pain today to the tibia-fibula borders of the hallux right with minimal erythema no edema cellulitis drainage odor no open lesions or wounds noted.  Assessment: Mild paronychia ingrown margins hallux bilateral.  Plan: At this point I think just taking the margins out to see if this will alleviate him symptoms is the best thing to do I do not want to perform a chemical matrixectomy with his limited vascular status distally.  Partial temporary nail avulsion was performed today after local anesthetic was administered.  He tolerated procedure well he understands the soaking he was provided with both oral and written home-going instructions for the care and soaking of the toe.  Follow-up with him in 2 weeks

## 2019-08-05 ENCOUNTER — Telehealth: Payer: Self-pay

## 2019-08-05 NOTE — Telephone Encounter (Signed)
I have placed a referral to [facility] regarding an ENT visit for Chronic Pharyngitis.  Sorry for the delay on this referral. I must have overlooked it.

## 2019-08-05 NOTE — Telephone Encounter (Signed)
No problem. Thanks, [place]!   Malachi Bonds, MD [facility]

## 2019-08-13 ENCOUNTER — Ambulatory Visit (INDEPENDENT_AMBULATORY_CARE_PROVIDER_SITE_OTHER): Payer: Medicare PPO | Admitting: Podiatry

## 2019-08-13 ENCOUNTER — Other Ambulatory Visit: Payer: Self-pay

## 2019-08-13 ENCOUNTER — Encounter: Payer: Self-pay | Admitting: Podiatry

## 2019-08-13 DIAGNOSIS — Z9889 Other specified postprocedural states: Secondary | ICD-10-CM

## 2019-08-13 DIAGNOSIS — L03031 Cellulitis of right toe: Secondary | ICD-10-CM

## 2019-08-13 NOTE — Telephone Encounter (Signed)
Samuel Wilkerson from [facility] is received the ENT referral and is asking for the most recent ov notes to be faxed to [phone] attn: michelle.

## 2019-08-13 NOTE — Progress Notes (Signed)
He presents today for follow-up of his incision and drainage to his right great toe.  He denies fever chills nausea vomiting muscle aches and pain states that is doing so much better he is very happy.  Objective: Vital signs are stable he is alert oriented x3 no erythema edema cellulitis drainage or odor incision and drainage is gone on to heal uneventfully with no pain on palpation.  Assessment: Well-healing surgical toe.  Plan: Follow-up with [place] as needed.

## 2019-08-13 NOTE — Telephone Encounter (Signed)
I have re faxed this documentation to their office.   Thanks

## 2019-08-14 ENCOUNTER — Telehealth: Payer: Self-pay

## 2019-08-14 NOTE — Telephone Encounter (Signed)
Referral request has been placed to [facility].

## 2019-09-23 ENCOUNTER — Other Ambulatory Visit: Payer: Self-pay

## 2019-09-23 ENCOUNTER — Encounter: Payer: Self-pay | Admitting: Podiatry

## 2019-09-23 ENCOUNTER — Ambulatory Visit (INDEPENDENT_AMBULATORY_CARE_PROVIDER_SITE_OTHER): Payer: Medicare PPO | Admitting: Podiatry

## 2019-09-23 DIAGNOSIS — M79674 Pain in right toe(s): Secondary | ICD-10-CM

## 2019-09-23 DIAGNOSIS — L84 Corns and callosities: Secondary | ICD-10-CM

## 2019-09-23 DIAGNOSIS — I739 Peripheral vascular disease, unspecified: Secondary | ICD-10-CM

## 2019-09-23 DIAGNOSIS — B351 Tinea unguium: Secondary | ICD-10-CM

## 2019-09-23 DIAGNOSIS — M79675 Pain in left toe(s): Secondary | ICD-10-CM

## 2019-09-23 NOTE — Patient Instructions (Signed)
Ingrown Toenail An ingrown toenail occurs when the corner or sides of a toenail grow into the surrounding skin. This causes discomfort and pain. The big toe is most commonly affected, but any of the toes can be affected. If an ingrown toenail is not treated, it can become infected. What are the causes? This condition may be caused by:  Wearing shoes that are too small or tight.  An injury, such as stubbing your toe or having your toe stepped on.  Improper cutting or care of your toenails.  Having nail or foot abnormalities that were present from birth (congenital abnormalities), such as having a nail that is too big for your toe. What increases the risk? The following factors may make you more likely to develop ingrown toenails:  Age. Nails tend to get thicker with age, so ingrown nails are more common among older people.  Cutting your toenails incorrectly, such as cutting them very short or cutting them unevenly. An ingrown toenail is more likely to get infected if you have:  Diabetes.  Blood flow (circulation) problems. What are the signs or symptoms? Symptoms of an ingrown toenail may include:  Pain, soreness, or tenderness.  Redness.  Swelling.  Hardening of the skin that surrounds the toenail. Signs that an ingrown toenail may be infected include:  Fluid or pus.  Symptoms that get worse instead of better. How is this diagnosed? An ingrown toenail may be diagnosed based on your medical history, your symptoms, and a physical exam. If you have fluid or blood coming from your toenail, a sample may be collected to test for the specific type of bacteria that is causing the infection. How is this treated? Treatment depends on how severe your ingrown toenail is. You may be able to care for your toenail at home.  If you have an infection, you may be prescribed antibiotic medicines.  If you have fluid or pus draining from your toenail, your health care provider may drain  it.  If you have trouble walking, you may be given crutches to use.  If you have a severe or infected ingrown toenail, you may need a procedure to remove part or all of the nail. Follow these instructions at home: Foot care   Do not pick at your toenail or try to remove it yourself.  Soak your foot in warm, soapy water. Do this for 20 minutes, 3 times a day, or as often as told by your health care provider. This helps to keep your toe clean and keep your skin soft.  Wear shoes that fit well and are not too tight. Your health care provider may recommend that you wear open-toed shoes while you heal.  Trim your toenails regularly and carefully. Cut your toenails straight across to prevent injury to the skin at the corners of the toenail. Do not cut your nails in a curved shape.  Keep your feet clean and dry to help prevent infection. Medicines  Take over-the-counter and prescription medicines only as told by your health care provider.  If you were prescribed an antibiotic, take it as told by your health care provider. Do not stop taking the antibiotic even if you start to feel better. Activity  Return to your normal activities as told by your health care provider. Ask your health care provider what activities are safe for you.  Avoid activities that cause pain. General instructions  If your health care provider told you to use crutches to help you move around, use them   as instructed.  Keep all follow-up visits as told by your health care provider. This is important. Contact a health care provider if:  You have more redness, swelling, pain, or other symptoms that do not improve with treatment.  You have fluid, blood, or pus coming from your toenail. Get help right away if:  You have a red streak on your skin that starts at your foot and spreads up your leg.  You have a fever. Summary  An ingrown toenail occurs when the corner or sides of a toenail grow into the surrounding  skin. This causes discomfort and pain. The big toe is most commonly affected, but any of the toes can be affected.  If an ingrown toenail is not treated, it can become infected.  Fluid or pus draining from your toenail is a sign of infection. Your health care provider may need to drain it. You may be given antibiotics to treat the infection.  Trimming your toenails regularly and properly can help you prevent an ingrown toenail. This information is not intended to replace advice given to you by your health care provider. Make sure you discuss any questions you have with your health care provider. Document Revised: 10/19/2018 Document Reviewed: 03/15/2017 Elsevier Patient Education  2020 [employer]  

## 2019-09-23 NOTE — Progress Notes (Signed)
Subjective: Samuel Wilkerson presents today for follow up of for at risk foot care. Patient has h/o PAD and painful mycotic nails b/l that are difficult to trim. Pain interferes with ambulation. Aggravating factors include wearing enclosed shoe gear. Pain is relieved with periodic professional debridement.   He underwent ingrown toenail procedure in January and states his right great toe feels better. He admits he had to stop wearing dress shoes as they were causing pain to his digits. He now wears sneakers.  [facility] states [facility] two pair of shoes/year. He is requesting shoe recommendations for the [facility]  No Known Allergies   Objective: There were no vitals filed for this visit.  Pt 86 y.o. year old AA  male WD, WN in NAD. AAO x 3.   Vascular Examination:  Capillary fill time to digits <3 seconds b/l. Palpable DP pulses b/l. Nonpalpable PT pulses b/l. Pedal hair absent b/l Skin temperature gradient within normal limits b/l. No pain with calf compression b/l.  Dermatological Examination: Pedal skin with normal turgor, texture and tone bilaterally. No open wounds bilaterally. No interdigital macerations bilaterally. Toenails 1-5 b/l elongated, dystrophic, thickened, crumbly with subungual debris and tenderness to dorsal palpation. Hyperkeratotic lesion(s) dorsal aspect right 5th PIPJ and submet head 3 left foot.  No erythema, no edema, no drainage, no flocculence.   S/p partial nail avulsion right 5th digit completely healed.   Musculoskeletal: Normal muscle strength 5/5 to all lower extremity muscle groups bilaterally, no pain crepitus or joint limitation noted with ROM b/l, hammertoes noted to the  2-5 bilaterally and patient ambulates independent of any assistive aids  Neurological: Protective sensation intact 5/5 intact bilaterally with 10g monofilament b/l  Assessment: 1. Corns and callosities   2. Pain due to onychomycosis of toenails of both feet   3. PAD (peripheral  artery disease) (HCC)    Plan: -Toenails 1-5 b/l were debrided in length and girth with sterile nail nippers and dremel without iatrogenic bleeding.  -Corns right 5th digit and calluses submet head 3 left foot were debrided without complication or incident. Total number debrided =2. -Patient to continue soft, supportive shoe gear daily. -Rx written and given to Samuel Wilkerson for shoes with stretchable uppers only; no leather shoes. Diagnoses: Hammertoe with corn formation right 5th digit. -Patient to report any pedal injuries to medical professional immediately. -Patient/POA to call should there be question/concern in the interim.  Return in about 10 weeks (around 12/02/2019) for nail and callus trim.

## 2019-10-08 ENCOUNTER — Telehealth: Payer: Self-pay | Admitting: Podiatry

## 2019-10-08 NOTE — Telephone Encounter (Signed)
Pt stated that he would like Dr.Galaway to fax a script for diabetic shoes to [facility] [phone] please advise

## 2019-10-18 ENCOUNTER — Telehealth: Payer: Self-pay | Admitting: Podiatry

## 2019-10-18 NOTE — Telephone Encounter (Signed)
Left message for Clifton Custard Bage @ [facility] to call to discuss pts prescription that was sent there. It was not for a PO but for them to dispense diabetic shoes/inserts thru there prosthetic dept.

## 2019-10-22 NOTE — Telephone Encounter (Signed)
Pt returned my call and left message this am..  I returned call and spoke to pt he has an appt tomorrow with the [facility]. I explained that he has to see Podiatry at the [facility] for them to get the shoes at the [place]. Pt stated he is going to go tomorrow when he is there to get an appt with the podiatry dept.

## 2019-11-06 ENCOUNTER — Ambulatory Visit (INDEPENDENT_AMBULATORY_CARE_PROVIDER_SITE_OTHER): Payer: Medicare PPO | Admitting: Allergy & Immunology

## 2019-11-06 ENCOUNTER — Other Ambulatory Visit: Payer: Self-pay

## 2019-11-06 ENCOUNTER — Encounter: Payer: Self-pay | Admitting: Allergy & Immunology

## 2019-11-06 VITALS — BP 110/70 | HR 60 | Temp 97.4°F | Resp 12

## 2019-11-06 DIAGNOSIS — J3089 Other allergic rhinitis: Secondary | ICD-10-CM | POA: Diagnosis not present

## 2019-11-06 DIAGNOSIS — J310 Chronic rhinitis: Secondary | ICD-10-CM | POA: Diagnosis not present

## 2019-11-06 DIAGNOSIS — K219 Gastro-esophageal reflux disease without esophagitis: Secondary | ICD-10-CM | POA: Diagnosis not present

## 2019-11-06 DIAGNOSIS — J302 Other seasonal allergic rhinitis: Secondary | ICD-10-CM

## 2019-11-06 MED ORDER — FLUTICASONE PROPIONATE 50 MCG/ACT NA SUSP: 1.0000 | Freq: Every day | NASAL | 2 refills | Status: DC

## 2019-11-06 MED ORDER — PANTOPRAZOLE SODIUM 40 MG PO TBEC: 40.0000 mg | DELAYED_RELEASE_TABLET | Freq: Every day | ORAL | 1 refills | Status: DC

## 2019-11-06 MED ORDER — CETIRIZINE HCL 10 MG PO TABS: 10.0000 mg | ORAL_TABLET | Freq: Every day | ORAL | 1 refills | Status: DC

## 2019-11-06 MED ORDER — IPRATROPIUM BROMIDE 0.03 % NA SOLN: 1.0000 | Freq: Every morning | NASAL | 5 refills | Status: DC

## 2019-11-06 NOTE — Progress Notes (Signed)
FOLLOW UP  Date of Service/Encounter:  11/06/19   Assessment:   Recurrent pharyngitis - ? GERD  Perennial and seasonal allergicrhinitis(dust mites, dog, cockroach, indoor and outdoor molds, trees, ragweed, and weeds)   Plan/Recommendations:   1. Perennial and seasonal allergic rhinitis (dust mites, dog, cockroach, indoor and outdoor molds, trees, ragweed, and weeds) - Continue with all of your allergy medications.  - It seems that you are doing a good job with your symptoms now.  - Continue with fluticasone EVERY DAY for the best effects. - Continue with nasal ipratropium one spray per nostril EVERY MORNING to help dry out of your nose and prevent the postnasal drip.  - Continue with cetirizine 10mg  EVERY DAY.    2. GERD - Continue with pantoprazole 40mg  daily.   3. Return in about 6 months (around 05/07/2020). This can be an in-person, a virtual Webex or a telephone follow up visit.   Subjective:   Samuel Wilkerson is a 86 y.o. male presenting today for follow up of  Chief Complaint  Patient presents with  . Allergic Rhinitis     Samuel Wilkerson has a history of the following: Patient Active Problem List   Diagnosis Date Noted  . Hyperlipidemia 11/26/2018  . CAD (coronary artery disease), native coronary artery 11/26/2018  . Hypertension 11/26/2018  . Seasonal and perennial allergic rhinitis 11/07/2018  . 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  Samuel Wilkerson is a 86 y.o. male presenting for a follow up visit.  He was last seen in December 2020.  At that time, we continue with all of his allergy medications.  We recommended using Flonase every day and starting nasal ipratropium 1 spray per nostril every morning.  We also continue with cetirizine 10 mg daily.  We did discuss allergen immunotherapy, but I was hesitant to put an 86 year old on it.  For his pharyngitis, I started Augmentin twice a day for 1 week.  We also  referred to ENT for evaluation of his throat.  We stopped his omeprazole and started pantoprazole 40 mg daily.  Since last visit, he has done well.  He knows he is on at least one nose spray, but he seems confused when I asked her about 2 of them.  He has not had any antibiotics from what I can tell since the last time I saw him.  He does continue to endorse some sore throat.  I asked him whether he has seen otolaryngology and he seems confused by the question.  I do not see any otolaryngology notes in our system.   He did receive his COVID-19 vaccine.  He tells me that he got the [employer] vaccine at the [place] in [place].  He did not have any reaction to it at all, which prompts him to tell me that he thinks he might have gotten water.  Otherwise, there have been no changes to his past medical history, surgical history, family history, or social history.    Review of Systems  Constitutional: Negative.  Negative for chills, fever, malaise/fatigue and weight loss.  HENT: Negative.  Negative for congestion, ear discharge, ear pain and nosebleeds.   Eyes: Negative for pain, discharge and redness.  Respiratory: Negative for cough, sputum production, shortness of breath and wheezing.   Cardiovascular: Negative.  Negative for chest pain and palpitations.  Gastrointestinal: Negative for abdominal pain, constipation, diarrhea, heartburn, nausea and vomiting.  Skin: Negative.  Negative for itching  and rash.  Neurological: Negative for dizziness and headaches.  Endo/Heme/Allergies: Negative for environmental allergies. Does not bruise/bleed easily.       Objective:   Blood pressure 110/70, pulse 60, temperature (!) 97.4 F (36.3 C), temperature source Temporal, resp. rate 12. There is no height or weight on file to calculate BMI.   Physical Exam:  Physical Exam  Constitutional: He appears well-developed and well-nourished.  Very stoic male.  HENT:  Head: Normocephalic and atraumatic.   Right Ear: Tympanic membrane, external ear and ear canal normal.  Left Ear: Tympanic membrane, external ear and ear canal normal.  Nose: Rhinorrhea present. No mucosal edema, nasal deformity or septal deviation. No epistaxis. Right sinus exhibits no maxillary sinus tenderness and no frontal sinus tenderness. Left sinus exhibits no maxillary sinus tenderness and no frontal sinus tenderness.  Mouth/Throat: Uvula is midline and oropharynx is clear and moist. Mucous membranes are not pale and not dry.  Tonsils absent. No oropharyngeal swelling.   Eyes: Pupils are equal, round, and reactive to light. Conjunctivae and EOM are normal. Right eye exhibits no chemosis and no discharge. Left eye exhibits no chemosis and no discharge. Right conjunctiva is not injected. Left conjunctiva is not injected.  Cardiovascular: Normal rate, regular rhythm and normal heart sounds.  Respiratory: Effort normal and breath sounds normal. No accessory muscle usage. No tachypnea. No respiratory distress. He has no wheezes. He has no rhonchi. He has no rales. He exhibits no tenderness.  Moving air well in all lung fields.   Lymphadenopathy:    He has no cervical adenopathy.  Neurological: He is alert.  Skin: No abrasion, no petechiae and no rash noted. Rash is not papular, not vesicular and not urticarial. No erythema. No pallor.  Psychiatric: He has a normal mood and affect.     Diagnostic studies: none   Malachi Bonds, MD  [facility]

## 2019-11-06 NOTE — Patient Instructions (Addendum)
1. Perennial and seasonal allergic rhinitis (dust mites, dog, cockroach, indoor and outdoor molds, trees, ragweed, and weeds) - Continue with all of your allergy medications.  - It seems that you are doing a good job with your symptoms now.  - Continue with fluticasone EVERY DAY for the best effects. - Continue with nasal ipratropium one spray per nostril EVERY MORNING to help dry out of your nose and prevent the postnasal drip.  - Continue with cetirizine 10mg  EVERY DAY.    2. GERD - Continue with pantoprazole 40mg  daily.   3. Return in about 6 months (around 05/07/2020). This can be an in-person, a virtual Webex or a telephone follow up visit.   Please inform us of any Emergency Department visits, hospitalizations, or changes in symptoms. Call us before going to the ED for breathing or allergy symptoms since we might be able to fit you in for a sick visit. Feel free to contact us anytime with any questions, problems, or concerns.  It was a pleasure to talk to you today today!  Websites that have reliable patient information: 1. American Academy of Asthma, Allergy, and Immunology: www.aaaai.org 2. Food Allergy Research and Education (FARE): foodallergy.org 3. Mothers of Asthmatics: http://www.asthmacommunitynetwork.org 4. American College of Allergy, Asthma, and Immunology: www.acaai.org  "Like" [place] on Facebook and Instagram for our latest updates!      Make sure you are registered to vote! If you have moved or changed any of your contact information, you will need to get this updated before voting!       Allergy Shots   Allergies are the result of a chain reaction that starts in the immune system. Your immune system controls how your body defends itself. For instance, if you have an allergy to pollen, your immune system identifies pollen as an invader or allergen. Your immune system overreacts by producing antibodies called Immunoglobulin E (IgE). These antibodies travel to cells  that release chemicals, causing an allergic reaction.  The concept behind allergy immunotherapy, whether it is received in the form of shots or tablets, is that the immune system can be desensitized to specific allergens that trigger allergy symptoms. Although it requires time and patience, the payback can be long-term relief.  How Do Allergy Shots Work?  Allergy shots work much like a vaccine. Your body responds to injected amounts of a particular allergen given in increasing doses, eventually developing a resistance and tolerance to it. Allergy shots can lead to decreased, minimal or no allergy symptoms.  There generally are two phases: build-up and maintenance. Build-up often ranges from three to six months and involves receiving injections with increasing amounts of the allergens. The shots are typically given once or twice a week, though more rapid build-up schedules are sometimes used.  The maintenance phase begins when the most effective dose is reached. This dose is different for each person, depending on how allergic you are and your response to the build-up injections. Once the maintenance dose is reached, there are longer periods between injections, typically two to four weeks.  Occasionally doctors give cortisone-type shots that can temporarily reduce allergy symptoms. These types of shots are different and should not be confused with allergy immunotherapy shots.  Who Can Be Treated with Allergy Shots?  Allergy shots may be a good treatment approach for people with allergic rhinitis (hay fever), allergic asthma, conjunctivitis (eye allergy) or stinging insect allergy.   Before deciding to begin allergy shots, you should consider:  . The length of allergy  season and the severity of your symptoms . Whether medications and/or changes to your environment can control your symptoms . Your desire to avoid long-term medication use . Time: allergy immunotherapy requires a major time  commitment . Cost: may vary depending on your insurance coverage  Allergy shots for children age 54 and older are effective and often well tolerated. They might prevent the onset of new allergen sensitivities or the progression to asthma.  Allergy shots are not started on patients who are pregnant but can be continued on patients who become pregnant while receiving them. In some patients with other medical conditions or who take certain common medications, allergy shots may be of risk. It is important to mention other medications you talk to your allergist.   When Will I Feel Better?  Some may experience decreased allergy symptoms during the build-up phase. For others, it may take as long as 12 months on the maintenance dose. If there is no improvement after a year of maintenance, your allergist will discuss other treatment options with you.  If you aren't responding to allergy shots, it may be because there is not enough dose of the allergen in your vaccine or there are missing allergens that were not identified during your allergy testing. Other reasons could be that there are high levels of the allergen in your environment or major exposure to non-allergic triggers like tobacco smoke.  What Is the Length of Treatment?  Once the maintenance dose is reached, allergy shots are generally continued for three to five years. The decision to stop should be discussed with your allergist at that time. Some people may experience a permanent reduction of allergy symptoms. Others may relapse and a longer course of allergy shots can be considered.  What Are the Possible Reactions?  The two types of adverse reactions that can occur with allergy shots are local and systemic. Common local reactions include very mild redness and swelling at the injection site, which can happen immediately or several hours after. A systemic reaction, which is less common, affects the entire body or a particular body system.  They are usually mild and typically respond quickly to medications. Signs include increased allergy symptoms such as sneezing, a stuffy nose or hives.  Rarely, a serious systemic reaction called anaphylaxis can develop. Symptoms include swelling in the throat, wheezing, a feeling of tightness in the chest, nausea or dizziness. Most serious systemic reactions develop within 30 minutes of allergy shots. This is why it is strongly recommended you wait in your doctor's office for 30 minutes after your injections. Your allergist is trained to watch for reactions, and his or her staff is trained and equipped with the proper medications to identify and treat them.  Who Should Administer Allergy Shots?  The preferred location for receiving shots is your prescribing allergist's office. Injections can sometimes be given at another facility where the physician and staff are trained to recognize and treat reactions, and have received instructions by your prescribing allergist.

## 2019-11-07 ENCOUNTER — Telehealth: Payer: Self-pay

## 2019-11-07 NOTE — Telephone Encounter (Signed)
Bonita Quin called stating she was returning your call regarding patient's referral. She states patient needs to contact his PCP Leretha Pol at [facility] for allergy referral and once that's done she will take care of it. She states his original referral expired 08/2019.  CB# [id number] [phone]

## 2019-11-08 NOTE — Telephone Encounter (Signed)
I will give the patient a call on Monday to inform him of this information.  Thank You

## 2019-11-12 NOTE — Telephone Encounter (Signed)
I have placed another referral request to the [facility].

## 2019-11-14 ENCOUNTER — Other Ambulatory Visit: Payer: Self-pay

## 2019-11-14 ENCOUNTER — Ambulatory Visit: Payer: Medicare PPO | Admitting: Orthotics

## 2019-11-14 DIAGNOSIS — I739 Peripheral vascular disease, unspecified: Secondary | ICD-10-CM

## 2019-11-14 NOTE — Progress Notes (Signed)
Not eligible as he is not diabetic and has not been referred to [place] by [facility].

## 2019-12-10 ENCOUNTER — Other Ambulatory Visit: Payer: Self-pay

## 2019-12-10 ENCOUNTER — Encounter: Payer: Self-pay | Admitting: Podiatry

## 2019-12-10 ENCOUNTER — Ambulatory Visit (INDEPENDENT_AMBULATORY_CARE_PROVIDER_SITE_OTHER): Payer: Medicare PPO | Admitting: Podiatry

## 2019-12-10 VITALS — Temp 97.7°F

## 2019-12-10 DIAGNOSIS — I739 Peripheral vascular disease, unspecified: Secondary | ICD-10-CM | POA: Diagnosis not present

## 2019-12-10 DIAGNOSIS — M79675 Pain in left toe(s): Secondary | ICD-10-CM

## 2019-12-10 DIAGNOSIS — B351 Tinea unguium: Secondary | ICD-10-CM

## 2019-12-10 DIAGNOSIS — M79674 Pain in right toe(s): Secondary | ICD-10-CM

## 2019-12-10 DIAGNOSIS — L84 Corns and callosities: Secondary | ICD-10-CM | POA: Diagnosis not present

## 2019-12-10 NOTE — Patient Instructions (Addendum)
Skecher's with stretchable uppers   Corns and Calluses Corns are small areas of thickened skin that occur on the top, sides, or tip of a toe. They contain a cone-shaped core with a point that can press on a nerve below. This causes pain.  Calluses are areas of thickened skin that can occur anywhere on the body, including the hands, fingers, palms, soles of the feet, and heels. Calluses are usually larger than corns. What are the causes? Corns and calluses are caused by rubbing (friction) or pressure, such as from shoes that are too tight or do not fit properly. What increases the risk? Corns are more likely to develop in people who have misshapen toes (toe deformities), such as hammer toes. Calluses can occur with friction to any area of the skin. They are more likely to develop in people who:  Work with their hands.  Wear shoes that fit poorly, are too tight, or are high-heeled.  Have toe deformities. What are the signs or symptoms? Symptoms of a corn or callus include:  A hard growth on the skin.  Pain or tenderness under the skin.  Redness and swelling.  Increased discomfort while wearing tight-fitting shoes, if your feet are affected. If a corn or callus becomes infected, symptoms may include:  Redness and swelling that gets worse.  Pain.  Fluid, blood, or pus draining from the corn or callus. How is this diagnosed? Corns and calluses may be diagnosed based on your symptoms, your medical history, and a physical exam. How is this treated? Treatment for corns and calluses may include:  Removing the cause of the friction or pressure. This may involve: ? Changing your shoes. ? Wearing shoe inserts (orthotics) or other protective layers in your shoes, such as a corn pad. ? Wearing gloves.  Applying medicine to the skin (topical medicine) to help soften skin in the hardened, thickened areas.  Removing layers of dead skin with a file to reduce the size of the corn or  callus.  Removing the corn or callus with a scalpel or laser.  Taking antibiotic medicines, if your corn or callus is infected.  Having surgery, if a toe deformity is the cause. Follow these instructions at home:   Take over-the-counter and prescription medicines only as told by your health care provider.  If you were prescribed an antibiotic, take it as told by your health care provider. Do not stop taking it even if your condition starts to improve.  Wear shoes that fit well. Avoid wearing high-heeled shoes and shoes that are too tight or too loose.  Wear any padding, protective layers, gloves, or orthotics as told by your health care provider.  Soak your hands or feet and then use a file or pumice stone to soften your corn or callus. Do this as told by your health care provider.  Check your corn or callus every day for symptoms of infection. Contact a health care provider if you:  Notice that your symptoms do not improve with treatment.  Have redness or swelling that gets worse.  Notice that your corn or callus becomes painful.  Have fluid, blood, or pus coming from your corn or callus.  Have new symptoms. Summary  Corns are small areas of thickened skin that occur on the top, sides, or tip of a toe.  Calluses are areas of thickened skin that can occur anywhere on the body, including the hands, fingers, palms, and soles of the feet. Calluses are usually larger than corns.  Corns and calluses are caused by rubbing (friction) or pressure, such as from shoes that are too tight or do not fit properly.  Treatment may include wearing any padding, protective layers, gloves, or orthotics as told by your health care provider. This information is not intended to replace advice given to you by your health care provider. Make sure you discuss any questions you have with your health care provider. Document Revised: 10/17/2018 Document Reviewed: 05/10/2017 Elsevier Patient Education   2020 [employer].

## 2019-12-10 NOTE — Progress Notes (Signed)
Subjective: Samuel Wilkerson is a 86 y.o. male patient seen today for at risk foot care. Patient has h/o PAD and painful corn(s) right 5th digit and callus(es) plantar left foot and painful mycotic nails b/l.  Pain interferes with ambulation. Aggravating factors include wearing enclosed shoe gear.   He states it is difficult for him to find a comfortable shoe to wear. He is awaiting shoes from [facility].  Patient Active Problem List   Diagnosis Date Noted  . Hyperlipidemia 11/26/2018  . CAD (coronary artery disease), native coronary artery 11/26/2018  . Hypertension 11/26/2018  . Seasonal and perennial allergic rhinitis 11/07/2018  . Primary open-angle glaucoma 12/26/2016    Current Outpatient Medications on File Prior to Visit  Medication Sig Dispense Refill  . albuterol (PROVENTIL HFA;VENTOLIN HFA) 108 (90 Base) MCG/ACT inhaler Inhale 1-2 puffs into the lungs every 6 (six) hours as needed for wheezing or shortness of breath. 1 Inhaler 0  . amLODipine (NORVASC) 10 MG tablet Take 5 mg by mouth daily.     . amoxicillin (AMOXIL) 500 MG tablet Take 500 mg by mouth 3 (three) times daily.    . amoxicillin-clavulanate (AUGMENTIN) 875-125 MG tablet     . aspirin EC 81 MG tablet Take 81 mg by mouth daily.     . azithromycin 250 mg in dextrose 5 % 125 mL Place 1 drop into both eyes nightly.    . cetirizine (ZYRTEC) 10 MG tablet Take 1 tablet (10 mg total) by mouth daily. 90 tablet 1  . chlordiazePOXIDE (LIBRIUM) 10 MG capsule Take 20 mg by mouth at bedtime.    . Cholecalciferol (VITAMIN D3) 25 MCG (1000 UT) CAPS Take 1,000 Units by mouth daily.     . dorzolamide (TRUSOPT) 2 % ophthalmic solution Place 1 drop into both eyes 2 (two) times daily.    . fluticasone (FLONASE) 50 MCG/ACT nasal spray Place 1 spray into both nostrils daily. 16 g 2  . guaiFENesin 200 MG tablet Take 200 mg by mouth 2 (two) times a day.    . hydrochlorothiazide (HYDRODIURIL) 50 MG tablet Take 50 mg by mouth  daily.     . hydrocortisone-pramoxine (PROCTOFOAM-HC) rectal foam Place 1 applicator rectally as needed for hemorrhoids or itching.    . hydrophilic ointment Apply topically.    . ipratropium (ATROVENT) 0.03 % nasal spray Place 1 spray into both nostrils every morning. 30 mL 5  . lisinopril (PRINIVIL,ZESTRIL) 20 MG tablet Take 10 mg by mouth daily.     . metoprolol (TOPROL-XL) 50 MG 24 hr tablet Take 50 mg by mouth 2 (two) times a day.     . Miconazole Nitrate 2 % KIT Apply topically.    . montelukast (SINGULAIR) 10 MG tablet Take 10 mg by mouth at bedtime.    . naproxen (NAPROSYN) 375 MG tablet Take 375 mg by mouth 2 (two) times daily with a meal.    . nitroGLYCERIN (NITROSTAT) 0.4 MG SL tablet Place 0.4 mg under the tongue every 5 (five) minutes as needed. For chest pain     . omeprazole (PRILOSEC) 20 MG capsule Take 20 mg by mouth 2 (two) times daily before a meal.    . pantoprazole (PROTONIX) 40 MG tablet Take 1 tablet (40 mg total) by mouth daily. 90 tablet 1  . pantoprazole (PROTONIX) 40 MG tablet Take 1 tablet (40 mg total) by mouth daily. 90 tablet 1  . polyethylene glycol (MIRALAX / GLYCOLAX) packet Take 17 g by mouth daily.      .   potassium chloride (K-DUR) 10 MEQ tablet Take 10 mEq by mouth daily.    . rosuvastatin (CRESTOR) 40 MG tablet Take 40 mg by mouth daily.     . Skin Protectants, Misc. [facility]) OINT Apply topically.    . terbinafine (LAMISIL) 1 % cream Apply topically daily.     Marland Kitchen triamcinolone cream (KENALOG) 0.5 % Apply 1 application topically as needed.      No current facility-administered medications on file prior to visit.    No Known Allergies  Objective: Physical Exam  General: Samuel Wilkerson is a pleasant 86 y.o. African American male, WD, WN in NAD. AAO x 3.   Vascular:  Neurovascular status unchanged b/l lower extremities. Capillary fill time to digits <3 seconds b/l lower extremities. Palpable DP pulses b/l. Nonpalpable PT pulse(s) b/l lower  extremities. Pedal hair present b/l. Skin temperature gradient within normal limits b/l. No pain with calf compression b/l. No edema noted b/l.  Dermatological:  Pedal skin is thin shiny, atrophic bilaterally. No open wounds bilaterally. No interdigital macerations bilaterally. Toenails 1-5 b/l elongated, discolored, dystrophic, thickened, crumbly with subungual debris and tenderness to dorsal palpation. Hyperkeratotic lesion(s) R 5th toe and submet head 3 left foot.  No erythema, no edema, no drainage, no flocculence.  Musculoskeletal:  Normal muscle strength 5/5 to all lower extremity muscle groups bilaterally. Wearing sneakers with great toe cut out on right shoe. No pain crepitus or joint limitation noted with ROM b/l. Hammertoes noted to the 1-5 bilaterally. Patient ambulates independent of any assistive aids.  Neurological:  Protective sensation intact 5/5 intact bilaterally with 10g monofilament b/l. Vibratory sensation intact b/l. Proprioception intact bilaterally.  Assessment and Plan:  1. Pain due to onychomycosis of toenails of both feet   2. Corns and callosities   3. PAD (peripheral artery disease) (HCC)    -Examined patient. -Toenails 1-5 b/l were debrided in length and girth with sterile nail nippers and dremel without iatrogenic bleeding.  -Corn(s) R 5th toe and callus(es) submet head 3 left foot were pared utilizing sterile scalpel blade without incident. Total number debrided =2. -Patient to report any pedal injuries to medical professional immediately. Recommended [employer]cher shoes with stretchable uppers. -Patient/POA to call should there be question/concern in the interim.  Return in about 9 weeks (around 02/11/2020) for nail and callus trim.  Marzetta Board, DPM

## 2019-12-18 ENCOUNTER — Telehealth: Payer: Self-pay | Admitting: Podiatry

## 2019-12-18 NOTE — Telephone Encounter (Signed)
Please advise 

## 2019-12-18 NOTE — Telephone Encounter (Signed)
Pt is requesting an rx for miconazole nitrate 2% top cream for fungal infection.  Was prescribed orignally by [place].  [employer] [phone]

## 2019-12-19 MED ORDER — MICONAZOLE NITRATE 2 % EX CREA: 1.0000 "application " | TOPICAL_CREAM | Freq: Two times a day (BID) | CUTANEOUS | 1 refills | Status: AC

## 2019-12-19 NOTE — Telephone Encounter (Signed)
Orders sent to the [facility] pharmacy.

## 2019-12-19 NOTE — Telephone Encounter (Signed)
That is fine Crystal.  Thanks and have a blessed day! Dr. Reece Agar.

## 2019-12-19 NOTE — Addendum Note (Signed)
Addended by: Alphia Kava D on: 12/19/2019 01:35 PM   Modules accepted: Orders

## 2020-02-12 ENCOUNTER — Ambulatory Visit: Payer: Medicare PPO | Admitting: Podiatry

## 2020-04-17 ENCOUNTER — Ambulatory Visit (INDEPENDENT_AMBULATORY_CARE_PROVIDER_SITE_OTHER): Payer: Medicare PPO | Admitting: Podiatry

## 2020-04-17 ENCOUNTER — Other Ambulatory Visit: Payer: Self-pay

## 2020-04-17 ENCOUNTER — Encounter: Payer: Self-pay | Admitting: Podiatry

## 2020-04-17 DIAGNOSIS — B351 Tinea unguium: Secondary | ICD-10-CM | POA: Diagnosis not present

## 2020-04-17 DIAGNOSIS — I739 Peripheral vascular disease, unspecified: Secondary | ICD-10-CM | POA: Diagnosis not present

## 2020-04-17 DIAGNOSIS — M79674 Pain in right toe(s): Secondary | ICD-10-CM | POA: Diagnosis not present

## 2020-04-17 DIAGNOSIS — M79675 Pain in left toe(s): Secondary | ICD-10-CM

## 2020-04-17 NOTE — Progress Notes (Signed)
This patient returns to my office for at risk foot care.  This patient requires this care by a professional since this patient will be at risk due to having  PAD.  This patient is unable to cut nails himself since the patient cannot reach his nails.These nails are painful walking and wearing shoes.  This patient presents for at risk foot care today.  General Appearance  Alert, conversant and in no acute stress.  Vascular  Dorsalis pedis and posterior tibial  pulses are weakly  palpable  bilaterally.  Capillary return is within normal limits  bilaterally. Temperature is within normal limits  bilaterally.  Neurologic  Senn-Weinstein monofilament wire test within normal limits  bilaterally. Muscle power within normal limits bilaterally.  Nails Thick disfigured discolored nails with subungual debris  from hallux to fifth toes bilaterally. No evidence of bacterial infection or drainage bilaterally.  Orthopedic  No limitations of motion  feet .  No crepitus or effusions noted.  No bony pathology or digital deformities noted.  Skin  normotropic skin with no porokeratosis noted bilaterally.  No signs of infections or ulcers noted.     Onychomycosis  Pain in right toes  Pain in left toes  Consent was obtained for treatment procedures.   Mechanical debridement of nails 1-5  bilaterally performed with a nail nipper.  Filed with dremel without incident.    Return office visit   10 weeks                   Told patient to return for periodic foot care and evaluation due to potential at risk complications.   Agueda Houpt DPM  

## 2020-04-28 ENCOUNTER — Ambulatory Visit: Payer: No Typology Code available for payment source | Admitting: Podiatry

## 2020-05-08 ENCOUNTER — Encounter: Payer: Self-pay | Admitting: Allergy & Immunology

## 2020-05-08 ENCOUNTER — Ambulatory Visit (INDEPENDENT_AMBULATORY_CARE_PROVIDER_SITE_OTHER): Payer: No Typology Code available for payment source | Admitting: Allergy & Immunology

## 2020-05-08 ENCOUNTER — Other Ambulatory Visit: Payer: Self-pay

## 2020-05-08 DIAGNOSIS — J310 Chronic rhinitis: Secondary | ICD-10-CM

## 2020-05-08 MED ORDER — IPRATROPIUM BROMIDE 0.03 % NA SOLN: 1.0000 | Freq: Every morning | NASAL | 5 refills | Status: DC

## 2020-05-08 MED ORDER — FLUTICASONE PROPIONATE 50 MCG/ACT NA SUSP
1.0000 | Freq: Every day | NASAL | 5 refills | Status: DC
Start: 2020-05-08 — End: 2022-05-04

## 2020-05-08 NOTE — Patient Instructions (Addendum)
1. Perennial and seasonal allergic rhinitis (dust mites, dog, cockroach, indoor and outdoor molds, trees, ragweed, and weeds) - Continue with all of your allergy medications.  - It seems that you are doing a good job with your symptoms now.  - Continue with fluticasone EVERY DAY for the best effects. - Continue with nasal ipratropium one spray per nostril EVERY MORNING to help dry out of your nose and prevent the postnasal drip.  - Continue with cetirizine 10mg  EVERY DAY.    2. GERD - Continue with pantoprazole 40mg  daily.   3. Return in about 1 year (around 05/08/2021).    Please inform of any Emergency Department visits, hospitalizations, or changes in symptoms. Call 05/10/2021 before going to the ED for breathing or allergy symptoms since we might be able to fit you in for a sick visit. Feel free to contact us anytime with any questions, problems, or concerns.  It was a pleasure to see you again today!  Websites that have reliable patient information: 1. American Academy of Asthma, Allergy, and Immunology: www.aaaai.org 2. Food Allergy Research and Education (FARE): foodallergy.org 3. Mothers of Asthmatics: http://www.asthmacommunitynetwork.org 4. American College of Allergy, Asthma, and Immunology: www.acaai.org   COVID-19 Vaccine Information can be found at: [place] For questions related to vaccine distribution or appointments, please email [email or web address] or call [phone].     "Like" [email or web address] on Facebook and Instagram for our latest updates!     HAPPY FALL!     Make sure you are registered to vote! If you have moved or changed any of your contact information, you will need to get this updated before voting!  In some cases, you MAY be able to register to vote online: [phone]
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
## 2020-07-24 ENCOUNTER — Encounter: Payer: Self-pay | Admitting: Podiatry

## 2020-07-24 ENCOUNTER — Ambulatory Visit (INDEPENDENT_AMBULATORY_CARE_PROVIDER_SITE_OTHER): Payer: No Typology Code available for payment source | Admitting: Podiatry

## 2020-07-24 ENCOUNTER — Other Ambulatory Visit: Payer: Self-pay

## 2020-07-24 DIAGNOSIS — M79675 Pain in left toe(s): Secondary | ICD-10-CM | POA: Diagnosis not present

## 2020-07-24 DIAGNOSIS — M79674 Pain in right toe(s): Secondary | ICD-10-CM

## 2020-07-24 DIAGNOSIS — I739 Peripheral vascular disease, unspecified: Secondary | ICD-10-CM | POA: Diagnosis not present

## 2020-07-24 DIAGNOSIS — B351 Tinea unguium: Secondary | ICD-10-CM | POA: Diagnosis not present

## 2020-07-24 NOTE — Progress Notes (Signed)
This patient returns to my office for at risk foot care.  This patient requires this care by a professional since this patient will be at risk due to having  PAD.  This patient is unable to cut nails himself since the patient cannot reach his nails.These nails are painful walking and wearing shoes.  This patient presents for at risk foot care today.  General Appearance  Alert, conversant and in no acute stress.  Vascular  Dorsalis pedis and posterior tibial  pulses are weakly  palpable  bilaterally.  Capillary return is within normal limits  bilaterally. Temperature is within normal limits  bilaterally.  Neurologic  Senn-Weinstein monofilament wire test within normal limits  bilaterally. Muscle power within normal limits bilaterally.  Nails Thick disfigured discolored nails with subungual debris  from hallux to fifth toes bilaterally. No evidence of bacterial infection or drainage bilaterally.  Orthopedic  No limitations of motion  feet .  No crepitus or effusions noted.  No bony pathology or digital deformities noted.  Skin  normotropic skin with no porokeratosis noted bilaterally.  No signs of infections or ulcers noted.     Onychomycosis  Pain in right toes  Pain in left toes  Consent was obtained for treatment procedures.   Mechanical debridement of nails 1-5  bilaterally performed with a nail nipper.  Filed with dremel without incident.    Return office visit   10 weeks                   Told patient to return for periodic foot care and evaluation due to potential at risk complications.   Helane Gunther DPM

## 2020-10-05 ENCOUNTER — Other Ambulatory Visit: Payer: Self-pay

## 2020-10-05 ENCOUNTER — Ambulatory Visit (INDEPENDENT_AMBULATORY_CARE_PROVIDER_SITE_OTHER): Payer: Medicare PPO | Admitting: Podiatry

## 2020-10-05 DIAGNOSIS — I739 Peripheral vascular disease, unspecified: Secondary | ICD-10-CM | POA: Diagnosis not present

## 2020-10-05 DIAGNOSIS — M2042 Other hammer toe(s) (acquired), left foot: Secondary | ICD-10-CM

## 2020-10-05 DIAGNOSIS — L84 Corns and callosities: Secondary | ICD-10-CM | POA: Diagnosis not present

## 2020-10-06 ENCOUNTER — Encounter: Payer: Self-pay | Admitting: Podiatry

## 2020-10-06 NOTE — Progress Notes (Signed)
  Subjective:  Patient ID: Samuel Wilkerson, male    DOB: 01-16-1931,  MRN: [id number]  Chief Complaint  Patient presents with  . Toe Pain    Left foot 2nd toe pain. PT has like a callus on the tip of the toe, he stated that his toe was swollen and painful    87 y.o. male presents with the above complaint. History confirmed with patient.   Objective:  Physical Exam: warm, good capillary refill, no trophic changes or ulcerative lesions, nonpalpable DP and PT pulses and normal sensory exam. Left Foot: Hyperkeratotic callus second tip of the toe with hammertoe that is semirigid in nature, mild edema in the toe, no signs of purulence or infection or cellulitis Assessment:   1. Hammertoe of left foot   2. Corns and callosities   3. PAD (peripheral artery disease) (HCC)      Plan:  Patient was evaluated and treated and all questions answered. . Discussed etiology and treatment options for hammertoe deformities and why this is causing the callus on the tip of his toe.  I debrided the lesion and applied silicone pads to offload this.  Do not think flexor tenotomy would alleviate this lesion very well because of the semirigid nature, if continues to be symptomatic would require arthroplasty of the toe to straighten the digit.  Return to see me as needed if the toe continues to be an issue  Return if symptoms worsen or fail to improve.

## 2020-10-09 ENCOUNTER — Encounter: Payer: Self-pay | Admitting: Podiatry

## 2020-10-09 ENCOUNTER — Ambulatory Visit (INDEPENDENT_AMBULATORY_CARE_PROVIDER_SITE_OTHER): Payer: No Typology Code available for payment source | Admitting: Podiatry

## 2020-10-09 ENCOUNTER — Other Ambulatory Visit: Payer: Self-pay

## 2020-10-09 DIAGNOSIS — M799 Soft tissue disorder, unspecified: Secondary | ICD-10-CM | POA: Diagnosis not present

## 2020-10-09 DIAGNOSIS — M79674 Pain in right toe(s): Secondary | ICD-10-CM

## 2020-10-09 DIAGNOSIS — I739 Peripheral vascular disease, unspecified: Secondary | ICD-10-CM

## 2020-10-09 DIAGNOSIS — M79675 Pain in left toe(s): Secondary | ICD-10-CM | POA: Diagnosis not present

## 2020-10-09 DIAGNOSIS — B351 Tinea unguium: Secondary | ICD-10-CM

## 2020-10-09 NOTE — Progress Notes (Signed)
This patient returns to my office for at risk foot care.  This patient requires this care by a professional since this patient will be at risk due to having  PAD.  This patient is unable to cut nails himself since the patient cannot reach his nails.These nails are painful walking and wearing shoes.  This patient presents for at risk foot care today.  General Appearance  Alert, conversant and in no acute stress.  Vascular  Dorsalis pedis and posterior tibial  pulses are weakly  palpable  bilaterally.  Capillary return is within normal limits  bilaterally. Temperature is within normal limits  bilaterally.  Neurologic  Senn-Weinstein monofilament wire test within normal limits  bilaterally. Muscle power within normal limits bilaterally.  Nails Thick disfigured discolored nails with subungual debris  from hallux to fifth toes bilaterally. No evidence of bacterial infection or drainage bilaterally.  Orthopedic  No limitations of motion  feet .  No crepitus or effusions noted.  Hammer toes  B/L.  Soft Tissue Mass dorsum 4,5 metabase right foot.  Asymptomatic.  Skin  normotropic skin with no porokeratosis noted bilaterally.  No signs of infections or ulcers noted.     Onychomycosis  Pain in right toes  Pain in left toes  Consent was obtained for treatment procedures.   Mechanical debridement of nails 1-5  bilaterally performed with a nail nipper.  Filed with dremel without incident. Discussed USTM right foot.   Return office visit   10 weeks                   Told patient to return for periodic foot care and evaluation due to potential at risk complications.   Helane Gunther DPM
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
## 2020-11-16 ENCOUNTER — Ambulatory Visit (INDEPENDENT_AMBULATORY_CARE_PROVIDER_SITE_OTHER): Payer: Medicare PPO | Admitting: Family Medicine

## 2020-11-16 ENCOUNTER — Encounter: Payer: Self-pay | Admitting: Family Medicine

## 2020-11-16 VITALS — BP 132/80 | HR 66 | Ht 76.0 in | Wt 193.0 lb

## 2020-11-16 DIAGNOSIS — E782 Mixed hyperlipidemia: Secondary | ICD-10-CM

## 2020-11-16 DIAGNOSIS — I2510 Atherosclerotic heart disease of native coronary artery without angina pectoris: Secondary | ICD-10-CM | POA: Diagnosis not present

## 2020-11-16 DIAGNOSIS — I10 Essential (primary) hypertension: Secondary | ICD-10-CM | POA: Diagnosis not present

## 2020-11-16 NOTE — Patient Instructions (Signed)

## 2020-11-16 NOTE — Addendum Note (Signed)
Addended by: Lesle Chris on: 11/16/2020 09:35 AM   Modules accepted: Orders

## 2020-12-04 ENCOUNTER — Ambulatory Visit (INDEPENDENT_AMBULATORY_CARE_PROVIDER_SITE_OTHER): Payer: No Typology Code available for payment source | Admitting: Podiatry

## 2020-12-04 ENCOUNTER — Encounter: Payer: Self-pay | Admitting: Podiatry

## 2020-12-04 ENCOUNTER — Other Ambulatory Visit: Payer: Self-pay

## 2020-12-04 DIAGNOSIS — M2042 Other hammer toe(s) (acquired), left foot: Secondary | ICD-10-CM | POA: Diagnosis not present

## 2020-12-04 DIAGNOSIS — L84 Corns and callosities: Secondary | ICD-10-CM

## 2020-12-04 NOTE — Progress Notes (Signed)
This patient returns to my office for at risk foot care.  This patient requires this care by a professional since this patient will be at risk due to having  PAD.  This patient is having throbbing pain in his second toe left foot.  He says the throbbing has been present for 2 weeks.  He has applied neosporin and fungal medication.  He says the toe is better today.  This patient presents for at risk foot care today.  General Appearance  Alert, conversant and in no acute stress.  Vascular  Dorsalis pedis and posterior tibial  pulses are weakly  palpable  bilaterally.  Capillary return is within normal limits  bilaterally. Temperature is within normal limits  bilaterally.  Neurologic  Senn-Weinstein monofilament wire test within normal limits  bilaterally. Muscle power within normal limits bilaterally.  Nails Thick disfigured discolored nails with subungual debris  from hallux to fifth toes bilaterally. No evidence of bacterial infection or drainage bilaterally.  Orthopedic  No limitations of motion  feet .  No crepitus or effusions noted.  Hammer toes  B/L.  Soft Tissue Mass dorsum 4,5 metabase right foot.  Asymptomatic.  Skin  normotropic skin with no porokeratosis noted bilaterally.  No signs of infections or ulcers noted.   Clavi second toe left foot.  Clavi second toe left foot.  Consent was obtained for treatment procedures.   Debrided clavi second toe left foot with # 15 blade.  Filed with dremel without incident.  Padding dispensed .   Return office visit   9 weeks                   Told patient to return for periodic foot care and evaluation due to potential at risk complications.   Helane Gunther DPM

## 2020-12-08 ENCOUNTER — Telehealth: Payer: Self-pay | Admitting: Podiatry

## 2020-12-08 NOTE — Telephone Encounter (Signed)
Patient called about extending his [facility] Referral. I went ahead and faxed over the community care extension along with his last office note today.

## 2020-12-31 ENCOUNTER — Telehealth: Payer: Self-pay | Admitting: Allergy & Immunology

## 2020-12-31 NOTE — Telephone Encounter (Signed)
New request for service faxed to [facility], [phone].

## 2021-01-08 ENCOUNTER — Other Ambulatory Visit: Payer: Self-pay

## 2021-01-08 ENCOUNTER — Ambulatory Visit (INDEPENDENT_AMBULATORY_CARE_PROVIDER_SITE_OTHER): Payer: No Typology Code available for payment source | Admitting: Podiatry

## 2021-01-08 ENCOUNTER — Encounter: Payer: Self-pay | Admitting: Podiatry

## 2021-01-08 DIAGNOSIS — M79674 Pain in right toe(s): Secondary | ICD-10-CM | POA: Diagnosis not present

## 2021-01-08 DIAGNOSIS — M79675 Pain in left toe(s): Secondary | ICD-10-CM

## 2021-01-08 DIAGNOSIS — R609 Edema, unspecified: Secondary | ICD-10-CM | POA: Insufficient documentation

## 2021-01-08 DIAGNOSIS — L501 Idiopathic urticaria: Secondary | ICD-10-CM | POA: Insufficient documentation

## 2021-01-08 DIAGNOSIS — F419 Anxiety disorder, unspecified: Secondary | ICD-10-CM | POA: Insufficient documentation

## 2021-01-08 DIAGNOSIS — Q846 Other congenital malformations of nails: Secondary | ICD-10-CM | POA: Insufficient documentation

## 2021-01-08 DIAGNOSIS — B351 Tinea unguium: Secondary | ICD-10-CM | POA: Diagnosis not present

## 2021-01-08 DIAGNOSIS — Z23 Encounter for immunization: Secondary | ICD-10-CM | POA: Insufficient documentation

## 2021-01-08 DIAGNOSIS — H5015 Alternating exotropia: Secondary | ICD-10-CM | POA: Insufficient documentation

## 2021-01-08 DIAGNOSIS — M545 Low back pain, unspecified: Secondary | ICD-10-CM | POA: Insufficient documentation

## 2021-01-08 DIAGNOSIS — L853 Xerosis cutis: Secondary | ICD-10-CM | POA: Insufficient documentation

## 2021-01-08 DIAGNOSIS — H903 Sensorineural hearing loss, bilateral: Secondary | ICD-10-CM | POA: Insufficient documentation

## 2021-01-08 DIAGNOSIS — Z461 Encounter for fitting and adjustment of hearing aid: Secondary | ICD-10-CM | POA: Insufficient documentation

## 2021-01-08 DIAGNOSIS — R6884 Jaw pain: Secondary | ICD-10-CM | POA: Insufficient documentation

## 2021-01-08 DIAGNOSIS — Z711 Person with feared health complaint in whom no diagnosis is made: Secondary | ICD-10-CM | POA: Insufficient documentation

## 2021-01-08 DIAGNOSIS — Z961 Presence of intraocular lens: Secondary | ICD-10-CM | POA: Insufficient documentation

## 2021-01-08 DIAGNOSIS — H401290 Low-tension glaucoma, unspecified eye, stage unspecified: Secondary | ICD-10-CM | POA: Insufficient documentation

## 2021-01-08 DIAGNOSIS — H524 Presbyopia: Secondary | ICD-10-CM | POA: Insufficient documentation

## 2021-01-08 DIAGNOSIS — K635 Polyp of colon: Secondary | ICD-10-CM | POA: Insufficient documentation

## 2021-01-08 DIAGNOSIS — I739 Peripheral vascular disease, unspecified: Secondary | ICD-10-CM | POA: Diagnosis not present

## 2021-01-08 DIAGNOSIS — G501 Atypical facial pain: Secondary | ICD-10-CM | POA: Insufficient documentation

## 2021-01-08 DIAGNOSIS — R220 Localized swelling, mass and lump, head: Secondary | ICD-10-CM | POA: Insufficient documentation

## 2021-01-08 NOTE — Progress Notes (Signed)
This patient returns to my office for at risk foot care.  This patient requires this care by a professional since this patient will be at risk due to having  PAD.  This patient is unable to cut nails himself since the patient cannot reach his nails.These nails are painful walking and wearing shoes.  This patient presents for at risk foot care today.  General Appearance  Alert, conversant and in no acute stress.  Vascular  Dorsalis pedis and posterior tibial  pulses are weakly  palpable  bilaterally.  Capillary return is within normal limits  bilaterally. Temperature is within normal limits  bilaterally.  Neurologic  Senn-Weinstein monofilament wire test within normal limits  bilaterally. Muscle power within normal limits bilaterally.  Nails Thick disfigured discolored nails with subungual debris  from hallux to fifth toes bilaterally. No evidence of bacterial infection or drainage bilaterally.  Orthopedic  No limitations of motion  feet .  No crepitus or effusions noted.  Hammer toes  B/L.  Soft Tissue Mass dorsum 4,5 metabase right foot.  Asymptomatic.  Skin  normotropic skin with no porokeratosis noted bilaterally.  No signs of infections or ulcers noted.     Onychomycosis  Pain in right toes  Pain in left toes  Consent was obtained for treatment procedures.   Mechanical debridement of nails 1-5  bilaterally performed with a nail nipper.  Filed with dremel without incident. Discussed USTM right foot.   Return office visit   9  weeks                   Told patient to return for periodic foot care and evaluation due to potential at risk complications.   Helane Gunther DPM

## 2021-01-13 NOTE — Telephone Encounter (Signed)
Refaxed request to [facility] with cover sheet explaining 2nd attempt and that patient has an appointment on 10/28, but we would like to have a referral on file in case of sick visits.

## 2021-01-22 NOTE — Telephone Encounter (Signed)
Left voicemail for Bonita Quin who assists with authorizations, [phone] to check on the status of new [facility] authorization.

## 2021-01-25 NOTE — Telephone Encounter (Signed)
Spoke with Bonita Quin from [facility] and she states it is the patient's responsibility to get a new authorization. Patient has to reach out to his provider at the [facility] location. Bonita Quin states she has patient on her other line and will tell him he needs to contact them. Bonita Quin states to reach out closer to his appointment in October if we have not received a new authorization.

## 2021-02-10 ENCOUNTER — Ambulatory Visit: Payer: No Typology Code available for payment source | Admitting: Podiatry

## 2021-02-14 ENCOUNTER — Other Ambulatory Visit: Payer: Self-pay | Admitting: Allergy & Immunology

## 2021-03-12 ENCOUNTER — Ambulatory Visit
Admission: EM | Admit: 2021-03-12 | Discharge: 2021-03-12 | Disposition: A | Discharge status: Home / Self Care | Payer: Medicare PPO | Attending: Emergency Medicine | Admitting: Emergency Medicine

## 2021-03-12 DIAGNOSIS — R220 Localized swelling, mass and lump, head: Secondary | ICD-10-CM

## 2021-03-12 MED ORDER — AMOXICILLIN 500 MG PO CAPS: 500.0000 mg | ORAL_CAPSULE | Freq: Three times a day (TID) | ORAL | 0 refills | Status: DC

## 2021-03-12 NOTE — ED Triage Notes (Signed)
Patient presents to Urgent Care with complaints of left sided facial swelling x 6 months. He states he has been to [place]. He states he has had several MRI with no abnormal findings. He was given clindamycin for his swelling with no improvement. He states in the past he treats swelling with amoxicillin.   Denies fever.

## 2021-03-12 NOTE — ED Provider Notes (Signed)
[facility]   [id number] 03/12/21 Arrival Time: 5400  CC: LT facial swelling  SUBJECTIVE: Patient is a poor historian RAYSHARD SCHIRTZINGER is a 87 y.o. male who presents with LT facial swelling x 6 months, intermittently.  Swelling reoccurred earlier this week and was prescribed clindamycin without relief.  Reports similar symptoms in the past that improved with amoxicillin.   Denies dental, ear or throat pain.  Denies precipitating event or trauma.  Denies fever, chills, dysphagia, odynophagia, oral or neck swelling, nausea, vomiting, chest pain, SOB.    ROS: As per HPI.  All other pertinent ROS negative.     Past Medical History:  Diagnosis Date   Coronary artery disease    Hyperlipidemia    Hypertension    Past Surgical History:  Procedure Laterality Date   CARDIAC CATHETERIZATION     colonoscopy     CORONARY ANGIOPLASTY     TONSILLECTOMY     No Known Allergies No current facility-administered medications on file prior to encounter.   Current Outpatient Medications on File Prior to Encounter  Medication Sig Dispense Refill   albuterol (PROVENTIL HFA;VENTOLIN HFA) 108 (90 Base) MCG/ACT inhaler Inhale 1-2 puffs into the lungs every 6 (six) hours as needed for wheezing or shortness of breath. 1 Inhaler 0   amLODipine (NORVASC) 10 MG tablet Take 5 mg by mouth daily.     aspirin EC 81 MG tablet Take 81 mg by mouth daily.     azithromycin 250 mg in dextrose 5 % 125 mL Place 1 drop into both eyes nightly.     cetirizine (ZYRTEC) 10 MG tablet Take 1 tablet (10 mg total) by mouth daily. 90 tablet 1   chlordiazePOXIDE (LIBRIUM) 10 MG capsule Take 20 mg by mouth at bedtime.     Cholecalciferol (VITAMIN D3) 25 MCG (1000 UT) CAPS Take 1,000 Units by mouth daily.      clobetasol cream (TEMOVATE) 0.05 % Apply topically.     diphenhydrAMINE (BENADRYL) 25 mg capsule as needed.     dorzolamide (TRUSOPT) 2 % ophthalmic solution Place 1 drop into both eyes 2 (two) times daily.      dorzolamide (TRUSOPT) 2 % ophthalmic solution Apply to eye.     fluticasone (FLONASE) 50 MCG/ACT nasal spray Place 1 spray into both nostrils daily. 16 g 5   guaiFENesin 200 MG tablet Take 200 mg by mouth 2 (two) times a day.     hydrochlorothiazide (HYDRODIURIL) 50 MG tablet Take 50 mg by mouth daily.     hydrocortisone-pramoxine (PROCTOFOAM-HC) rectal foam Place 1 applicator rectally as needed for hemorrhoids or itching.     hydrophilic ointment Apply topically.     ipratropium (ATROVENT) 0.03 % nasal spray Place 1 spray into both nostrils every morning. 30 mL 5   latanoprost (XALATAN) 0.005 % ophthalmic solution Apply 1 drop to eye at bedtime.     lisinopril (PRINIVIL,ZESTRIL) 20 MG tablet Take 10 mg by mouth daily.     Menthol-Methyl Salicylate (THERA-GESIC) 0.5-15 % CREA Apply topically.     metoprolol (TOPROL-XL) 50 MG 24 hr tablet Take 50 mg by mouth 2 (two) times a day.     metoprolol tartrate (LOPRESSOR) 50 MG tablet Take by mouth.     miconazole (MICOTIN) 2 % cream Apply 1 application topically 2 (two) times daily. 35 g 1   Miconazole Nitrate 2 % KIT Apply topically.     MISC NATURAL PRODUCTS EX Apply topically. Pain relieving cream - as needed for back  montelukast (SINGULAIR) 10 MG tablet Take 10 mg by mouth at bedtime.     montelukast (SINGULAIR) 10 MG tablet Take 1 tablet by mouth daily.     naproxen (NAPROSYN) 375 MG tablet Take 375 mg by mouth 2 (two) times daily with a meal.     nitroGLYCERIN (NITROSTAT) 0.4 MG SL tablet Place 0.4 mg under the tongue every 5 (five) minutes as needed. For chest pain     pantoprazole (PROTONIX) 40 MG tablet TAKE 1 TABLET(40 MG) BY MOUTH DAILY 90 tablet 0   phenylephrine (,USE FOR PREPARATION-H,) 0.25 % suppository Place rectally.     polyethylene glycol (MIRALAX / GLYCOLAX) packet Take 17 g by mouth daily.     polyethylene glycol powder (GLYCOLAX/MIRALAX) 17 GM/SCOOP powder Take by mouth.     potassium chloride (K-DUR) 10 MEQ tablet Take 10  mEq by mouth daily.     potassium chloride (KLOR-CON) 10 MEQ tablet TAKE ONE TABLET BY MOUTH EVERY DAY FOR POTASSIUM REPLACEMENT     rosuvastatin (CRESTOR) 40 MG tablet Take 40 mg by mouth daily.     Skin Protectants, Misc. [facility]) OINT Apply topically.     terbinafine (LAMISIL) 1 % cream Apply topically daily.      triamcinolone cream (KENALOG) 0.5 % Apply 1 application topically as needed.      Social History   Socioeconomic History   Marital status: Widowed    Spouse name: Not on file   Number of children: 1   Years of education: Not on file   Highest education level: Not on file  Occupational History   Occupation: Retired  Tobacco Use   Smoking status: Never   Smokeless tobacco: Never  Vaping Use   Vaping Use: Never used  Substance and Sexual Activity   Alcohol use: No    Alcohol/week: 0.0 standard drinks   Drug use: No   Sexual activity: Not on file  Other Topics Concern   Not on file  Social History Narrative   Not on file   Social Determinants of Health   Financial Resource Strain: Not on file  Food Insecurity: Not on file  Transportation Needs: Not on file  Physical Activity: Not on file  Stress: Not on file  Social Connections: Not on file  Intimate Partner Violence: Not on file   Family History  Problem Relation Age of Onset   Diabetes Sister        b/l below knee amputations   Stroke Brother    Heart attack Brother     OBJECTIVE:  Vitals:   03/12/21 0945  BP: (!) 157/79  Pulse: 65  Resp: 16  TempSrc: Oral  SpO2: 95%    General appearance: alert; no distress HENT: normocephalic; atraumatic; slight swelling over LT cheek, NTTP, no overlying erythema or induration; poor dentition, multiple miss teeth Neck: supple without LAD Lungs: normal respirations; CTAB CV: RRR Skin: warm and dry Psychological: alert and cooperative; normal mood and affect  ASSESSMENT & PLAN:  1. Facial swelling     Meds ordered this encounter  Medications    amoxicillin (AMOXIL) 500 MG capsule    Sig: Take 1 capsule (500 mg total) by mouth 3 (three) times daily.    Dispense:  21 capsule    Refill:  0    Order Specific Question:   Supervising Provider    Answer:   Raylene Everts [[id number]]   We will cover for possible dental infection with amoxicillin Amoxicillin prescribed Follow up with PCP for  further evaluation and management Return or go to the ED if you have any new or worsening symptoms such as fever, chills, difficulty swallowing, painful swallowing, oral or neck swelling, nausea, vomiting, chest pain, SOB, etc...  Reviewed expectations re: course of current medical issues. Questions answered. Outlined signs and symptoms indicating need for more acute intervention. Patient verbalized understanding. After Visit Summary given.    Lestine Box, PA-C 03/12/21 1005

## 2021-03-12 NOTE — Discharge Instructions (Addendum)
Amoxicillin prescribed Follow up with PCP for further evaluation and managment Return or go to the ED if you have any new or worsening symptoms such as fever, chills, difficulty swallowing, painful swallowing, oral or neck swelling, nausea, vomiting, chest pain, SOB, etc..Marland Kitchen

## 2021-03-19 ENCOUNTER — Encounter: Payer: Self-pay | Admitting: Podiatry

## 2021-03-19 ENCOUNTER — Other Ambulatory Visit: Payer: Self-pay

## 2021-03-19 ENCOUNTER — Ambulatory Visit (INDEPENDENT_AMBULATORY_CARE_PROVIDER_SITE_OTHER): Payer: No Typology Code available for payment source | Admitting: Podiatry

## 2021-03-19 DIAGNOSIS — B351 Tinea unguium: Secondary | ICD-10-CM

## 2021-03-19 DIAGNOSIS — I739 Peripheral vascular disease, unspecified: Secondary | ICD-10-CM

## 2021-03-19 DIAGNOSIS — M79675 Pain in left toe(s): Secondary | ICD-10-CM

## 2021-03-19 DIAGNOSIS — M79674 Pain in right toe(s): Secondary | ICD-10-CM | POA: Diagnosis not present

## 2021-03-19 DIAGNOSIS — M2042 Other hammer toe(s) (acquired), left foot: Secondary | ICD-10-CM

## 2021-03-19 NOTE — Progress Notes (Signed)
This patient returns to my office for at risk foot care.  This patient requires this care by a professional since this patient will be at risk due to having  PAD.  This patient is unable to cut nails himself since the patient cannot reach his nails.These nails are painful walking and wearing shoes.  This patient presents for at risk foot care today.  General Appearance  Alert, conversant and in no acute stress.  Vascular  Dorsalis pedis and posterior tibial  pulses are weakly  palpable  bilaterally.  Capillary return is within normal limits  bilaterally. Temperature is within normal limits  bilaterally.  Neurologic  Senn-Weinstein monofilament wire test within normal limits  bilaterally. Muscle power within normal limits bilaterally.  Nails Thick disfigured discolored nails with subungual debris  from hallux to fifth toes bilaterally. No evidence of bacterial infection or drainage bilaterally.  Orthopedic  No limitations of motion  feet .  No crepitus or effusions noted.  Hammer toes  B/L.  Soft Tissue Mass dorsum 4,5 metabase right foot.  Asymptomatic.  Skin  normotropic skin with no porokeratosis noted bilaterally.  No signs of infections or ulcers noted.     Onychomycosis  Pain in right toes  Pain in left toes  Consent was obtained for treatment procedures.   Mechanical debridement of nails 1-5  bilaterally performed with a nail nipper.  Filed with dremel without incident. Discussed USTM right foot.   Return office visit   10 weeks                   Told patient to return for periodic foot care and evaluation due to potential at risk complications.   Percy Winterrowd DPM  

## 2021-04-12 ENCOUNTER — Encounter: Payer: Self-pay | Admitting: *Deleted

## 2021-04-12 ENCOUNTER — Other Ambulatory Visit: Payer: Self-pay

## 2021-04-12 ENCOUNTER — Ambulatory Visit
Admission: EM | Admit: 2021-04-12 | Discharge: 2021-04-12 | Disposition: A | Discharge status: Home / Self Care | Payer: Medicare PPO | Attending: Family Medicine | Admitting: Family Medicine

## 2021-04-12 DIAGNOSIS — R6884 Jaw pain: Secondary | ICD-10-CM

## 2021-04-12 DIAGNOSIS — R220 Localized swelling, mass and lump, head: Secondary | ICD-10-CM | POA: Diagnosis not present

## 2021-04-12 MED ORDER — AMOXICILLIN 500 MG PO CAPS: 500.0000 mg | ORAL_CAPSULE | Freq: Three times a day (TID) | ORAL | 0 refills | Status: DC

## 2021-04-12 NOTE — ED Triage Notes (Signed)
Pt reports chronic jaw pain. Pt reports he has seen multiple DRs

## 2021-04-12 NOTE — ED Provider Notes (Signed)
[facility]    CSN: [id number] Arrival date & time: 04/12/21  1048      History   Chief Complaint Chief Complaint  Patient presents with   Jaw Pain    HPI Samuel Wilkerson is a 87 y.o. male.   Patient presenting today with acute on chronic left-sided jaw pain and swelling.  States this has been ongoing for many months now and he has seen numerous providers for this including his primary care provider, the [facility] and his dentist.  He is awaiting a different specialist referral at this time as numerous imaging modalities and lab tests have not turned up any cause of his issue.  He states the only thing that seems to help his courses of amoxicillin.  He recently got a round of amoxicillin at this urgent care and got some temporary relief from that.  Denies fever, chills, known dental infections, difficulty swallowing, headaches, chest pain, shortness of breath, nausea, vomiting.  Not currently taking anything at home for symptoms.   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   Anxiety 01/08/2021   Atypical facial pain 01/08/2021   Bilateral pseudophakia 01/08/2021   Colonic polyp 01/08/2021   Encounter for fitting and adjustment of hearing aid 01/08/2021   Encounter for immunization 01/08/2021   Idiopathic urticaria 01/08/2021   Jaw pain 01/08/2021   Lens replaced by other means 01/08/2021   Localized swelling, mass and lump, head 01/08/2021   Low back pain, unspecified 01/08/2021   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   Onychomycosis due to dermatophyte 01/08/2021   Person with feared health complaint in whom no diagnosis is made 01/08/2021   Presbyopia 01/08/2021   Sensorineural hearing loss, bilateral 01/08/2021   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   Swelling of body region 01/08/2021   Xerosis cutis  01/08/2021   Clavi 12/04/2020   Soft tissue lesion 10/09/2020   Hyperlipidemia 11/26/2018   CAD (coronary artery disease), native coronary artery 11/26/2018   Hypertension 11/26/2018   Seasonal and perennial allergic rhinitis 11/07/2018   Secondary cataract 05/22/2018   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 Authorizing Provider  albuterol (PROVENTIL HFA;VENTOLIN HFA) 108 (90 Base) MCG/ACT inhaler Inhale 1-2 puffs into the lungs every 6 (six) hours as needed for wheezing or shortness of breath. 07/10/16   Ward, Delice Bison, DO  amLODipine (NORVASC) 10 MG tablet Take 5 mg by mouth daily.    [provider]  amoxicillin (AMOXIL) 500 MG capsule Take 1 capsule (500 mg total) by mouth 3 (three) times daily. 04/12/21   Volney American, PA-C  aspirin EC 81 MG tablet Take 81 mg by mouth daily.    [provider]  azithromycin 250 mg in dextrose 5 % 125 mL Place 1 drop into both eyes nightly.    [provider]  cetirizine (ZYRTEC) 10 MG tablet Take 1 tablet (10 mg total) by mouth daily. 11/06/19   Valentina Shaggy, MD  chlordiazePOXIDE (LIBRIUM) 10 MG capsule Take 20 mg by  mouth at bedtime.    [provider]  Cholecalciferol (VITAMIN D3) 25 MCG (1000 UT) CAPS Take 1,000 Units by mouth daily.     [provider]  clobetasol cream (TEMOVATE) 0.05 % Apply topically. 09/02/20   [provider]  diphenhydrAMINE (BENADRYL) 25 mg capsule as needed. 11/03/20   [provider]  dorzolamide (TRUSOPT) 2 % ophthalmic solution Place 1 drop into both eyes 2 (two) times daily.    [provider]  dorzolamide (TRUSOPT) 2 % ophthalmic solution Apply to eye. 01/07/21   [provider]  fluticasone (FLONASE) 50 MCG/ACT nasal spray Place 1 spray into both nostrils daily. 05/08/20   Valentina Shaggy, MD  guaiFENesin 200 MG tablet Take 200 mg by mouth 2 (two) times a day.    [provider]  hydrochlorothiazide (HYDRODIURIL) 50 MG tablet Take 50 mg by mouth daily.    [provider]  hydrocortisone-pramoxine [facility]) rectal foam Place 1 applicator rectally as needed for hemorrhoids or itching.    [provider]  hydrophilic ointment Apply topically.    [provider]  ipratropium (ATROVENT) 0.03 % nasal spray Place 1 spray into both nostrils every morning. 05/08/20   Valentina Shaggy, MD  latanoprost (XALATAN) 0.005 % ophthalmic solution Apply 1 drop to eye at bedtime. 09/02/20   [provider]  lisinopril (PRINIVIL,ZESTRIL) 20 MG tablet Take 10 mg by mouth daily.    [provider]  Menthol-Methyl Salicylate (THERA-GESIC) 0.5-15 % CREA Apply topically. 09/02/20   [provider]  metoprolol (TOPROL-XL) 50 MG 24 hr tablet Take 50 mg by mouth 2 (two) times a day.    [provider]  metoprolol tartrate (LOPRESSOR) 50 MG tablet Take by mouth. 12/25/19   [provider]  miconazole (MICOTIN) 2 % cream Apply 1 application topically 2 (two) times daily. 12/19/19   Marzetta Board, DPM  Miconazole Nitrate 2 % KIT Apply topically.    [provider]  MISC NATURAL PRODUCTS EX Apply topically. Pain relieving cream - as needed for back    [provider]  montelukast (SINGULAIR) 10 MG tablet Take 10 mg by mouth at bedtime.    [provider]  montelukast (SINGULAIR) 10 MG tablet Take 1 tablet by mouth daily. 12/09/20   [provider]  naproxen (NAPROSYN) 375 MG tablet Take 375 mg by mouth 2 (two) times daily with a meal.    [provider]  nitroGLYCERIN (NITROSTAT) 0.4 MG SL tablet Place 0.4 mg under the tongue every 5 (five) minutes as needed.  For chest pain    [provider]  pantoprazole (PROTONIX) 40 MG tablet TAKE 1 TABLET(40 MG) BY MOUTH DAILY 02/15/21   Valentina Shaggy, MD  phenylephrine (,USE FOR PREPARATION-H,) 0.25 % suppository Place rectally. 05/05/20   [provider]  polyethylene glycol (MIRALAX / GLYCOLAX) packet Take 17 g by mouth daily.    [provider]  polyethylene glycol powder (GLYCOLAX/MIRALAX) 17 GM/SCOOP powder Take by mouth. 01/07/21   [provider]  potassium chloride (K-DUR) 10 MEQ tablet Take 10 mEq by mouth daily.    [provider]  potassium chloride (KLOR-CON) 10 MEQ tablet TAKE ONE TABLET BY MOUTH EVERY DAY FOR POTASSIUM REPLACEMENT 09/02/20   [provider]  rosuvastatin (CRESTOR) 40 MG tablet Take 40 mg by mouth daily.    [provider]  Skin Protectants, Misc. [facility]) OINT Apply topically.    [provider]  terbinafine (  LAMISIL) 1 % cream Apply topically daily.     [provider]  triamcinolone cream (KENALOG) 0.5 % Apply 1 application topically as needed.     [provider]    Family History Family History  Problem Relation Age of Onset   Diabetes Sister        b/l below knee amputations   Stroke Brother    Heart attack Brother     Social History Social History   Tobacco Use   Smoking status: Never   Smokeless tobacco: Never  Vaping Use   Vaping Use: Never used  Substance Use Topics   Alcohol use: No    Alcohol/week: 0.0 standard drinks   Drug use: No     Allergies   Patient has no known allergies.   Review of Systems Review of Systems Per HPI  Physical Exam Triage Vital Signs ED Triage Vitals  Enc Vitals Group     BP 04/12/21 1230 (!) 149/33     Pulse Rate 04/12/21 1230 68     Resp 04/12/21 1230 18     Temp 04/12/21 1230 97.8 F (36.6 C)     Temp src --      SpO2 04/12/21 1230 98 %     Weight --      Height --      Head Circumference --      Peak Flow --       Pain Score 04/12/21 1231 5     Pain Loc --      Pain Edu? --      Excl. in [place]? --    No data found.  Updated Vital Signs BP (!) 149/33   Pulse 68   Temp 97.8 F (36.6 C)   Resp 18   SpO2 98%   Visual Acuity Right Eye Distance:   Left Eye Distance:   Bilateral Distance:    Right Eye Near:   Left Eye Near:    Bilateral Near:     Physical Exam Vitals and nursing note reviewed.  Constitutional:      Appearance: Normal appearance.  HENT:     Head: Atraumatic.     Mouth/Throat:     Comments: Mild appreciable diffuse left facial swelling below eye extending to TMJ.  Minimally tender to palpation.  No crepitus with motion of the TMJ.  No appreciable dental infections Eyes:     Extraocular Movements: Extraocular movements intact.     Conjunctiva/sclera: Conjunctivae normal.  Cardiovascular:     Rate and Rhythm: Normal rate and regular rhythm.  Pulmonary:     Effort: Pulmonary effort is normal.     Breath sounds: Normal breath sounds.  Musculoskeletal:        General: Normal range of motion.     Cervical back: Normal range of motion and neck supple.  Skin:    General: Skin is warm and dry.  Neurological:     General: No focal deficit present.     Mental Status: He is oriented to person, place, and time.     Motor: No weakness.     Gait: Gait normal.  Psychiatric:        Mood and Affect: Mood normal.        Thought Content: Thought content normal.        Judgment: Judgment normal.     UC Treatments / Results  Labs (all labs ordered are listed, but only abnormal results are displayed) Labs Reviewed - No data to display  EKG   Radiology No results found.  Procedures Procedures (including critical care time)  Medications Ordered in UC Medications - No data to display  Initial Impression / Assessment and Plan / UC Course  I have reviewed the triage vital signs and the nursing notes.  Pertinent labs & imaging results that were available during my care  of the patient were reviewed by me and considered in my medical decision making (see chart for details).     Already being worked up by numerous specialist at this time with so far negative imaging and lab testing.  Possibly salivary stone issue but awaiting further work-up.  As he receives benefit from amoxicillin, will provide 1 more short course of this until he can get in with his new specialist for further work-up.  Over-the-counter pain relievers and symptomatic management reviewed.  Final Clinical Impressions(s) / UC Diagnoses   Final diagnoses:  Facial swelling  Jaw pain   Discharge Instructions   None    ED Prescriptions     Medication Sig Dispense Auth. Provider   amoxicillin (AMOXIL) 500 MG capsule Take 1 capsule (500 mg total) by mouth 3 (three) times daily. 21 capsule Volney American, [place]      PDMP not reviewed this encounter.   Volney American, [place] 04/12/21 1446

## 2021-04-23 ENCOUNTER — Ambulatory Visit (INDEPENDENT_AMBULATORY_CARE_PROVIDER_SITE_OTHER): Payer: No Typology Code available for payment source | Admitting: Allergy & Immunology

## 2021-04-23 ENCOUNTER — Encounter: Payer: Self-pay | Admitting: Allergy & Immunology

## 2021-04-23 ENCOUNTER — Other Ambulatory Visit: Payer: Self-pay

## 2021-04-23 VITALS — BP 122/70 | HR 81 | Temp 97.8°F | Resp 16 | Ht 76.0 in | Wt 195.0 lb

## 2021-04-23 DIAGNOSIS — J302 Other seasonal allergic rhinitis: Secondary | ICD-10-CM

## 2021-04-23 DIAGNOSIS — R220 Localized swelling, mass and lump, head: Secondary | ICD-10-CM | POA: Diagnosis not present

## 2021-04-23 DIAGNOSIS — J3089 Other allergic rhinitis: Secondary | ICD-10-CM | POA: Diagnosis not present

## 2021-04-23 DIAGNOSIS — K219 Gastro-esophageal reflux disease without esophagitis: Secondary | ICD-10-CM | POA: Diagnosis not present

## 2021-04-23 NOTE — Progress Notes (Signed)
FOLLOW UP  Date of Service/Encounter:  04/23/21   Assessment:   Recurrent pharyngitis - ? GERD   Perennial and seasonal allergic rhinitis (dust mites, dog, cockroach, indoor and outdoor molds, trees, ragweed, and weeds) - well controlled   Facial swelling - unknown etiology but reportedly normal imaging and labs (getting outside records before doing further work up)    I am confused about the facial swelling. I do not have the labs or imaging in front of me, so I cannot confirm everything that he is saying. He does have several bottles of amoxicillin and he tends to just take those for a few days when the swelling happens and it improves per the patient. This is no way to treat angioedema, however. I will likely order some additional labs to look for HAE or acquired angioedema, but I do not want to repeat labs that have already been done. I recommended instead that he do cetirizine twice daily instead of doing these willy nilly antibiotics.     Plan/Recommendations:   1. Perennial and seasonal allergic rhinitis (dust mites, dog, cockroach, indoor and outdoor molds, trees, ragweed, and weeds) - Continue with all of your allergy medications.  - It seems that you are doing a good job with your symptoms now.  - Continue with fluticasone EVERY DAY for the best effects. - Continue with nasal ipratropium one spray per nostril EVERY MORNING to help dry out of your nose and prevent the postnasal drip.  - Continue with cetirizine 10mg  EVERY DAY.    2. GERD - Continue with pantoprazole 40mg  daily.   3. Facial swelling - I might want to get some lab work but I want to see wheat they did first. - I also want to get a copy of the imaging. - Chronic antibiotics are not a way to treat facial swelling long term.  4. Return in about 3 months (around 07/24/2021).    Subjective:   Samuel Wilkerson is a 87 y.o. 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   Anxiety 01/08/2021   Atypical facial pain 01/08/2021   Bilateral pseudophakia 01/08/2021   Colonic polyp 01/08/2021   Encounter for fitting and adjustment of hearing aid 01/08/2021   Encounter for immunization 01/08/2021   Idiopathic urticaria 01/08/2021   Jaw pain 01/08/2021   Lens replaced by other means 01/08/2021   Localized swelling, mass and lump, head 01/08/2021   Low back pain, unspecified 01/08/2021   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   Onychomycosis due to dermatophyte 01/08/2021   Person with feared health complaint in whom no diagnosis is made 01/08/2021   Presbyopia 01/08/2021   Sensorineural hearing loss, bilateral 01/08/2021   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  Samuel Wilkerson is a 87 y.o. male presenting for a follow up visit. He was last seen in October 2021. At  that time, we recommended continuing with fluticasone nasal spray as well as nasal Atrovent in the morning to help with drying out his nose. For his GERD we continued with Protonix 40mg  daily.  Since the last visit, he has mostly done well. He has been having some swelling of the left side of his face. He has had blood work as well as an MRI. None of this is in our system. He thinks that this was 2 months ago. Regardless everything was negative. They did not see anything apparently.  He tells me that it was painful. He  has been taking amoxicillin "off and on" for 3-4 months. He had an X-ray on the side of his mouth and he got some antibiotics from his ? dentist. He also went to the which is where he had blood work and imaging done. He continues to say that the amoxicillin treats his left sided facial swelling. He will take it for a few days and then stop when the swelling improves. He never had any drainage from the area. He never had swelling anywhere else. He denies any GI symptoms.   He has not seen ENT yet. He seems confused when I ever ask about that.   Otherwise, there have been no changes to his past medical history, surgical history, family history, or social history.    Review of Systems  Constitutional: Negative.  Negative for fever, malaise/fatigue and weight loss.  HENT: Negative.  Negative for congestion, ear discharge and ear pain.   Eyes:  Negative for pain, discharge and redness.  Respiratory:  Negative for cough, sputum production, shortness of breath and wheezing.   Cardiovascular: Negative.  Negative for chest pain and palpitations.  Gastrointestinal:  Negative for abdominal pain, constipation, diarrhea, heartburn, nausea and vomiting.  Skin: Negative.  Negative for itching and rash.  Neurological:  Negative for dizziness and headaches.  Endo/Heme/Allergies:  Negative for environmental allergies. Does not bruise/bleed easily.      Objective:   Blood pressure 122/70, pulse 81, temperature 97.8 F (36.6 C), resp. rate 16, height 6\' 4"  (1.93 m), weight 195 lb (88.5 kg), SpO2 98 %. Body mass index is 23.74 kg/m.   Physical Exam:  Physical Exam Constitutional:      Appearance: He is well-developed.  HENT:     Head: Normocephalic and atraumatic.     Comments: I did not appreciate any facial swelling today. He is hard of hearing which is his usual. There was no tenderness noted at all.    Right Ear: Tympanic membrane, ear canal and external ear normal.     Left Ear: Tympanic  membrane, ear canal and external ear normal.     Nose: No nasal deformity, septal deviation, mucosal edema or rhinorrhea.     Right Turbinates: Not enlarged or swollen.     Left Turbinates: Not enlarged or swollen.     Right Sinus: No maxillary sinus tenderness or frontal sinus tenderness.     Left Sinus: No maxillary sinus tenderness or frontal sinus tenderness.     Mouth/Throat:     Mouth: Mucous membranes are not pale and not dry.     Pharynx: Uvula midline.  Eyes:     General: Lids are normal. Allergic shiner present.        Right eye: No discharge.        Left eye: No discharge.     Conjunctiva/sclera: Conjunctivae normal.     Right eye: Right conjunctiva is not injected. No chemosis.  Left eye: Left conjunctiva is not injected. No chemosis.    Pupils: Pupils are equal, round, and reactive to light.  Cardiovascular:     Rate and Rhythm: Normal rate and regular rhythm.     Heart sounds: Normal heart sounds.  Pulmonary:     Effort: Pulmonary effort is normal. No tachypnea, accessory muscle usage or respiratory distress.     Breath sounds: Normal breath sounds. No wheezing, rhonchi or rales.  Chest:     Chest wall: No tenderness.  Lymphadenopathy:     Cervical: No cervical adenopathy.  Skin:    Coloration: Skin is not pale.     Findings: No abrasion, erythema, petechiae or rash. Rash is not papular, urticarial or vesicular.  Neurological:     Mental Status: He is alert.  Psychiatric:        Behavior: Behavior is cooperative.     Diagnostic studies: none    Malachi Bonds, MD  [facility]

## 2021-04-23 NOTE — Patient Instructions (Addendum)
1. Perennial and seasonal allergic rhinitis (dust mites, dog, cockroach, indoor and outdoor molds, trees, ragweed, and weeds) - Continue with all of your allergy medications.  - It seems that you are doing a good job with your symptoms now.  - Continue with fluticasone EVERY DAY for the best effects. - Continue with nasal ipratropium one spray per nostril EVERY MORNING to help dry out of your nose and prevent the postnasal drip.  - Continue with cetirizine 10mg  EVERY DAY.    2. GERD - Continue with pantoprazole 40mg  daily.   3. Facial swelling - I might want to get some lab work but I want to see wheat they did first. - I also want to get a copy of the imaging. - Chronic antibiotics are not a way to treat facial swelling long term.  4. Return in about 3 months (around 07/24/2021).    Please inform of any Emergency Department visits, hospitalizations, or changes in symptoms. Call 07/26/2021 before going to the ED for breathing or allergy symptoms since we might be able to fit you in for a sick visit. Feel free to contact us anytime with any questions, problems, or concerns.  It was a pleasure to see you again today!  Websites that have reliable patient information: 1. American Academy of Asthma, Allergy, and Immunology: www.aaaai.org 2. Food Allergy Research and Education (FARE): foodallergy.org 3. Mothers of Asthmatics: http://www.asthmacommunitynetwork.org 4. American College of Allergy, Asthma, and Immunology: www.acaai.org   COVID-19 Vaccine Information can be found at: [place] For questions related to vaccine distribution or appointments, please email vaccine@ .com or call [phone].     "Like" [email or web address] on Facebook and Instagram for our latest updates!     HAPPY FALL!     Make sure you are registered to vote! If you have moved or changed any of your contact information, you will need to get this updated  before voting!  In some cases, you MAY be able to register to vote online: [phone]

## 2021-04-26 NOTE — Progress Notes (Signed)
Records came in this morning in the [place] office. I will bring them back to [place] for you to have tomorrow as it is 70 pages.

## 2021-04-28 NOTE — Progress Notes (Signed)
I received his records.  He had a chest CT performed in July 2022.  He still has a 6 mm subpleural right upper lobe nodule which was stable.  Otherwise everything was normal.  A metabolic panel was largely normal aside from an elevated creatinine of 1.57.  A complete blood count was largely normal.  Differential was normal as well.  He had a CT of his face that showed his nodule at the apex of the lung.  Otherwise, there was no obvious abnormality of the CT.  He was referred to ENT in June 2022.  I could not find the ENT note, maybe he has not been to that appointment yet.  The rest of his papers were mostly mental health visits and repetitive lists  of his medications.  I am going to order some lab work.  We will scan the relevant information into epic.  Malachi Bonds, MD [facility]

## 2021-04-28 NOTE — Addendum Note (Signed)
Addended by: Alfonse Spruce on: 04/28/2021 05:24 PM   Modules accepted: Orders

## 2021-04-29 ENCOUNTER — Telehealth: Payer: Self-pay

## 2021-04-29 NOTE — Telephone Encounter (Signed)
Left a message informing patient to call the office regarding Dr. Ellouise Newer note.

## 2021-04-29 NOTE — Telephone Encounter (Signed)
-----   Message from Alfonse Spruce, MD sent at 04/28/2021  5:18 PM EDT ----- Can someone call the patient to let them know that we ordered some labs?  We can send the requisitions via mail or you can pick them up.  Prepare to yell, as he has hearing loss.

## 2021-04-30 NOTE — Telephone Encounter (Signed)
Left a detailed message informing patient that I was mailing his lab requisitions for him to go to a [facility] to have then done. I also send a note with the requisitions informing patient of this as well.

## 2021-05-07 ENCOUNTER — Ambulatory Visit: Payer: No Typology Code available for payment source | Admitting: Allergy & Immunology

## 2021-05-12 ENCOUNTER — Encounter: Payer: Self-pay | Admitting: *Deleted

## 2021-05-13 LAB — ALLERGEN PROFILE, BASIC FOOD
Allergen Corn, IgE: 0.12 kU/L — AB
Chocolate/Cacao IgE: <0.1 kU/L
Egg, Whole IgE: <0.1 kU/L
Food Mix (Seafoods) IgE: POSITIVE — AB
Milk IgE: 0.14 kU/L — AB
Peanut IgE: <0.1 kU/L
Soybean IgE: <0.1 kU/L
Wheat IgE: 0.16 kU/L — AB

## 2021-05-13 LAB — ALPHA-GAL PANEL
Allergen Lamb IgE: <0.1 kU/L
Beef IgE: <0.1 kU/L
IgE (Immunoglobulin E), Serum: 515 IU/mL — ABNORMAL HIGH (ref 6–495)
O215-IgE Alpha-Gal: <0.1 kU/L
Pork IgE: <0.1 kU/L

## 2021-05-13 LAB — C1 ESTERASE INHIBITOR: C1INH SerPl-mCnc: 31 mg/dL (ref 21–39)

## 2021-05-13 LAB — C1 ESTERASE INHIBITOR, FUNCTIONAL: C1INH Functional/C1INH Total MFr SerPl: >93 %mean normal

## 2021-05-13 LAB — TRYPTASE: Tryptase: 5.6 ug/L (ref 2.2–13.2)

## 2021-05-13 LAB — C3 AND C4
Complement C3, Serum: 134 mg/dL (ref 82–167)
Complement C4, Serum: 38 mg/dL (ref 12–38)

## 2021-05-13 LAB — COMPLEMENT COMPONENT C1Q: Complement C1Q: 12.6 mg/dL (ref 10.2–20.3)

## 2021-05-28 ENCOUNTER — Encounter: Payer: Self-pay | Admitting: Podiatry

## 2021-05-28 ENCOUNTER — Ambulatory Visit: Payer: No Typology Code available for payment source | Admitting: Allergy & Immunology

## 2021-05-28 ENCOUNTER — Ambulatory Visit (INDEPENDENT_AMBULATORY_CARE_PROVIDER_SITE_OTHER): Payer: No Typology Code available for payment source | Admitting: Podiatry

## 2021-05-28 ENCOUNTER — Other Ambulatory Visit: Payer: Self-pay

## 2021-05-28 DIAGNOSIS — B351 Tinea unguium: Secondary | ICD-10-CM

## 2021-05-28 DIAGNOSIS — L84 Corns and callosities: Secondary | ICD-10-CM

## 2021-05-28 DIAGNOSIS — M79675 Pain in left toe(s): Secondary | ICD-10-CM | POA: Diagnosis not present

## 2021-05-28 DIAGNOSIS — M79674 Pain in right toe(s): Secondary | ICD-10-CM

## 2021-05-28 NOTE — Progress Notes (Signed)
This patient returns to my office for at risk foot care.  This patient requires this care by a professional since this patient will be at risk due to having  PAD.  This patient is unable to cut nails himself since the patient cannot reach his nails.These nails are painful walking and wearing shoes.  This patient presents for at risk foot care today.  General Appearance  Alert, conversant and in no acute stress.  Vascular  Dorsalis pedis and posterior tibial  pulses are weakly  palpable  bilaterally.  Capillary return is within normal limits  bilaterally. Temperature is within normal limits  bilaterally.  Neurologic  Senn-Weinstein monofilament wire test within normal limits  bilaterally. Muscle power within normal limits bilaterally.  Nails Thick disfigured discolored nails with subungual debris  from hallux to fifth toes bilaterally. No evidence of bacterial infection or drainage bilaterally.  Orthopedic  No limitations of motion  feet .  No crepitus or effusions noted.  Hammer toes  B/L.  Soft Tissue Mass dorsum 4,5 metabase right foot.  Asymptomatic.  Skin  normotropic skin with no porokeratosis noted bilaterally.  No signs of infections or ulcers noted.   Asymptomatic clavi second left  Onychomycosis  Pain in right toes  Pain in left toes  Consent was obtained for treatment procedures.   Mechanical debridement of nails 1-5  bilaterally performed with a nail nipper.  Filed with dremel without incident. Discussed USTM right foot.   Return office visit   10  weeks                   Told patient to return for periodic foot care and evaluation due to potential at risk complications.   Helane Gunther DPM

## 2021-07-12 ENCOUNTER — Encounter: Payer: Self-pay | Admitting: General Practice

## 2021-08-04 ENCOUNTER — Ambulatory Visit: Payer: No Typology Code available for payment source | Admitting: Podiatry

## 2021-08-06 ENCOUNTER — Ambulatory Visit: Payer: No Typology Code available for payment source | Admitting: Podiatry

## 2021-08-10 ENCOUNTER — Ambulatory Visit (INDEPENDENT_AMBULATORY_CARE_PROVIDER_SITE_OTHER): Payer: No Typology Code available for payment source | Admitting: Podiatry

## 2021-08-10 ENCOUNTER — Encounter: Payer: Self-pay | Admitting: Podiatry

## 2021-08-10 ENCOUNTER — Other Ambulatory Visit: Payer: Self-pay

## 2021-08-10 DIAGNOSIS — M799 Soft tissue disorder, unspecified: Secondary | ICD-10-CM | POA: Diagnosis not present

## 2021-08-10 DIAGNOSIS — M79675 Pain in left toe(s): Secondary | ICD-10-CM

## 2021-08-10 DIAGNOSIS — B351 Tinea unguium: Secondary | ICD-10-CM | POA: Diagnosis not present

## 2021-08-10 DIAGNOSIS — I739 Peripheral vascular disease, unspecified: Secondary | ICD-10-CM | POA: Diagnosis not present

## 2021-08-10 DIAGNOSIS — M79674 Pain in right toe(s): Secondary | ICD-10-CM

## 2021-08-10 NOTE — Progress Notes (Signed)
This patient returns to my office for at risk foot care.  This patient requires this care by a professional since this patient will be at risk due to having  PAD.  This patient is unable to cut nails himself since the patient cannot reach his nails.These nails are painful walking and wearing shoes.  This patient presents for at risk foot care today.  General Appearance  Alert, conversant and in no acute stress.  Vascular  Dorsalis pedis and posterior tibial  pulses are weakly  palpable  bilaterally.  Capillary return is within normal limits  bilaterally. Temperature is within normal limits  bilaterally.  Neurologic  Senn-Weinstein monofilament wire test within normal limits  bilaterally. Muscle power within normal limits bilaterally.  Nails Thick disfigured discolored nails with subungual debris  from hallux to fifth toes bilaterally. No evidence of bacterial infection or drainage bilaterally.  Orthopedic  No limitations of motion  feet .  No crepitus or effusions noted.  Hammer toes  B/L.  Soft Tissue Mass dorsum 4,5 metabase right foot.  Asymptomatic.  Skin  normotropic skin with no porokeratosis noted bilaterally.  No signs of infections or ulcers noted.   Asymptomatic clavi second left  Onychomycosis  Pain in right toes  Pain in left toes  Consent was obtained for treatment procedures.   Mechanical debridement of nails 1-5  bilaterally performed with a nail nipper.  Filed with dremel without incident. Discussed USTM right foot.   Return office visit   10  weeks                   Told patient to return for periodic foot care and evaluation due to potential at risk complications.   Tequilla Cousineau DPM  

## 2021-10-25 ENCOUNTER — Ambulatory Visit (INDEPENDENT_AMBULATORY_CARE_PROVIDER_SITE_OTHER): Payer: Medicare PPO | Admitting: Podiatry

## 2021-10-25 ENCOUNTER — Encounter: Payer: Self-pay | Admitting: Podiatry

## 2021-10-25 DIAGNOSIS — L84 Corns and callosities: Secondary | ICD-10-CM | POA: Diagnosis not present

## 2021-10-25 DIAGNOSIS — I739 Peripheral vascular disease, unspecified: Secondary | ICD-10-CM

## 2021-10-25 DIAGNOSIS — M79674 Pain in right toe(s): Secondary | ICD-10-CM

## 2021-10-25 DIAGNOSIS — M2042 Other hammer toe(s) (acquired), left foot: Secondary | ICD-10-CM | POA: Diagnosis not present

## 2021-10-25 DIAGNOSIS — M79675 Pain in left toe(s): Secondary | ICD-10-CM | POA: Diagnosis not present

## 2021-10-25 DIAGNOSIS — B351 Tinea unguium: Secondary | ICD-10-CM | POA: Diagnosis not present

## 2021-10-25 NOTE — Progress Notes (Signed)
This patient returns to my office for at risk foot care.  This patient requires this care by a professional since this patient will be at risk due to having  PAD.  This patient is unable to cut nails himself since the patient cannot reach his nails.These nails are painful walking and wearing shoes.  This patient presents for at risk foot care today. ? ?General Appearance  Alert, conversant and in no acute stress. ? ?Vascular  Dorsalis pedis and posterior tibial  pulses are weakly  palpable  bilaterally.  Capillary return is within normal limits  bilaterally. Temperature is within normal limits  bilaterally. ? ?Neurologic  Senn-Weinstein monofilament wire test within normal limits  bilaterally. Muscle power within normal limits bilaterally. ? ?Nails Thick disfigured discolored nails with subungual debris  from hallux to fifth toes bilaterally. No evidence of bacterial infection or drainage bilaterally. ? ?Orthopedic  No limitations of motion  feet .  No crepitus or effusions noted.  Hammer toes  B/L.   ? ?Skin  normotropic skin with no porokeratosis noted bilaterally.  No signs of infections or ulcers noted.   Symptomatic clavi second left ? ?Onychomycosis  Pain in right toes  Pain in left toes  Clavi secondary second toe hammer toe ? ?Consent was obtained for treatment procedures.   Mechanical debridement of nails 1-5  bilaterally performed with a nail nipper.  Filed with dremel without incident.  Debride clavi second toe with # 15 blade.  Padding dispensed. ? ? ?Return office visit   10  weeks                   Told patient to return for periodic foot care and evaluation due to potential at risk complications. ? ? ?Helane Gunther DPM  ?

## 2021-11-19 ENCOUNTER — Other Ambulatory Visit: Payer: Self-pay

## 2021-11-19 ENCOUNTER — Encounter: Payer: Self-pay | Admitting: Emergency Medicine

## 2021-11-19 ENCOUNTER — Ambulatory Visit
Admission: EM | Admit: 2021-11-19 | Discharge: 2021-11-19 | Disposition: A | Discharge status: Home / Self Care | Payer: Medicare PPO | Attending: Emergency Medicine | Admitting: Emergency Medicine

## 2021-11-19 DIAGNOSIS — K115 Sialolithiasis: Secondary | ICD-10-CM

## 2021-11-19 MED ORDER — AMOXICILLIN-POT CLAVULANATE 875-125 MG PO TABS: 1.0000 | ORAL_TABLET | Freq: Two times a day (BID) | ORAL | 0 refills | Status: AC

## 2021-11-19 NOTE — Discharge Instructions (Addendum)
Warm compresses, try sour things such as lemon drops, sour patch kids, or biting down on lemon slices.  Stop the Benadryl, Zyrtec if you can.  This is probably contributing to this.  Finish antibiotics, even if you feel better. ?

## 2021-11-19 NOTE — ED Triage Notes (Addendum)
Pt reports intermittent left sided facial/jaw swelling since December. Pt reports has [facility] and [facility] and reports "no one knows what it is" but states "I get antibiotics and it goes away. " Pt reports finishes antibiotics and swelling/discomfort returns.  ? ?

## 2021-11-19 NOTE — ED Triage Notes (Addendum)
Pt noted to have left sided facial swelling noted to temple that extends to left jaw. Airway patent. Nad noted at time of presenting to patient access. ?

## 2021-11-19 NOTE — ED Provider Notes (Signed)
HPI ? ?SUBJECTIVE: ? ?Samuel Wilkerson is a 88 y.o. male who presents with left facial/jaw swelling, pain starting last night.  It has not changed in size since it started.  He states that he has been having issues with this since before Christmas, and that it "usually gets better with antibiotics."  He denies fevers, dental pain, decayed teeth, trismus, neck stiffness, overlying erythema, swelling of his tongue.  He tried topical antibiotic ointment without improvement in symptoms.  No aggravating factors.  Patient has been seen by multiple providers including here, his PCP, [facility], his dentist, and had an ENT evaluation in April.  CT showed no definite acute lesions, patient was thought to have  Sialadenitis and TMJ arthralgia . supportive care recommended.  past medical history also includes coronary disease, hyperlipidemia, hypertension, glaucoma.  He does not smoke, drink or dip.  [facility]. ?. ?Past Medical History:  ?Diagnosis Date  ? Coronary artery disease   ? Hyperlipidemia   ? Hypertension   ? ? ?Past Surgical History:  ?Procedure Laterality Date  ? CARDIAC CATHETERIZATION    ? colonoscopy    ? CORONARY ANGIOPLASTY    ? TONSILLECTOMY    ? ? ?Family History  ?Problem Relation Age of Onset  ? Diabetes Sister   ?     b/l below knee amputations  ? Stroke Brother   ? Heart attack Brother   ? ? ?Social History  ? ?Tobacco Use  ? Smoking status: Never  ? Smokeless tobacco: Never  ?Vaping Use  ? Vaping Use: Never used  ?Substance Use Topics  ? Alcohol use: No  ?  Alcohol/week: 0.0 standard drinks  ? Drug use: No  ? ? ?No current facility-administered medications for this encounter. ? ?Current Outpatient Medications:  ?  amoxicillin-clavulanate (AUGMENTIN) 875-125 MG tablet, Take 1 tablet by mouth 2 (two) times daily for 10 days., Disp: 20 tablet, Rfl: 0 ?  Acidophilus Lactobacillus CAPS, Take 1 capsule by mouth 2 (two) times daily., Disp: , Rfl:  ?  albuterol (PROVENTIL HFA;VENTOLIN HFA) 108 (90 Base) MCG/ACT  inhaler, Inhale 1-2 puffs into the lungs every 6 (six) hours as needed for wheezing or shortness of breath., Disp: 1 Inhaler, Rfl: 0 ?  amLODipine (NORVASC) 10 MG tablet, Take 5 mg by mouth daily., Disp: , Rfl:  ?  aspirin EC 81 MG tablet, Take 81 mg by mouth daily., Disp: , Rfl:  ?  cetirizine (ZYRTEC) 10 MG tablet, Take 1 tablet (10 mg total) by mouth daily., Disp: 90 tablet, Rfl: 1 ?  chlordiazePOXIDE (LIBRIUM) 10 MG capsule, Take 20 mg by mouth at bedtime., Disp: , Rfl:  ?  Cholecalciferol (VITAMIN D3) 25 MCG (1000 UT) CAPS, Take 1,000 Units by mouth daily. , Disp: , Rfl:  ?  clobetasol cream (TEMOVATE) 0.05 %, Apply topically., Disp: , Rfl:  ?  diphenhydrAMINE (BENADRYL) 25 mg capsule, as needed., Disp: , Rfl:  ?  dorzolamide (TRUSOPT) 2 % ophthalmic solution, Place 1 drop into both eyes 2 (two) times daily., Disp: , Rfl:  ?  dorzolamide (TRUSOPT) 2 % ophthalmic solution, Apply to eye., Disp: , Rfl:  ?  fluticasone (FLONASE) 50 MCG/ACT nasal spray, Place 1 spray into both nostrils daily., Disp: 16 g, Rfl: 5 ?  guaiFENesin 200 MG tablet, Take 200 mg by mouth 2 (two) times a day., Disp: , Rfl:  ?  hydrochlorothiazide (HYDRODIURIL) 50 MG tablet, Take 50 mg by mouth daily., Disp: , Rfl:  ?  hydrocortisone-pramoxine (  PROCTOFOAM-HC) rectal foam, Place 1 applicator rectally as needed for hemorrhoids or itching., Disp: , Rfl:  ?  hydrophilic ointment, Apply topically., Disp: , Rfl:  ?  ipratropium (ATROVENT) 0.03 % nasal spray, Place 1 spray into both nostrils every morning., Disp: 30 mL, Rfl: 5 ?  latanoprost (XALATAN) 0.005 % ophthalmic solution, Apply 1 drop to eye at bedtime., Disp: , Rfl:  ?  lisinopril (PRINIVIL,ZESTRIL) 20 MG tablet, Take 10 mg by mouth daily., Disp: , Rfl:  ?  magic mouthwash w/lidocaine SOLN, Take 10 mLs by mouth as directed., Disp: , Rfl:  ?  Menthol-Methyl Salicylate (THERA-GESIC) 0.5-15 % CREA, Apply topically., Disp: , Rfl:  ?  metoprolol (TOPROL-XL) 50 MG 24 hr tablet, Take 50 mg by mouth  2 (two) times a day., Disp: , Rfl:  ?  metoprolol tartrate (LOPRESSOR) 50 MG tablet, Take by mouth., Disp: , Rfl:  ?  miconazole (MICOTIN) 2 % cream, Apply 1 application topically 2 (two) times daily., Disp: 35 g, Rfl: 1 ?  Miconazole Nitrate 2 % KIT, Apply topically., Disp: , Rfl:  ?  MISC NATURAL PRODUCTS EX, Apply topically. Pain relieving cream - as needed for back, Disp: , Rfl:  ?  montelukast (SINGULAIR) 10 MG tablet, Take 10 mg by mouth at bedtime., Disp: , Rfl:  ?  montelukast (SINGULAIR) 10 MG tablet, Take 1 tablet by mouth daily., Disp: , Rfl:  ?  naproxen (NAPROSYN) 375 MG tablet, Take 375 mg by mouth 2 (two) times daily with a meal., Disp: , Rfl:  ?  nitroGLYCERIN (NITROSTAT) 0.4 MG SL tablet, Place 0.4 mg under the tongue every 5 (five) minutes as needed. For chest pain, Disp: , Rfl:  ?  pantoprazole (PROTONIX) 40 MG tablet, TAKE 1 TABLET(40 MG) BY MOUTH DAILY, Disp: 90 tablet, Rfl: 0 ?  phenylephrine (,USE FOR PREPARATION-H,) 0.25 % suppository, Place rectally., Disp: , Rfl:  ?  polyethylene glycol (MIRALAX / GLYCOLAX) packet, Take 17 g by mouth daily., Disp: , Rfl:  ?  polyethylene glycol powder (GLYCOLAX/MIRALAX) 17 GM/SCOOP powder, Take by mouth., Disp: , Rfl:  ?  potassium chloride (K-DUR) 10 MEQ tablet, Take 10 mEq by mouth daily., Disp: , Rfl:  ?  potassium chloride (KLOR-CON) 10 MEQ tablet, TAKE ONE TABLET BY MOUTH EVERY DAY FOR POTASSIUM REPLACEMENT, Disp: , Rfl:  ?  rosuvastatin (CRESTOR) 40 MG tablet, Take 40 mg by mouth daily., Disp: , Rfl:  ?  Skin Protectants, Misc. [facility]) OINT, Apply topically., Disp: , Rfl:  ?  terbinafine (LAMISIL) 1 % cream, Apply topically daily. , Disp: , Rfl:  ?  triamcinolone cream (KENALOG) 0.5 %, Apply 1 application topically as needed. , Disp: , Rfl:  ? ?No Known Allergies ? ? ?ROS ? ?As noted in HPI.  ? ?Physical Exam ? ?BP (!) 171/89 (BP Location: Right Arm)   Pulse 75   Temp 97.8 ?F (36.6 ?C) (Oral)   Resp 18   Ht '6\' 4"'  (1.93 m)   Wt 88.9 kg   SpO2  98%   BMI 23.86 kg/m?  ? ?Constitutional: Well developed, well nourished, no acute distress ?Eyes:  EOMI, conjunctiva normal bilaterally ?HENT: Normocephalic, atraumatic,mucus membranes moist.  Mild nontender left facial swelling along the cheek/parotid gland.  No periorbital erythema, edema.  No overlying erythema, induration.  No expressible pus from [facility] or Stensen's duct.  No appreciable sublingual swelling, palpable stone.  No submandibular swelling, tenderness.  No drooling, trismus.  Dentition normal, nontender.  Left TMJ nontender. ?Neck:no  Left-sided cervical lymphadenopathy ?Respiratory: Normal inspiratory effort ?Cardiovascular: Normal rate ?GI: nondistended ?skin: No rash, skin intact ?Musculoskeletal: no deformities ?Neurologic: Alert & oriented x 3, no focal neuro deficits ?Psychiatric: Speech and behavior appropriate ? ? ?ED Course ? ? ?Medications - No data to display ? ?No orders of the defined types were placed in this encounter. ? ? ?No results found for this or any previous visit (from the past 24 hour(s)). ?No results found. ? ?ED Clinical Impression ? ?1. Parotid sialolithiasis   ?  ? ?ED Assessment/Plan ? ?Previous records reviewed.  As noted in HPI. ? ?Patient with recurrent parotid sialolithiasis.  Will send home with Augmentin twice daily for 10 days for possible infection per up-to-date guidelines as cause of his acute swelling although there are currently no signs of infection on exam.  Advised sialaogogues such as lemon juice, sour patch kids, warm compresses.  He will need to follow-up with ENT if this does not resolve his symptoms.  ER return precautions given. ? ?Discussed MDM, treatment plan, and plan for follow-up with patient. Discussed sn/sx that should prompt return to the ED. patient agrees with plan.  ? ?Meds ordered this encounter  ?Medications  ? amoxicillin-clavulanate (AUGMENTIN) 875-125 MG tablet  ?  Sig: Take 1 tablet by mouth 2 (two) times daily for 10 days.  ?   Dispense:  20 tablet  ?  Refill:  0  ? ? ? ? ?*This clinic note was created using [employer]. Therefore, there may be occasional mistakes despite careful proofreading. ? ?? ? ?  ?Melynda Ripple,

## 2022-01-26 ENCOUNTER — Ambulatory Visit: Payer: No Typology Code available for payment source | Admitting: Podiatry

## 2022-01-28 ENCOUNTER — Ambulatory Visit: Payer: No Typology Code available for payment source | Admitting: Podiatry

## 2022-02-09 ENCOUNTER — Encounter: Payer: Self-pay | Admitting: Cardiology

## 2022-02-09 ENCOUNTER — Ambulatory Visit: Payer: Medicare PPO | Admitting: Cardiology

## 2022-02-09 NOTE — Progress Notes (Deleted)
Clinical Summary [name] is a 88 y.o.male former patient of Dr Bronson Ing, this is our first visit together. Seen for the following medical problems.  1.CAD - history of MIN in 1993, PCI to diag - 2006 cath occluded RCA, 90% diag treated medically   2. HTN   3. Hyperlipidemia        Past Medical History:  Diagnosis Date   Coronary artery disease    Hyperlipidemia    Hypertension      No Known Allergies   Current Outpatient Medications  Medication Sig Dispense Refill   Acidophilus Lactobacillus CAPS Take 1 capsule by mouth 2 (two) times daily.     albuterol (PROVENTIL HFA;VENTOLIN HFA) 108 (90 Base) MCG/ACT inhaler Inhale 1-2 puffs into the lungs every 6 (six) hours as needed for wheezing or shortness of breath. 1 Inhaler 0   amLODipine (NORVASC) 10 MG tablet Take 5 mg by mouth daily.     aspirin EC 81 MG tablet Take 81 mg by mouth daily.     cetirizine (ZYRTEC) 10 MG tablet Take 1 tablet (10 mg total) by mouth daily. 90 tablet 1   chlordiazePOXIDE (LIBRIUM) 10 MG capsule Take 20 mg by mouth at bedtime.     Cholecalciferol (VITAMIN D3) 25 MCG (1000 UT) CAPS Take 1,000 Units by mouth daily.      clobetasol cream (TEMOVATE) 0.05 % Apply topically.     diphenhydrAMINE (BENADRYL) 25 mg capsule as needed.     dorzolamide (TRUSOPT) 2 % ophthalmic solution Place 1 drop into both eyes 2 (two) times daily.     dorzolamide (TRUSOPT) 2 % ophthalmic solution Apply to eye.     fluticasone (FLONASE) 50 MCG/ACT nasal spray Place 1 spray into both nostrils daily. 16 g 5   guaiFENesin 200 MG tablet Take 200 mg by mouth 2 (two) times a day.     hydrochlorothiazide (HYDRODIURIL) 50 MG tablet Take 50 mg by mouth daily.     hydrocortisone-pramoxine (PROCTOFOAM-HC) rectal foam Place 1 applicator rectally as needed for hemorrhoids or itching.     hydrophilic ointment Apply topically.     ipratropium (ATROVENT) 0.03 % nasal spray Place 1 spray into both nostrils every morning. 30 mL 5    latanoprost (XALATAN) 0.005 % ophthalmic solution Apply 1 drop to eye at bedtime.     lisinopril (PRINIVIL,ZESTRIL) 20 MG tablet Take 10 mg by mouth daily.     magic mouthwash w/lidocaine SOLN Take 10 mLs by mouth as directed.     Menthol-Methyl Salicylate (THERA-GESIC) 0.5-15 % CREA Apply topically.     metoprolol (TOPROL-XL) 50 MG 24 hr tablet Take 50 mg by mouth 2 (two) times a day.     metoprolol tartrate (LOPRESSOR) 50 MG tablet Take by mouth.     miconazole (MICOTIN) 2 % cream Apply 1 application topically 2 (two) times daily. 35 g 1   Miconazole Nitrate 2 % KIT Apply topically.     MISC NATURAL PRODUCTS EX Apply topically. Pain relieving cream - as needed for back     montelukast (SINGULAIR) 10 MG tablet Take 10 mg by mouth at bedtime.     montelukast (SINGULAIR) 10 MG tablet Take 1 tablet by mouth daily.     naproxen (NAPROSYN) 375 MG tablet Take 375 mg by mouth 2 (two) times daily with a meal.     nitroGLYCERIN (NITROSTAT) 0.4 MG SL tablet Place 0.4 mg under the tongue every 5 (five) minutes as needed. For chest pain  pantoprazole (PROTONIX) 40 MG tablet TAKE 1 TABLET(40 MG) BY MOUTH DAILY 90 tablet 0   phenylephrine (,USE FOR PREPARATION-H,) 0.25 % suppository Place rectally.     polyethylene glycol (MIRALAX / GLYCOLAX) packet Take 17 g by mouth daily.     polyethylene glycol powder (GLYCOLAX/MIRALAX) 17 GM/SCOOP powder Take by mouth.     potassium chloride (K-DUR) 10 MEQ tablet Take 10 mEq by mouth daily.     potassium chloride (KLOR-CON) 10 MEQ tablet TAKE ONE TABLET BY MOUTH EVERY DAY FOR POTASSIUM REPLACEMENT     rosuvastatin (CRESTOR) 40 MG tablet Take 40 mg by mouth daily.     Skin Protectants, Misc. [facility]) OINT Apply topically.     terbinafine (LAMISIL) 1 % cream Apply topically daily.      triamcinolone cream (KENALOG) 0.5 % Apply 1 application topically as needed.      No current facility-administered medications for this visit.     Past Surgical History:   Procedure Laterality Date   CARDIAC CATHETERIZATION     colonoscopy     CORONARY ANGIOPLASTY     TONSILLECTOMY       No Known Allergies    Family History  Problem Relation Age of Onset   Diabetes Sister        b/l below knee amputations   Stroke Brother    Heart attack Brother      Social History [name] reports that he has never smoked. He has never used smokeless tobacco. [name] reports no history of alcohol use.   Review of Systems CONSTITUTIONAL: No weight loss, fever, chills, weakness or fatigue.  HEENT: Eyes: No visual loss, blurred vision, double vision or yellow sclerae.No hearing loss, sneezing, congestion, runny nose or sore throat.  SKIN: No rash or itching.  CARDIOVASCULAR:  RESPIRATORY: No shortness of breath, cough or sputum.  GASTROINTESTINAL: No anorexia, nausea, vomiting or diarrhea. No abdominal pain or blood.  GENITOURINARY: No burning on urination, no polyuria NEUROLOGICAL: No headache, dizziness, syncope, paralysis, ataxia, numbness or tingling in the extremities. No change in bowel or bladder control.  MUSCULOSKELETAL: No muscle, back pain, joint pain or stiffness.  LYMPHATICS: No enlarged nodes. No history of splenectomy.  PSYCHIATRIC: No history of depression or anxiety.  ENDOCRINOLOGIC: No reports of sweating, cold or heat intolerance. No polyuria or polydipsia.  Marland Kitchen   Physical Examination There were no vitals filed for this visit. There were no vitals filed for this visit.  Gen: resting comfortably, no acute distress HEENT: no scleral icterus, pupils equal round and reactive, no palptable cervical adenopathy,  CV Resp: Clear to auscultation bilaterally GI: abdomen is soft, non-tender, non-distended, normal bowel sounds, no hepatosplenomegaly MSK: extremities are warm, no edema.  Skin: warm, no rash Neuro:  no focal deficits Psych: appropriate affect   Diagnostic Studies     Assessment and Plan        Samuel Wilkerson,  M.D., F.A.C.C.

## 2022-04-27 ENCOUNTER — Telehealth: Payer: Self-pay | Admitting: Allergy & Immunology

## 2022-04-27 NOTE — Telephone Encounter (Signed)
Faxed renewal of services request to [facility], [phone]   

## 2022-05-02 NOTE — Telephone Encounter (Signed)
[facility]  called to see when Samuel Wilkerson was scheduled so they could send out the new auth. Patient doesn't have a current appt. I called and left a voicemail on his home phone. I was unable to leave a voicemail on his cell.   Coordinator states he needs a appt scheduled before she can issue out a new auth.

## 2022-05-03 NOTE — Telephone Encounter (Signed)
Patient is scheduled to see Dr Ernst Bowler on tomorrow. His referral exp on tomorrow so that will be covered. I contacted the [facility] to let them know and she stated we will need to send in a new request once we schedule his next visit date.

## 2022-05-04 ENCOUNTER — Encounter: Payer: Self-pay | Admitting: Allergy & Immunology

## 2022-05-04 ENCOUNTER — Ambulatory Visit (INDEPENDENT_AMBULATORY_CARE_PROVIDER_SITE_OTHER): Payer: No Typology Code available for payment source | Admitting: Allergy & Immunology

## 2022-05-04 VITALS — BP 124/76 | HR 70 | Temp 97.8°F | Resp 18 | Ht 74.5 in | Wt 198.4 lb

## 2022-05-04 DIAGNOSIS — J3089 Other allergic rhinitis: Secondary | ICD-10-CM

## 2022-05-04 DIAGNOSIS — J302 Other seasonal allergic rhinitis: Secondary | ICD-10-CM

## 2022-05-04 DIAGNOSIS — K219 Gastro-esophageal reflux disease without esophagitis: Secondary | ICD-10-CM | POA: Diagnosis not present

## 2022-05-04 DIAGNOSIS — J310 Chronic rhinitis: Secondary | ICD-10-CM

## 2022-05-04 DIAGNOSIS — R220 Localized swelling, mass and lump, head: Secondary | ICD-10-CM

## 2022-05-04 MED ORDER — IPRATROPIUM BROMIDE 0.03 % NA SOLN: 1.0000 | Freq: Every morning | NASAL | 11 refills | Status: DC

## 2022-05-04 MED ORDER — PANTOPRAZOLE SODIUM 40 MG PO TBEC: 40.0000 mg | DELAYED_RELEASE_TABLET | Freq: Every day | ORAL | 3 refills | Status: DC

## 2022-05-04 MED ORDER — FLUTICASONE PROPIONATE 50 MCG/ACT NA SUSP: 1.0000 | Freq: Every day | NASAL | 11 refills | Status: DC

## 2022-05-04 NOTE — Progress Notes (Signed)
FOLLOW UP  Date of Service/Encounter:  05/04/22   Assessment:   Recurrent pharyngitis - ? GERD   Perennial and seasonal allergic rhinitis (dust mites, dog, cockroach, indoor and outdoor molds, trees, ragweed, and weeds) - well controlled    Facial swelling - unknown etiology but normal imaging and labs   Chest CT with 6 mm subpleural right upper lobe nodule (stable)   Plan/Recommendations:   1. Perennial and seasonal allergic rhinitis (dust mites, dog, cockroach, indoor and outdoor molds, trees, ragweed, and weeds) - Continue with all of your allergy medications.  - It seems that you are doing a good job with your symptoms now.  - Continue with fluticasone EVERY DAY for the best effects. - Continue with nasal ipratropium one spray per nostril EVERY MORNING to help dry out of your nose and prevent the postnasal drip.  - Continue with cetirizine 10mg  EVERY DAY.    2. GERD - Continue with pantoprazole 40mg  daily.   3. Return in about 1 year (around 05/05/2023).    Subjective:   Samuel Wilkerson is a 88 y.o. 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   Anxiety 01/08/2021   Atypical facial pain 01/08/2021   Bilateral pseudophakia 01/08/2021   Colonic polyp 01/08/2021   Encounter for fitting and adjustment of hearing aid 01/08/2021   Encounter for immunization 01/08/2021   Idiopathic urticaria 01/08/2021   Jaw pain 01/08/2021   Lens replaced by other means 01/08/2021   Localized swelling, mass and lump, head 01/08/2021   Low back pain, unspecified 01/08/2021   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   Onychomycosis due to dermatophyte 01/08/2021   Person with feared health complaint in whom  no diagnosis is made 01/08/2021   Presbyopia 01/08/2021   Sensorineural hearing loss, bilateral 01/08/2021   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  Samuel Wilkerson is a 88 y.o. male presenting for a follow up visit.  He was last seen in October 2022.  At that time, we continue with all of his allergy medications.  We recommended doing Flonase every day for the best effect and nasal ipratropium every morning to help with drying out his nose.  He was also continued on cetirizine 10 mg daily.  For his reflux, he was continued on pantoprazole 40 mg daily.  Facial swelling was not impressive.  He had been on multiple rounds of antibiotics without any improvement.  Since last visit, he has done well. He is doing fine from a nasal perspective. He is using his nasal sprays and needs some refills. He has not had any sinus infections at all since the last visit. He has done well. Rhinitis symptoms are well controlled. He does need some refills sent to the [facility].  She remains on cetirizine 10mg  daily.   He has been having some jaw pain for the past 6 months. He continues to get some antibiotics for this intermittently from Dr. Karie Kirks. It was so bad on Sunday and she ended up going to the emergency room in [place] (the [place]). He got some Robaxin. He got 90 tablets and he has been using one tablet three times daily with some mild improvement in symptoms. This was prescribed the ED.   Otherwise, there have been no changes to his past medical history, surgical history, family history, or  social history.    Review of Systems  Constitutional: Negative.  Negative for chills, fever, malaise/fatigue and weight loss.  HENT: Negative.  Negative for congestion, ear discharge and ear pain.   Eyes:  Negative for pain, discharge and redness.  Respiratory:  Negative for cough, sputum production, shortness of breath and wheezing.   Cardiovascular: Negative.  Negative for chest pain and palpitations.  Gastrointestinal:  Negative for abdominal pain, constipation, diarrhea, heartburn, nausea and vomiting.  Skin: Negative.  Negative for itching and rash.  Neurological:  Negative for dizziness and headaches.  Endo/Heme/Allergies:  Negative for environmental allergies. Does not bruise/bleed easily.       Objective:   Blood pressure 124/76, pulse 70, temperature 97.8 F (36.6 C), resp. rate 18, height 6' 2.5" (1.892 m), weight 198 lb 6 oz (90 kg), SpO2 96 %. Body mass index is 25.13 kg/m.    Physical Exam Vitals reviewed.  Constitutional:      Appearance: He is well-developed.  HENT:     Head: Normocephalic and atraumatic.     Comments: I did not appreciate any facial swelling today. He is hard of hearing which is his usual. There was no tenderness noted at all.    Right Ear: Tympanic membrane, ear canal and external ear normal.     Left Ear: Tympanic membrane, ear canal and external ear normal.     Nose: No nasal deformity, septal deviation, mucosal edema or rhinorrhea.     Right Turbinates: Enlarged, swollen and pale.     Left Turbinates: Enlarged, swollen and pale.     Right Sinus: No maxillary sinus tenderness or frontal sinus tenderness.     Left Sinus: No maxillary sinus tenderness or frontal sinus tenderness.     Mouth/Throat:     Mouth: Mucous membranes are not pale and not dry.     Pharynx: Uvula midline.  Eyes:     General: Lids are normal. Allergic shiner present.        Right eye: No discharge.        Left eye: No discharge.     Conjunctiva/sclera:  Conjunctivae normal.     Right eye: Right conjunctiva is not injected. No chemosis.    Left eye: Left conjunctiva is not injected. No chemosis.    Pupils: Pupils are equal, round, and reactive to light.  Cardiovascular:     Rate and Rhythm: Normal rate and regular rhythm.     Heart sounds: Normal heart sounds.  Pulmonary:     Effort: Pulmonary effort is normal. No tachypnea, accessory muscle usage or respiratory distress.     Breath sounds: Normal breath sounds. No wheezing, rhonchi or rales.  Chest:     Chest wall: No tenderness.  Lymphadenopathy:     Cervical: No cervical adenopathy.  Skin:    General: Skin is warm.     Capillary Refill: Capillary refill takes less than 2 seconds.  Coloration: Skin is not pale.     Findings: No abrasion, erythema, petechiae or rash. Rash is not papular, urticarial or vesicular.  Neurological:     Mental Status: He is alert.  Psychiatric:        Behavior: Behavior is cooperative.      Diagnostic studies: none     Malachi Bonds, MD  [facility]

## 2022-05-04 NOTE — Patient Instructions (Addendum)
1. Perennial and seasonal allergic rhinitis (dust mites, dog, cockroach, indoor and outdoor molds, trees, ragweed, and weeds) - Continue with all of your allergy medications.  - It seems that you are doing a good job with your symptoms now.  - Continue with fluticasone EVERY DAY for the best effects. - Continue with nasal ipratropium one spray per nostril EVERY MORNING to help dry out of your nose and prevent the postnasal drip.  - Continue with cetirizine 10mg  EVERY DAY.    2. GERD - Continue with pantoprazole 40mg  daily.   3. Return in about 1 year (around 05/05/2023).    Please inform us of any Emergency Department visits, hospitalizations, or changes in symptoms. Call us before going to the ED for breathing or allergy symptoms since we might be able to fit you in for a sick visit. Feel free to contact us anytime with any questions, problems, or concerns.  It was a pleasure to see you again today!  Websites that have reliable patient information: 1. American Academy of Asthma, Allergy, and Immunology: www.aaaai.org 2. Food Allergy Research and Education (FARE): foodallergy.org 3. Mothers of Asthmatics: http://www.asthmacommunitynetwork.org 4. American College of Allergy, Asthma, and Immunology: www.acaai.org   COVID-19 Vaccine Information can be found at: ShippingScam.co.uk For questions related to vaccine distribution or appointments, please email [email or web address] or call [phone].     "Like" [place] on Facebook and Instagram for our latest updates!      Make sure you are registered to vote! If you have moved or changed any of your contact information, you will need to get this updated before voting!  In some cases, you MAY be able to register to vote online: [email or web address]

## 2022-05-24 NOTE — Telephone Encounter (Signed)
Called and spoke the [facility] - patient does not have a current appointment scheduled as he is due back in a year (he is on the recall list). Was told to send authorization regardless of having an appointment scheduled or not as there is a possibility of patient needed appointment sooner than Oct 2024. Was told to follow up in about a week or 2, [phone] ext [phone])   Faxed updated renewal of services request to [facility] [phone]

## 2022-06-01 ENCOUNTER — Other Ambulatory Visit (HOSPITAL_COMMUNITY): Payer: Self-pay | Admitting: Nurse Practitioner

## 2022-06-01 DIAGNOSIS — K1120 Sialoadenitis, unspecified: Secondary | ICD-10-CM

## 2022-06-15 ENCOUNTER — Ambulatory Visit (HOSPITAL_COMMUNITY)
Admission: RE | Admit: 2022-06-15 | Discharge: 2022-06-15 | Disposition: A | Discharge status: Home / Self Care | Payer: Medicare PPO | Source: Ambulatory Visit | Attending: Nurse Practitioner | Admitting: Nurse Practitioner

## 2022-06-15 DIAGNOSIS — K1120 Sialoadenitis, unspecified: Secondary | ICD-10-CM | POA: Diagnosis present

## 2022-06-30 NOTE — Telephone Encounter (Signed)
Received updated authorization. I have updated it in system.  

## 2022-07-27 ENCOUNTER — Ambulatory Visit (INDEPENDENT_AMBULATORY_CARE_PROVIDER_SITE_OTHER): Payer: No Typology Code available for payment source | Admitting: Allergy & Immunology

## 2022-07-27 ENCOUNTER — Encounter: Payer: Self-pay | Admitting: Allergy & Immunology

## 2022-07-27 VITALS — BP 150/90 | HR 68 | Temp 98.4°F | Resp 14

## 2022-07-27 DIAGNOSIS — K219 Gastro-esophageal reflux disease without esophagitis: Secondary | ICD-10-CM

## 2022-07-27 DIAGNOSIS — R220 Localized swelling, mass and lump, head: Secondary | ICD-10-CM

## 2022-07-27 DIAGNOSIS — J302 Other seasonal allergic rhinitis: Secondary | ICD-10-CM

## 2022-07-27 DIAGNOSIS — J3089 Other allergic rhinitis: Secondary | ICD-10-CM | POA: Diagnosis not present

## 2022-07-27 MED ORDER — CETIRIZINE HCL 10 MG PO TABS: 10.0000 mg | ORAL_TABLET | Freq: Every day | ORAL | 1 refills | Status: AC

## 2022-07-27 MED ORDER — FLUTICASONE PROPIONATE 50 MCG/ACT NA SUSP: 1.0000 | Freq: Every day | NASAL | 11 refills | Status: DC

## 2022-07-27 MED ORDER — IPRATROPIUM BROMIDE 0.03 % NA SOLN: 1.0000 | Freq: Every morning | NASAL | 11 refills | Status: DC

## 2022-07-27 NOTE — Progress Notes (Signed)
FOLLOW UP  Date of Service/Encounter:  07/27/22   Assessment:   Recurrent pharyngitis - ? GERD   Perennial and seasonal allergic rhinitis (dust mites, dog, cockroach, indoor and outdoor molds, trees, ragweed, and weeds) - well controlled    Facial swelling - unknown etiology but normal imaging and labs    Chest CT with 6 mm subpleural right upper lobe nodule (stable)    Plan/Recommendations:   1. Perennial and seasonal allergic rhinitis (dust mites, dog, cockroach, indoor and outdoor molds, trees, ragweed, and weeds) - Continue with all of your allergy medications.  - It seems that you are doing a good job with your symptoms now.  - Continue with fluticasone EVERY DAY for the best effects. - Continue with nasal ipratropium one spray per nostril EVERY MORNING to help dry out of your nose and prevent the postnasal drip.  - Continue with cetirizine 10mg  EVERY DAY.    2. GERD - Continue with pantoprazole 40mg  daily.   3. Return in about 6 months (around 01/25/2023).    Subjective:   Samuel Wilkerson is a 89 y.o. 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   Anxiety 01/08/2021   Atypical facial pain 01/08/2021   Bilateral pseudophakia 01/08/2021   Colonic polyp 01/08/2021   Encounter for fitting and adjustment of hearing aid 01/08/2021   Encounter for immunization 01/08/2021   Idiopathic urticaria 01/08/2021   Jaw pain 01/08/2021   Lens replaced by other means 01/08/2021   Localized swelling, mass and lump, head 01/08/2021   Low back pain, unspecified 01/08/2021   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   Onychomycosis due to dermatophyte 01/08/2021   Person with feared health complaint in whom no diagnosis is made 01/08/2021   Presbyopia 01/08/2021   Sensorineural hearing loss, bilateral  01/08/2021   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  Samuel Wilkerson is a 89 y.o. male presenting for a follow up visit.  He was last seen in October 2023.  At that time, we continue with all of his allergy medications.  We continue with Flonase every day as well as ipratropium every morning.  He also is on Zyrtec 10 mg daily.  For his reflux, we continue pantoprazole 40 mg daily.  Since last visit, he has done well.   He tells me a story about his glaucoma appointments. Apparently, he went to the appointment on the wrong day. He has an appointment in two weeks.  He has been doing this at our office as well.  He was in the office at least weekly for the past several weeks.  Allergic Rhinitis Symptom History: Allergies have been under good control. Worst time of the year for his nasal symptoms are in the spring. The medicine seems to be helping him out. He does use the nose sprays as directed.  He does not mind the scents of the nose sprays at all.  He remains on the cetirizine.  He has not been on any antibiotics for any sinus infections since the last time we talked.  He does bring up his left-sided facial swelling.  We have recommended that he go see a dentist for this in the past.  He is called a few times requesting antibiotics for the swelling.  He talks about some kind of mouthwash. He likes the mouthwash. He describes what sounds like nystatin to swish swallow. He does this twice daily.  Review of his medication list shows that he has Magic mouthwash.  He denies any reflux  symptoms.  He continues pantoprazole occasionally.  He tells me that he forgot his hearing aids today.  He only uses them in Sunday school and church. He tells me that they have a little remote that he uses to change their settings.   He served in [place] for 2 years.  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  Otherwise, there have been no changes to his past medical history, surgical history, family history, or social history.    Review of Systems  Constitutional: Negative.  Negative for chills, fever, malaise/fatigue and weight loss.  HENT: Negative.  Negative for congestion, ear discharge and ear pain.   Eyes:  Negative for pain, discharge and redness.  Respiratory:  Negative for cough, sputum production, shortness of breath and wheezing.   Cardiovascular: Negative.  Negative for chest pain and palpitations.  Gastrointestinal:  Negative for abdominal pain, constipation, diarrhea, heartburn, nausea and vomiting.  Skin: Negative.  Negative for itching and rash.  Neurological:  Negative for dizziness and headaches.  Endo/Heme/Allergies:  Negative for environmental allergies. Does not bruise/bleed easily.       Objective:   Blood pressure (!) 150/90, pulse 68, temperature 98.4 F (36.9 C), resp. rate 14, SpO2 95 %. There is no height or weight on file to calculate BMI.    Physical Exam Vitals reviewed.  Constitutional:      Appearance: He is well-developed.  HENT:     Head: Normocephalic and atraumatic.     Comments: I did not appreciate any facial swelling today. He is hard of hearing which is his usual. There was no tenderness noted at all.    Right Ear: Tympanic membrane, ear canal and external ear normal.     Left Ear: Tympanic membrane, ear canal and external ear normal.     Nose: No nasal deformity, septal deviation, mucosal edema or  rhinorrhea.     Right Turbinates: Enlarged, swollen and pale.     Left Turbinates: Enlarged, swollen and pale.     Right Sinus: No maxillary sinus tenderness or frontal sinus tenderness.     Left Sinus: No maxillary sinus tenderness or frontal sinus tenderness.     Mouth/Throat:     Mouth: Mucous membranes are not pale and not dry.     Pharynx: Uvula midline.  Eyes:     General: Lids are normal. Allergic shiner present.        Right eye: No discharge.        Left eye: No discharge.     Conjunctiva/sclera: Conjunctivae normal.     Right eye: Right conjunctiva is not injected. No chemosis.    Left eye: Left conjunctiva is not injected. No chemosis.    Pupils: Pupils are equal, round,  and reactive to light.  Cardiovascular:     Rate and Rhythm: Normal rate and regular rhythm.     Heart sounds: Normal heart sounds.  Pulmonary:     Effort: Pulmonary effort is normal. No tachypnea, accessory muscle usage or respiratory distress.     Breath sounds: Normal breath sounds. No wheezing, rhonchi or rales.  Chest:     Chest wall: No tenderness.  Lymphadenopathy:     Cervical: No cervical adenopathy.  Skin:    General: Skin is warm.     Capillary Refill: Capillary refill takes less than 2 seconds.     Coloration: Skin is not pale.     Findings: No abrasion, erythema, petechiae or rash. Rash is not papular, urticarial or vesicular.  Neurological:     Mental Status: He is alert.  Psychiatric:        Behavior: Behavior is cooperative.      Diagnostic studies: none       Malachi Bonds, MD  [facility]

## 2022-07-27 NOTE — Patient Instructions (Addendum)
1. Perennial and seasonal allergic rhinitis (dust mites, dog, cockroach, indoor and outdoor molds, trees, ragweed, and weeds) - Continue with all of your allergy medications.  - It seems that you are doing a good job with your symptoms now.  - Continue with fluticasone EVERY DAY for the best effects. - Continue with nasal ipratropium one spray per nostril EVERY MORNING to help dry out of your nose and prevent the postnasal drip.  - Continue with cetirizine 10mg  EVERY DAY.    2. GERD - Continue with pantoprazole 40mg  daily.   3. Return in about 6 months (around 01/25/2023).    Please inform us of any Emergency Department visits, hospitalizations, or changes in symptoms. Call us before going to the ED for breathing or allergy symptoms since we might be able to fit you in for a sick visit. Feel free to contact us anytime with any questions, problems, or concerns.  It was a pleasure to see you again today!  Websites that have reliable patient information: 1. American Academy of Asthma, Allergy, and Immunology: www.aaaai.org 2. Food Allergy Research and Education (FARE): foodallergy.org 3. Mothers of Asthmatics: http://www.asthmacommunitynetwork.org 4. American College of Allergy, Asthma, and Immunology: www.acaai.org   COVID-19 Vaccine Information can be found at: ShippingScam.co.uk For questions related to vaccine distribution or appointments, please email [email or web address] or call [phone].     "Like" [place] on Facebook and Instagram for our latest updates!      Make sure you are registered to vote! If you have moved or changed any of your contact information, you will need to get this updated before voting!  In some cases, you MAY be able to register to vote online: [email or web address]

## 2022-08-11 ENCOUNTER — Ambulatory Visit: Payer: Medicare PPO | Admitting: Cardiology

## 2022-08-11 NOTE — Progress Notes (Deleted)
Clinical Summary Samuel Wilkerson is a 89 y.o.male former patient of Dr Bronson Ing, seen today for the following medical problems. This is our first visit together.   1.CAD - remote MI in 1993, prior intervention to diag. Subsequent cath 2006 with occluded RCA and 90% D1 treated medically -   2. HTN    3. Hyperlipidemia     Past Medical History:  Diagnosis Date   Coronary artery disease    Hyperlipidemia    Hypertension      No Known Allergies   Current Outpatient Medications  Medication Sig Dispense Refill   Acidophilus Lactobacillus CAPS Take 1 capsule by mouth 2 (two) times daily.     albuterol (PROVENTIL HFA;VENTOLIN HFA) 108 (90 Base) MCG/ACT inhaler Inhale 1-2 puffs into the lungs every 6 (six) hours as needed for wheezing or shortness of breath. 1 Inhaler 0   amLODipine (NORVASC) 10 MG tablet Take 5 mg by mouth daily.     aspirin EC 81 MG tablet Take 81 mg by mouth daily.     cetirizine (ZYRTEC) 10 MG tablet Take 1 tablet (10 mg total) by mouth daily. 90 tablet 1   chlordiazePOXIDE (LIBRIUM) 10 MG capsule Take 20 mg by mouth at bedtime.     Cholecalciferol (VITAMIN D3) 25 MCG (1000 UT) CAPS Take 1,000 Units by mouth daily.      clobetasol cream (TEMOVATE) 0.05 % Apply topically.     diphenhydrAMINE (BENADRYL) 25 mg capsule as needed.     dorzolamide (TRUSOPT) 2 % ophthalmic solution Place 1 drop into both eyes 2 (two) times daily.     dorzolamide (TRUSOPT) 2 % ophthalmic solution Apply to eye.     fluticasone (FLONASE) 50 MCG/ACT nasal spray Place 1 spray into both nostrils daily. 16 g 11   guaiFENesin 200 MG tablet Take 200 mg by mouth 2 (two) times a day.     hydrochlorothiazide (HYDRODIURIL) 50 MG tablet Take 50 mg by mouth daily.     hydrocortisone-pramoxine (PROCTOFOAM-HC) rectal foam Place 1 applicator rectally as needed for hemorrhoids or itching.     hydrophilic ointment Apply topically.     ipratropium (ATROVENT) 0.03 % nasal spray Place 1 spray into  both nostrils every morning. 30 mL 11   latanoprost (XALATAN) 0.005 % ophthalmic solution Apply 1 drop to eye at bedtime.     lisinopril (PRINIVIL,ZESTRIL) 20 MG tablet Take 10 mg by mouth daily.     magic mouthwash w/lidocaine SOLN Take 10 mLs by mouth as directed.     Menthol-Methyl Salicylate (THERA-GESIC) 0.5-15 % CREA Apply topically.     methocarbamol (ROBAXIN) 500 MG tablet Take 500 mg by mouth 3 (three) times daily as needed.     metoprolol (TOPROL-XL) 50 MG 24 hr tablet Take 50 mg by mouth 2 (two) times a day.     metoprolol tartrate (LOPRESSOR) 50 MG tablet Take by mouth.     miconazole (MICOTIN) 2 % cream Apply 1 application topically 2 (two) times daily. 35 g 1   Miconazole Nitrate 2 % KIT Apply topically.     MISC NATURAL PRODUCTS EX Apply topically. Pain relieving cream - as needed for back     montelukast (SINGULAIR) 10 MG tablet Take 1 tablet by mouth daily.     naproxen (NAPROSYN) 375 MG tablet Take 375 mg by mouth 2 (two) times daily with a meal.     nitroGLYCERIN (NITROSTAT) 0.4 MG SL tablet Place 0.4 mg under the tongue every 5 (  five) minutes as needed. For chest pain     pantoprazole (PROTONIX) 40 MG tablet Take 1 tablet (40 mg total) by mouth daily. (Patient not taking: Reported on 07/27/2022) 90 tablet 3   phenylephrine (,USE FOR PREPARATION-H,) 0.25 % suppository Place rectally.     polyethylene glycol (MIRALAX / GLYCOLAX) packet Take 17 g by mouth daily.     polyethylene glycol powder (GLYCOLAX/MIRALAX) 17 GM/SCOOP powder Take by mouth.     potassium chloride (K-DUR) 10 MEQ tablet Take 10 mEq by mouth daily.     potassium chloride (KLOR-CON) 10 MEQ tablet TAKE ONE TABLET BY MOUTH EVERY DAY FOR POTASSIUM REPLACEMENT     rosuvastatin (CRESTOR) 40 MG tablet Take 40 mg by mouth daily.     Skin Protectants, Misc. [facility]) OINT Apply topically.     terbinafine (LAMISIL) 1 % cream Apply topically daily.      triamcinolone cream (KENALOG) 0.5 % Apply 1 application topically  as needed.      No current facility-administered medications for this visit.     Past Surgical History:  Procedure Laterality Date   CARDIAC CATHETERIZATION     colonoscopy     CORONARY ANGIOPLASTY     TONSILLECTOMY       No Known Allergies    Family History  Problem Relation Age of Onset   Diabetes Sister        b/l below knee amputations   Stroke Brother    Heart attack Brother      Social History Samuel Wilkerson reports that he has never smoked. He has never used smokeless tobacco. Samuel Wilkerson reports no history of alcohol use.   Review of Systems CONSTITUTIONAL: No weight loss, fever, chills, weakness or fatigue.  HEENT: Eyes: No visual loss, blurred vision, double vision or yellow sclerae.No hearing loss, sneezing, congestion, runny nose or sore throat.  SKIN: No rash or itching.  CARDIOVASCULAR:  RESPIRATORY: No shortness of breath, cough or sputum.  GASTROINTESTINAL: No anorexia, nausea, vomiting or diarrhea. No abdominal pain or blood.  GENITOURINARY: No burning on urination, no polyuria NEUROLOGICAL: No headache, dizziness, syncope, paralysis, ataxia, numbness or tingling in the extremities. No change in bowel or bladder control.  MUSCULOSKELETAL: No muscle, back pain, joint pain or stiffness.  LYMPHATICS: No enlarged nodes. No history of splenectomy.  PSYCHIATRIC: No history of depression or anxiety.  ENDOCRINOLOGIC: No reports of sweating, cold or heat intolerance. No polyuria or polydipsia.  Marland Kitchen   Physical Examination There were no vitals filed for this visit. There were no vitals filed for this visit.  Gen: resting comfortably, no acute distress HEENT: no scleral icterus, pupils equal round and reactive, no palptable cervical adenopathy,  CV Resp: Clear to auscultation bilaterally GI: abdomen is soft, non-tender, non-distended, normal bowel sounds, no hepatosplenomegaly MSK: extremities are warm, no edema.  Skin: warm, no rash Neuro:  no focal  deficits Psych: appropriate affect   Diagnostic Studies     Assessment and Plan        Samuel Wilkerson, M.D., F.A.C.C.

## 2022-12-22 ENCOUNTER — Other Ambulatory Visit: Payer: Self-pay

## 2022-12-22 ENCOUNTER — Emergency Department (HOSPITAL_COMMUNITY)
Admission: EM | Admit: 2022-12-22 | Discharge: 2022-12-22 | Disposition: A | Discharge status: Home / Self Care | Payer: No Typology Code available for payment source | Attending: Emergency Medicine | Admitting: Emergency Medicine

## 2022-12-22 ENCOUNTER — Encounter (HOSPITAL_COMMUNITY): Payer: Self-pay | Admitting: Emergency Medicine

## 2022-12-22 DIAGNOSIS — S59912A Unspecified injury of left forearm, initial encounter: Secondary | ICD-10-CM | POA: Diagnosis present

## 2022-12-22 DIAGNOSIS — I10 Essential (primary) hypertension: Secondary | ICD-10-CM | POA: Diagnosis not present

## 2022-12-22 DIAGNOSIS — S46219A Strain of muscle, fascia and tendon of other parts of biceps, unspecified arm, initial encounter: Secondary | ICD-10-CM

## 2022-12-22 DIAGNOSIS — Z7982 Long term (current) use of aspirin: Secondary | ICD-10-CM | POA: Diagnosis not present

## 2022-12-22 DIAGNOSIS — I2510 Atherosclerotic heart disease of native coronary artery without angina pectoris: Secondary | ICD-10-CM | POA: Diagnosis not present

## 2022-12-22 DIAGNOSIS — S46122A Laceration of muscle, fascia and tendon of long head of biceps, left arm, initial encounter: Secondary | ICD-10-CM | POA: Insufficient documentation

## 2022-12-22 DIAGNOSIS — Z79899 Other long term (current) drug therapy: Secondary | ICD-10-CM | POA: Diagnosis not present

## 2022-12-22 DIAGNOSIS — X58XXXA Exposure to other specified factors, initial encounter: Secondary | ICD-10-CM | POA: Diagnosis not present

## 2022-12-22 MED ORDER — DICLOFENAC SODIUM 1 % EX GEL: 2.0000 g | Freq: Four times a day (QID) | CUTANEOUS | 0 refills | Status: AC

## 2022-12-22 NOTE — ED Notes (Signed)
dC instructions reviewed with pt. PT verbalized understanding.  PT DC.  

## 2022-12-22 NOTE — ED Triage Notes (Signed)
Pt came in POV for left arm pain.  Pt suspects pulled muscle while moving something heavy last Wed.  There is a bruise noted to lower bicep.

## 2022-12-22 NOTE — Discharge Instructions (Signed)
Rest and ice left arm.  You will need some physical therapy, so call Dr. Mort Sawyers office when you get home to make an appointment.

## 2022-12-22 NOTE — ED Provider Notes (Signed)
[place] EMERGENCY DEPARTMENT AT [facility] Provider Note   CSN: [id number] Arrival date & time: 12/22/22  0757     History  Chief Complaint  Patient presents with   L arm pain    KOHLE BIAGIOTTI is a 89 y.o. male.  Pt is a 89 yo male with pmhx significant for htn, cad, and hld.  Pt said he was lifting something heavy on 6/5.  He went to the ED in [place], but he said they could not tell him what was wrong.  He noticed some bruising in his arm today and was concerned and thought he'd come in for further eval.         Home Medications Prior to Admission medications   Medication Sig Start Date End Date Taking? Authorizing Provider  diclofenac Sodium (VOLTAREN) 1 % GEL Apply 2 g topically 4 (four) times daily. 12/22/22  Yes Jacalyn Lefevre, MD  Acidophilus Lactobacillus CAPS Take 1 capsule by mouth 2 (two) times daily. 03/26/21   [provider]  albuterol (PROVENTIL HFA;VENTOLIN HFA) 108 (90 Base) MCG/ACT inhaler Inhale 1-2 puffs into the lungs every 6 (six) hours as needed for wheezing or shortness of breath. 07/10/16   Ward, Layla Maw, DO  amLODipine (NORVASC) 10 MG tablet Take 5 mg by mouth daily.    [provider]  aspirin EC 81 MG tablet Take 81 mg by mouth daily.    [provider]  cetirizine (ZYRTEC) 10 MG tablet Take 1 tablet (10 mg total) by mouth daily. 07/27/22   Alfonse Spruce, MD  chlordiazePOXIDE (LIBRIUM) 10 MG capsule Take 20 mg by mouth at bedtime.    [provider]  Cholecalciferol (VITAMIN D3) 25 MCG (1000 UT) CAPS Take 1,000 Units by mouth daily.     [provider]  clobetasol cream (TEMOVATE) 0.05 % Apply topically. 09/02/20   [provider]  diphenhydrAMINE (BENADRYL) 25 mg capsule as needed. 11/03/20   [provider]  dorzolamide (TRUSOPT) 2 % ophthalmic solution Place 1 drop into both eyes 2 (two) times daily.    [provider]  dorzolamide (TRUSOPT) 2 % ophthalmic  solution Apply to eye. 01/07/21   [provider]  fluticasone (FLONASE) 50 MCG/ACT nasal spray Place 1 spray into both nostrils daily. 07/27/22   Alfonse Spruce, MD  guaiFENesin 200 MG tablet Take 200 mg by mouth 2 (two) times a day.    [provider]  hydrochlorothiazide (HYDRODIURIL) 50 MG tablet Take 50 mg by mouth daily.    [provider]  hydrocortisone-pramoxine [facility]) rectal foam Place 1 applicator rectally as needed for hemorrhoids or itching.    [provider]  hydrophilic ointment Apply topically.    [provider]  ipratropium (ATROVENT) 0.03 % nasal spray Place 1 spray into both nostrils every morning. 07/27/22   Alfonse Spruce, MD  latanoprost (XALATAN) 0.005 % ophthalmic solution Apply 1 drop to eye at bedtime. 09/02/20   [provider]  lisinopril (PRINIVIL,ZESTRIL) 20 MG tablet Take 10 mg by mouth daily.    [provider]  magic mouthwash w/lidocaine SOLN Take 10 mLs by mouth as directed. 03/31/21   [provider]  Menthol-Methyl Salicylate (THERA-GESIC) 0.5-15 % CREA Apply topically. 09/02/20   [provider]  methocarbamol (ROBAXIN) 500 MG tablet Take 500 mg by mouth 3 (three) times daily as needed. 04/22/22   [provider]  metoprolol (TOPROL-XL) 50 MG 24 hr tablet Take 50 mg by  mouth 2 (two) times a day.    [provider]  metoprolol tartrate (LOPRESSOR) 50 MG tablet Take by mouth. 12/25/19   [provider]  miconazole (MICOTIN) 2 % cream Apply 1 application topically 2 (two) times daily. 12/19/19   Freddie Breech, DPM  Miconazole Nitrate 2 % KIT Apply topically.    [provider]  MISC NATURAL PRODUCTS EX Apply topically. Pain relieving cream - as needed for back    [provider]  montelukast (SINGULAIR) 10 MG tablet Take 1 tablet by mouth daily. 12/09/20   [provider]  naproxen (NAPROSYN) 375 MG tablet Take  375 mg by mouth 2 (two) times daily with a meal.    [provider]  nitroGLYCERIN (NITROSTAT) 0.4 MG SL tablet Place 0.4 mg under the tongue every 5 (five) minutes as needed. For chest pain    [provider]  pantoprazole (PROTONIX) 40 MG tablet Take 1 tablet (40 mg total) by mouth daily. Patient not taking: Reported on 07/27/2022 05/04/22 08/02/22  Alfonse Spruce, MD  phenylephrine (,USE FOR PREPARATION-H,) 0.25 % suppository Place rectally. 05/05/20   [provider]  polyethylene glycol (MIRALAX / GLYCOLAX) packet Take 17 g by mouth daily.    [provider]  polyethylene glycol powder (GLYCOLAX/MIRALAX) 17 GM/SCOOP powder Take by mouth. 01/07/21   [provider]  potassium chloride (K-DUR) 10 MEQ tablet Take 10 mEq by mouth daily.    [provider]  potassium chloride (KLOR-CON) 10 MEQ tablet TAKE ONE TABLET BY MOUTH EVERY DAY FOR POTASSIUM REPLACEMENT 09/02/20   [provider]  rosuvastatin (CRESTOR) 40 MG tablet Take 40 mg by mouth daily.    [provider]  Skin Protectants, Misc. [facility]) OINT Apply topically.    [provider]  terbinafine (LAMISIL) 1 % cream Apply topically daily.     [provider]  triamcinolone cream (KENALOG) 0.5 % Apply 1 application topically as needed.     [provider]      Allergies    Patient has no known allergies.    Review of Systems   Review of Systems  Musculoskeletal:        Left arm pain  All other systems reviewed and are negative.   Physical Exam Updated Vital Signs BP (!) 141/82 (BP Location: Right Arm)   Pulse 68   Temp (!) 97.4 F (36.3 C) (Oral)   Resp 16   Ht 6\' 2"  (1.88 m)   Wt 86.2 kg   SpO2 100%   BMI 24.39 kg/m  Physical Exam Vitals and nursing note reviewed.  Constitutional:      Appearance: Normal appearance.  HENT:     Head: Normocephalic and atraumatic.     Right Ear: External ear normal.     Left Ear:  External ear normal.     Nose: Nose normal.     Mouth/Throat:     Mouth: Mucous membranes are moist.     Pharynx: Oropharynx is clear.  Eyes:     Extraocular Movements: Extraocular movements intact.     Conjunctiva/sclera: Conjunctivae normal.     Pupils: Pupils are equal, round, and reactive to light.  Cardiovascular:     Rate and Rhythm: Normal rate and regular rhythm.     Pulses: Normal pulses.     Heart sounds: Normal heart sounds.  Pulmonary:     Effort: Pulmonary effort is normal.     Breath sounds: Normal breath sounds.  Abdominal:  General: Abdomen is flat. Bowel sounds are normal.     Palpations: Abdomen is soft.  Musculoskeletal:     Cervical back: Normal range of motion and neck supple.     Comments: Bruising in AC fossa.  Pt is able to flex bicep, but one can feel a defect and there is a deformity of the biceps muscle.  Pt has mild tenderness to the biceps tendon location.  I suspect partial biceps tendon rupture.  Skin:    General: Skin is warm.     Capillary Refill: Capillary refill takes less than 2 seconds.  Neurological:     General: No focal deficit present.     Mental Status: He is alert and oriented to person, place, and time.  Psychiatric:        Mood and Affect: Mood normal.        Behavior: Behavior normal.     ED Results / Procedures / Treatments   Labs (all labs ordered are listed, but only abnormal results are displayed) Labs Reviewed - No data to display  EKG None  Radiology No results found.  Procedures Procedures    Medications Ordered in ED Medications - No data to display  ED Course/ Medical Decision Making/ A&P                             Medical Decision Making  This patient presents to the ED for concern of arm pain, this involves an extensive number of treatment options, and is a complaint that carries with it a high risk of complications and morbidity.  The differential diagnosis includes dvt, fx, tendonitis, tendon  tear   Co morbidities that complicate the patient evaluation  htn, cad, and hld   Additional history obtained:  Additional history obtained from epic chart review  Medicines ordered and prescription drug management:   I have reviewed the patients home medicines and have made adjustments as needed   Problem List / ED Course:  Partial biceps tendon repair:  I doubt fx.  Pt has no bony tenderness.  Pt did have an x[facility] ED.  I can't see it, but I think they would have told him if it were fractured.  I don't think we need to do another x-ray.  Clinically, pt has a partially torn biceps tendon.  It is unlikely that he will be a surgical candidate, but would benefit from PT.  He lives in [place] and prefers an orthopedist up there, so I have given him Dr. Mort Sawyers number.  Pt is stable for d/c.  Return if worse.   Social Determinants of Health:  Lives at home   Dispostion:  After consideration of the diagnostic results and the patients response to treatment, I feel that the patent would benefit from discharge with outpatient f/u.          Final Clinical Impression(s) / ED Diagnoses Final diagnoses:  Biceps tendon tear    Rx / DC Orders ED Discharge Orders          Ordered    diclofenac Sodium (VOLTAREN) 1 % GEL  4 times daily        12/22/22 0831              Jacalyn Lefevre, MD 12/22/22 0840

## 2023-03-31 ENCOUNTER — Telehealth: Payer: Self-pay

## 2023-03-31 MED ORDER — PANTOPRAZOLE SODIUM 40 MG PO TBEC: 40.0000 mg | DELAYED_RELEASE_TABLET | Freq: Every day | ORAL | 0 refills | Status: DC

## 2023-03-31 NOTE — Telephone Encounter (Signed)
Patient stopped by needing a refill on his pantoprazole.  [facility] [address]

## 2023-03-31 NOTE — Telephone Encounter (Signed)
Pantoprazole has been sent. I called patient and left a message to call the office back to inform.

## 2023-04-03 NOTE — Telephone Encounter (Signed)
I called patient and left a message to call the office back to inform.

## 2023-04-04 NOTE — Telephone Encounter (Signed)
I called patient and left a message to call the office back.

## 2023-04-07 NOTE — Telephone Encounter (Signed)
Called and left a voicemail on patient's home number asking for return call to inform.

## 2023-04-11 NOTE — Telephone Encounter (Signed)
I called patient and informed that Protonix has been sent into the [facility] in [place] on [address]

## 2023-05-23 NOTE — Progress Notes (Unsigned)
   [address] [place] [address] Dept: [phone]  FOLLOW UP NOTE  Patient ID: Samuel Wilkerson, male    DOB: [birth year]  Age: 89 y.o. MRN: [id number] Date of Office Visit: 05/24/2023  Assessment  Chief Complaint: No chief complaint on file.  HPI Samuel Wilkerson is a 89 year old male who presents to the clinic for a follow up visit. He was last seen in this clinic on 07/27/2022 by Dr. Dellis Anes for evaluation of allergic rhinitis, angioedema with a negative workup, and reflux. He has a history of   Discussed the use of AI scribe software for clinical note transcription with the patient, who gave verbal consent to proceed.  History of Present Illness             Drug Allergies:  No Known Allergies  Physical Exam: There were no vitals taken for this visit.   Physical Exam  Diagnostics:    Assessment and Plan: No diagnosis found.  No orders of the defined types were placed in this encounter.   There are no Patient Instructions on file for this visit.  No follow-ups on file.    Thank you for the opportunity to care for this patient.  Please do not hesitate to contact me with questions.  Thermon Leyland, FNP [facility]

## 2023-05-23 NOTE — Patient Instructions (Incomplete)
Allergic rhinitis Continue allergen avoidance measures directed toward tree pollen, weed pollen, ragweed pollen, mold, dust mite, dog, and cockroach as listed below Continue Flonase 2 sprays in each nostril once a day as needed for stuffy nose Continue ipratropium 2 sprays in each nostril 2-3 times a day as needed for runny nose Consider saline nasal rinses as needed for nasal symptoms. Use this before any medicated nasal sprays for best result  Reflux Continue dietary lifestyle modifications as listed below Continue pantoprazole 40 mg once a day.  Take this medication 30 to 60 minutes before your first meal for best effect.  Angioedema/parotitis Continue to follow-up with your ENT specialist as recommended  Right lung nodule Continue follow-up with your pulmonologist as recommended  Call the clinic if this treatment plan is not working well for you.  Follow up in 6 months or sooner if needed.  Reducing Pollen Exposure The American Academy of Allergy, Asthma and Immunology suggests the following steps to reduce your exposure to pollen during allergy seasons. Do not hang sheets or clothing out to dry; pollen may collect on these items. Do not mow lawns or spend time around freshly cut grass; mowing stirs up pollen. Keep windows closed at night.  Keep car windows closed while driving. Minimize morning activities outdoors, a time when pollen counts are usually at their highest. Stay indoors as much as possible when pollen counts or humidity is high and on windy days when pollen tends to remain in the air longer. Use air conditioning when possible.  Many air conditioners have filters that trap the pollen spores. Use a HEPA room air filter to remove pollen form the indoor air you breathe.  Control of Mold Allergen Mold and fungi can grow on a variety of surfaces provided certain temperature and moisture conditions exist.  Outdoor molds grow on plants, decaying vegetation and soil.  The  major outdoor mold, Alternaria and Cladosporium, are found in very high numbers during hot and dry conditions.  Generally, a late Summer - Fall peak is seen for common outdoor fungal spores.  Rain will temporarily lower outdoor mold spore count, but counts rise rapidly when the rainy period ends.  The most important indoor molds are Aspergillus and Penicillium.  Dark, humid and poorly ventilated basements are ideal sites for mold growth.  The next most common sites of mold growth are the bathroom and the kitchen.  Outdoor [employer] Use air conditioning and keep windows closed Avoid exposure to decaying vegetation. Avoid leaf raking. Avoid grain handling. Consider wearing a face mask if working in moldy areas.  Indoor Mold Control Maintain humidity below 50%. Clean washable surfaces with 5% bleach solution. Remove sources e.g. Contaminated carpets.   Control of Dust Mite Allergen Dust mites play a major role in allergic asthma and rhinitis. They occur in environments with high humidity wherever human skin is found. Dust mites absorb humidity from the atmosphere (ie, they do not drink) and feed on organic matter (including shed human and animal skin). Dust mites are a microscopic type of insect that you cannot see with the naked eye. High levels of dust mites have been detected from mattresses, pillows, carpets, upholstered furniture, bed covers, clothes, soft toys and any woven material. The principal allergen of the dust mite is found in its feces. A gram of dust may contain 1,000 mites and 250,000 fecal particles. Mite antigen is easily measured in the air during house cleaning activities. Dust mites do not bite and do not cause  harm to humans, other than by triggering allergies/asthma.  Ways to decrease your exposure to dust mites in your home:  1. Encase mattresses, box springs and pillows with a mite-impermeable barrier or cover  2. Wash sheets, blankets and drapes weekly in hot water  (130 F) with detergent and dry them in a dryer on the hot setting.  3. Have the room cleaned frequently with a vacuum cleaner and a damp dust-mop. For carpeting or rugs, vacuuming with a vacuum cleaner equipped with a high-efficiency particulate air (HEPA) filter. The dust mite allergic individual should not be in a room which is being cleaned and should wait 1 hour after cleaning before going into the room.  4. Do not sleep on upholstered furniture (eg, couches).  5. If possible removing carpeting, upholstered furniture and drapery from the home is ideal. Horizontal blinds should be eliminated in the rooms where the person spends the most time (bedroom, study, television room). Washable vinyl, roller-type shades are optimal.  6. Remove all non-washable stuffed toys from the bedroom. Wash stuffed toys weekly like sheets and blankets above.  7. Reduce indoor humidity to less than 50%. Inexpensive humidity monitors can be purchased at most hardware stores. Do not use a humidifier as can make the problem worse and are not recommended.  Control of Dog or Cat Allergen Avoidance is the best way to manage a dog or cat allergy. If you have a dog or cat and are allergic to dog or cats, consider removing the dog or cat from the home. If you have a dog or cat but don't want to find it a new home, or if your family wants a pet even though someone in the household is allergic, here are some strategies that may help keep symptoms at bay:  Keep the pet out of your bedroom and restrict it to only a few rooms. Be advised that keeping the dog or cat in only one room will not limit the allergens to that room. Don't pet, hug or kiss the dog or cat; if you do, wash your hands with soap and water. High-efficiency particulate air (HEPA) cleaners run continuously in a bedroom or living room can reduce allergen levels over time. Regular use of a high-efficiency vacuum cleaner or a central vacuum can reduce allergen  levels. Giving your dog or cat a bath at least once a week can reduce airborne allergen.  Control of Cockroach Allergen Cockroach allergen has been identified as an important cause of acute attacks of asthma, especially in urban settings.  There are fifty-five species of cockroach that exist in the [place], however only three, the [place], [place] species produce allergen that can affect patients with Asthma.  Allergens can be obtained from fecal particles, egg casings and secretions from cockroaches.    Remove food sources. Reduce access to water. Seal access and entry points. Spray runways with 0.5-1% Diazinon or Chlorpyrifos Blow boric acid power under stoves and refrigerator. Place bait stations (hydramethylnon) at feeding sites.

## 2023-05-24 ENCOUNTER — Ambulatory Visit (INDEPENDENT_AMBULATORY_CARE_PROVIDER_SITE_OTHER): Payer: No Typology Code available for payment source | Admitting: Family Medicine

## 2023-05-24 ENCOUNTER — Encounter: Payer: Self-pay | Admitting: Family Medicine

## 2023-05-24 VITALS — BP 124/64 | HR 63 | Temp 98.3°F | Resp 14 | Ht 75.0 in | Wt 191.8 lb

## 2023-05-24 DIAGNOSIS — J3089 Other allergic rhinitis: Secondary | ICD-10-CM

## 2023-05-24 DIAGNOSIS — J302 Other seasonal allergic rhinitis: Secondary | ICD-10-CM

## 2023-05-24 DIAGNOSIS — R220 Localized swelling, mass and lump, head: Secondary | ICD-10-CM | POA: Diagnosis not present

## 2023-05-24 DIAGNOSIS — K219 Gastro-esophageal reflux disease without esophagitis: Secondary | ICD-10-CM | POA: Diagnosis not present

## 2023-05-24 MED ORDER — FLUTICASONE PROPIONATE 50 MCG/ACT NA SUSP: 1.0000 | Freq: Every day | NASAL | 11 refills | Status: DC

## 2023-05-24 MED ORDER — PANTOPRAZOLE SODIUM 40 MG PO TBEC: 40.0000 mg | DELAYED_RELEASE_TABLET | Freq: Every day | ORAL | 5 refills | Status: DC

## 2023-05-24 MED ORDER — IPRATROPIUM BROMIDE 0.03 % NA SOLN: 1.0000 | Freq: Every morning | NASAL | 11 refills | Status: DC

## 2023-07-11 ENCOUNTER — Telehealth: Payer: Self-pay

## 2023-07-11 NOTE — Telephone Encounter (Signed)
New [facility] auth request has been sent to [facility] for review. Patients next visit is in May of 2025. I have requested in between visits and coverage for his next visit.  Patient is aware.

## 2023-07-26 ENCOUNTER — Ambulatory Visit: Payer: No Typology Code available for payment source | Admitting: Family Medicine

## 2023-11-22 ENCOUNTER — Ambulatory Visit: Payer: No Typology Code available for payment source | Admitting: Allergy & Immunology

## 2023-12-27 ENCOUNTER — Ambulatory Visit (INDEPENDENT_AMBULATORY_CARE_PROVIDER_SITE_OTHER): Admitting: Allergy & Immunology

## 2023-12-27 ENCOUNTER — Encounter: Payer: Self-pay | Admitting: Allergy & Immunology

## 2023-12-27 VITALS — BP 130/68 | HR 76 | Temp 98.0°F | Resp 14 | Wt 190.1 lb

## 2023-12-27 DIAGNOSIS — J3089 Other allergic rhinitis: Secondary | ICD-10-CM | POA: Diagnosis not present

## 2023-12-27 DIAGNOSIS — J302 Other seasonal allergic rhinitis: Secondary | ICD-10-CM

## 2023-12-27 DIAGNOSIS — R220 Localized swelling, mass and lump, head: Secondary | ICD-10-CM

## 2023-12-27 DIAGNOSIS — K219 Gastro-esophageal reflux disease without esophagitis: Secondary | ICD-10-CM | POA: Diagnosis not present

## 2023-12-27 MED ORDER — FLUTICASONE PROPIONATE 50 MCG/ACT NA SUSP: 1.0000 | Freq: Every day | NASAL | 5 refills | Status: DC

## 2023-12-27 MED ORDER — IPRATROPIUM BROMIDE 0.03 % NA SOLN: 1.0000 | Freq: Every morning | NASAL | 5 refills | Status: DC

## 2023-12-27 MED ORDER — PANTOPRAZOLE SODIUM 40 MG PO TBEC: 40.0000 mg | DELAYED_RELEASE_TABLET | Freq: Every day | ORAL | 5 refills | Status: AC

## 2023-12-27 MED ORDER — PANTOPRAZOLE SODIUM 40 MG PO TBEC: 40.0000 mg | DELAYED_RELEASE_TABLET | Freq: Every day | ORAL | 5 refills | Status: DC

## 2023-12-27 NOTE — Patient Instructions (Addendum)
 Allergic rhinitis - well controlled Continue allergen avoidance measures directed toward tree pollen, weed pollen, ragweed pollen, mold, dust mite, dog, and cockroach as listed below Continue Flonase  2 sprays in each nostril once a day as needed for stuffy nose Continue ipratropium 2 sprays in each nostril 2-3 times a day as needed for runny nose Consider saline nasal rinses as needed for nasal symptoms. Use this before any medicated nasal sprays for best result  Reflux Continue dietary lifestyle modifications as listed below Continue pantoprazole  40 mg once a day before a meal.    Angioedema/parotitis Continue to follow-up with your ENT specialist as recommended.   Right lung nodule Continue follow-up with your pulmonologist as recommended.  Return in about 6 months (around 06/27/2024). You can have the follow up appointment with Dr. Idolina Maker or a Nurse Practicioner (our Nurse Practitioners are excellent and always have Physician oversight!).    Please inform us  of any Emergency Department visits, hospitalizations, or changes in symptoms. Call us  before going to the ED for breathing or allergy symptoms since we might be able to fit you in for a sick visit. Feel free to contact us  anytime with any questions, problems, or concerns.  It was a pleasure to see you again today!  Websites that have reliable patient information: 1. American Academy of Asthma, Allergy, and Immunology: www.aaaai.org 2. Food Allergy Research and Education (FARE): foodallergy.org 3. Mothers of Asthmatics: http://www.asthmacommunitynetwork.org 4. American College of Allergy, Asthma, and Immunology: www.acaai.org      "Like" us  on Facebook and Instagram for our latest updates!      A healthy democracy works best when [employer] participate! Make sure you are registered to vote! If you have moved or changed any of your contact information, you will need to get this updated before voting! Scan the QR codes  below to learn more!

## 2023-12-27 NOTE — Progress Notes (Signed)
 FOLLOW UP  Date of Service/Encounter:  12/27/23   Assessment:   Recurrent pharyngitis - ? GERD   Perennial and seasonal allergic rhinitis (dust mites, dog, cockroach, indoor and outdoor molds, trees, ragweed, and weeds) - well controlled    Facial swelling - unknown etiology but normal imaging and labs    Chest CT with 6 mm subpleural right upper lobe nodule (stable)  Plan/Recommendations:   Allergic rhinitis - well controlled Continue allergen avoidance measures directed toward tree pollen, weed pollen, ragweed pollen, mold, dust mite, dog, and cockroach as listed below Continue Flonase  2 sprays in each nostril once a day as needed for stuffy nose Continue ipratropium 2 sprays in each nostril 2-3 times a day as needed for runny nose Consider saline nasal rinses as needed for nasal symptoms. Use this before any medicated nasal sprays for best result  Reflux Continue dietary lifestyle modifications as listed below Continue pantoprazole  40 mg once a day before a meal.    Angioedema/parotitis Continue to follow-up with your ENT specialist as recommended.   Right lung nodule Continue follow-up with your pulmonologist as recommended.  Return in about 6 months (around 06/27/2024). You can have the follow up appointment with Dr. Idolina Maker or a Nurse Practicioner (our Nurse Practitioners are excellent and always have Physician oversight!).   Subjective:   Samuel Wilkerson is a 89 y.o. male presenting today for follow up of  Chief Complaint  Patient presents with   Follow-up    Is still having sinus issues.     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   Onychomycosis due to dermatophyte 01/08/2021   Person with feared health complaint in whom no diagnosis is made 01/08/2021   Presbyopia 01/08/2021   Sensorineural hearing loss, bilateral 01/08/2021   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  Discussed the use of AI scribe software for clinical note transcription with the patient and/or guardian, who gave verbal consent to proceed.  Tenzin is a 89 y.o. male presenting for a follow up visit. He was last seen in November 2024 by Marinus Sic. At that time, we continued with Flonase  as well as Atrovent . For his reflux, we continued with the use of Protonix . HE was also seeing ENT for management of his parotitis.   Since the last visit, he has done relatively well.   Asthma/Respiratory Symptom History: His respiratory health is stable with no recent pneumonia or infections. He has lung nodules under observation. No coughing or chest pain is present. His breathing is reported as good.  Allergic Rhinitis Symptom History: The nasal spray he uses is effective.   He has a persistent issue with jaw swelling for nearly two years. Antibiotics provide temporary relief, but symptoms recur post-treatment. Multiple specialists, including ENT doctors, have been consulted without identifying a  definitive cause.  GERD Symptom History: No heartburn or runny nose is reported. He is going well with the PPI only. He has hernias but did not wear his support today as he forgot.  Socially, he is still driving and travels to [facility] for medical appointments. He is independent in his activities.   He has a pulled muscle in his left arm, treated with a topical medication applied twice daily for the past five or six days. This medication has been used for over a year for other issues and is effective in managing symptoms.  Otherwise, there have been no changes to his past medical history, surgical history, family history, or social history.    Review of systems otherwise negative other than that mentioned in the HPI.    Objective:   Blood pressure 130/68, pulse 76, temperature 98 F (36.7 C), resp. rate 14, weight 190 lb 2 oz (86.2 kg), SpO2 97%. Body mass index is 23.76 kg/m.    Physical Exam Vitals reviewed.  Constitutional:      Appearance: He is well-developed.  HENT:     Head: Normocephalic and atraumatic.     Comments: I did not appreciate any facial swelling today. He is hard of hearing which is his usual. There was no tenderness noted at all.    Right Ear: Tympanic membrane, ear canal and external ear normal.     Left Ear: Tympanic membrane, ear canal and external ear normal.     Nose: No nasal deformity, septal deviation, mucosal edema or rhinorrhea.     Right Turbinates: Enlarged, swollen and pale.     Left Turbinates: Enlarged, swollen and pale.     Right Sinus: No maxillary sinus tenderness or frontal sinus tenderness.     Left Sinus: No maxillary sinus tenderness or frontal sinus tenderness.     Comments: No polyps noted.     Mouth/Throat:     Lips: Pink.     Mouth: Mucous membranes are moist. Mucous membranes are not pale and not dry.     Pharynx: Uvula midline.   Eyes:     General: Lids are normal. Allergic shiner present.        Right eye: No  discharge.        Left eye: No discharge.     Conjunctiva/sclera: Conjunctivae normal.     Right eye: Right conjunctiva is not injected. No chemosis.    Left eye: Left conjunctiva is not injected. No chemosis.    Pupils: Pupils are equal, round, and reactive to light.    Cardiovascular:     Rate and Rhythm: Normal rate and regular rhythm.     Heart sounds: Normal heart sounds.  Pulmonary:     Effort: Pulmonary effort is normal. No tachypnea, accessory muscle usage or respiratory distress.     Breath sounds: Normal breath sounds. No wheezing, rhonchi or rales.     Comments: Moving air well in all lung fields. No increased work of breathing noted.  Chest:     Chest wall: No tenderness.  Lymphadenopathy:     Cervical: No cervical adenopathy.   Skin:    General: Skin is warm.     Capillary Refill: Capillary refill takes less than 2 seconds.  Coloration: Skin is not pale.     Findings: No abrasion, erythema, petechiae or rash. Rash is not papular, urticarial or vesicular.     Comments: No eczematous or urticarial lesions noted.    Neurological:     Mental Status: He is alert.   Psychiatric:        Behavior: Behavior is cooperative.      Diagnostic studies: none       Drexel Gentles, MD  [facility] 

## 2024-01-03 ENCOUNTER — Ambulatory Visit
Admission: EM | Admit: 2024-01-03 | Discharge: 2024-01-03 | Disposition: A | Discharge status: Home / Self Care | Attending: Nurse Practitioner | Admitting: Nurse Practitioner

## 2024-01-03 DIAGNOSIS — K0889 Other specified disorders of teeth and supporting structures: Secondary | ICD-10-CM | POA: Diagnosis not present

## 2024-01-03 DIAGNOSIS — R21 Rash and other nonspecific skin eruption: Secondary | ICD-10-CM | POA: Diagnosis not present

## 2024-01-03 MED ORDER — TRIAMCINOLONE ACETONIDE 0.1 % EX OINT: 1.0000 | TOPICAL_OINTMENT | Freq: Two times a day (BID) | CUTANEOUS | 0 refills | Status: DC

## 2024-01-03 MED ORDER — AMOXICILLIN-POT CLAVULANATE 875-125 MG PO TABS: 1.0000 | ORAL_TABLET | Freq: Two times a day (BID) | ORAL | 0 refills | Status: AC

## 2024-01-03 NOTE — ED Provider Notes (Signed)
 [facility]    CSN: [id number] Arrival date & time: 01/03/24  0902      History   Chief Complaint Chief Complaint  Patient presents with   Dental Pain    HPI Samuel Wilkerson is a 89 y.o. male.   Patient presents today for left arm rash and dental pain.  He reports the rash is little bit itchy and he has been applying diclofenac  gel without relief.  He has been using diclofenac  gel on his left upper arm for torn bicep and thought he could use it on the rash as well.  No oozing or drainage from the rash.  No fever or nausea/vomiting.  Patient also concerned about right lower dental pain.  Reports he has a wisdom tooth that is infected.  He was treated with penicillin  recently and he does not think he was treated long enough no fevers, nausea/vomiting, change in appetite.  He endorses swollen lymph node under his jaw intermittently.  He has a dentist that he follows up closely with.    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   Anxiety 01/08/2021   Atypical facial pain 01/08/2021   Bilateral pseudophakia 01/08/2021   Colonic polyp 01/08/2021   Encounter for fitting and adjustment of hearing aid 01/08/2021   Encounter for immunization 01/08/2021   Idiopathic urticaria 01/08/2021   Jaw pain 01/08/2021   Lens replaced by other means 01/08/2021   Facial swelling 01/08/2021   Low back pain, unspecified 01/08/2021   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   Onychomycosis due to dermatophyte 01/08/2021   Person with feared health complaint in whom no diagnosis is made 01/08/2021   Presbyopia 01/08/2021   Sensorineural hearing loss, bilateral 01/08/2021   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   Swelling of body region 01/08/2021   Xerosis cutis 01/08/2021   Clavi  12/04/2020   Soft tissue lesion 10/09/2020   Hyperlipidemia 11/26/2018   CAD (coronary artery disease), native coronary artery 11/26/2018   Hypertension 11/26/2018   Seasonal and perennial allergic rhinitis 11/07/2018   Secondary cataract 05/22/2018   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 Authorizing Provider  amoxicillin -clavulanate (AUGMENTIN ) 875-125 MG tablet Take 1 tablet by mouth 2 (two) times daily for 7 days. 01/03/24 01/10/24 Yes Chandra Raisin A, NP  triamcinolone ointment (KENALOG) 0.1 % Apply 1 Application topically 2 (two) times daily. 01/03/24  Yes Chandra Raisin LABOR, NP  Acidophilus Lactobacillus CAPS Take 1 capsule by mouth 2 (two) times daily. 03/26/21   [provider]  albuterol  (PROVENTIL  HFA;VENTOLIN  HFA) 108 (90 Base) MCG/ACT inhaler Inhale 1-2 puffs into the lungs every 6 (six) hours as needed for wheezing or shortness of breath. 07/10/16   Ward, Josette SAILOR, DO  amLODipine (NORVASC) 10 MG tablet Take 5 mg by mouth daily.    [provider]  aspirin EC 81 MG tablet Take 81 mg by mouth daily.    [provider]  cetirizine  (ZYRTEC ) 10 MG tablet Take 1  tablet (10 mg total) by mouth daily. 07/27/22   Iva Marty Saltness, MD  chlordiazePOXIDE  (LIBRIUM ) 10 MG capsule Take 20 mg by mouth at bedtime.    [provider]  Cholecalciferol (VITAMIN D3) 25 MCG (1000 UT) CAPS Take 1,000 Units by mouth daily.     [provider]  clobetasol cream (TEMOVATE) 0.05 % Apply topically. 09/02/20   [provider]  diclofenac  Sodium (VOLTAREN ) 1 % GEL Apply 2 g topically 4 (four) times daily. 12/22/22   Haviland, Julie, MD  diphenhydrAMINE (BENADRYL) 25 mg capsule as needed. 11/03/20    [provider]  dorzolamide (TRUSOPT) 2 % ophthalmic solution Place 1 drop into both eyes 2 (two) times daily.    [provider]  dorzolamide (TRUSOPT) 2 % ophthalmic solution Apply to eye. 01/07/21   [provider]  fluticasone  (FLONASE ) 50 MCG/ACT nasal spray Place 1 spray into both nostrils daily. 12/27/23   Iva Marty Saltness, MD  guaiFENesin  200 MG tablet Take 200 mg by mouth 2 (two) times a day.    [provider]  hydrochlorothiazide (HYDRODIURIL) 50 MG tablet Take 50 mg by mouth daily.    [provider]  hydrocortisone-pramoxine [facility]) rectal foam Place 1 applicator rectally as needed for hemorrhoids or itching.    [provider]  hydrophilic ointment Apply topically.    [provider]  ipratropium (ATROVENT ) 0.03 % nasal spray Place 1 spray into both nostrils every morning. 12/27/23   Iva Marty Saltness, MD  latanoprost (XALATAN) 0.005 % ophthalmic solution Apply 1 drop to eye at bedtime. 09/02/20   [provider]  lisinopril (PRINIVIL,ZESTRIL) 20 MG tablet Take 10 mg by mouth daily.    [provider]  magic mouthwash w/lidocaine SOLN Take 10 mLs by mouth as directed. 03/31/21   [provider]  Menthol-Methyl Salicylate (THERA-GESIC) 0.5-15 % CREA Apply topically. 09/02/20   [provider]  methocarbamol (ROBAXIN) 500 MG tablet Take 500 mg by mouth 3 (three) times daily as needed. 04/22/22   [provider]  metoprolol (TOPROL-XL) 50 MG 24 hr tablet Take 50 mg by mouth 2 (two) times a day.    [provider]  metoprolol tartrate (LOPRESSOR) 50 MG tablet Take by mouth. 12/25/19   [provider]  miconazole  (MICOTIN) 2 % cream Apply 1 application topically 2 (two) times daily. 12/19/19   Galaway, Jennifer L, DPM  Miconazole  Nitrate 2 % KIT Apply topically.    [provider]  MISC NATURAL PRODUCTS EX Apply topically. Pain relieving cream -  as needed for back    [provider]  montelukast (SINGULAIR) 10 MG tablet Take 1 tablet by mouth daily. 12/09/20   [provider]  naproxen (NAPROSYN) 375 MG tablet Take 375 mg by mouth 2 (two) times daily with a meal.    [provider]  nitroGLYCERIN (NITROSTAT) 0.4 MG SL tablet Place 0.4 mg under the tongue every 5 (five) minutes as needed. For chest pain    [provider]  pantoprazole  (PROTONIX ) 40 MG tablet Take 1 tablet (40 mg total) by mouth daily. 12/27/23   Iva Marty Saltness, MD  phenylephrine (,USE FOR PREPARATION-H,) 0.25 % suppository Place rectally. 05/05/20   [provider]  polyethylene glycol (MIRALAX / GLYCOLAX) packet Take 17 g by mouth daily.    [provider]  polyethylene glycol powder (GLYCOLAX/MIRALAX) 17 GM/SCOOP powder Take by mouth. 01/07/21   [provider]  potassium chloride (K-DUR) 10 MEQ tablet  Take 10 mEq by mouth daily.    [provider]  potassium chloride (KLOR-CON) 10 MEQ tablet TAKE ONE TABLET BY MOUTH EVERY DAY FOR POTASSIUM REPLACEMENT 09/02/20   [provider]  rosuvastatin (CRESTOR) 40 MG tablet Take 40 mg by mouth daily.    [provider]  Skin Protectants, Misc. [facility]) OINT Apply topically.    [provider]  terbinafine (LAMISIL) 1 % cream Apply topically daily.     [provider]  triamcinolone cream (KENALOG) 0.5 % Apply 1 application topically as needed.     [provider]    Family History Family History  Problem Relation Age of Onset   Diabetes Sister        b/l below knee amputations   Stroke Brother    Heart attack Brother     Social History Social History   Tobacco Use   Smoking status: Never   Smokeless tobacco: Never  Vaping Use   Vaping status: Never Used  Substance Use Topics   Alcohol use: No    Alcohol/week: 0.0 standard drinks of alcohol   Drug use: No     Allergies   Patient has no  known allergies.   Review of Systems Review of Systems Per HPI  Physical Exam Triage Vital Signs ED Triage Vitals  Encounter Vitals Group     BP 01/03/24 0914 (!) 158/80     Girls Systolic BP Percentile --      Girls Diastolic BP Percentile --      Boys Systolic BP Percentile --      Boys Diastolic BP Percentile --      Pulse Rate 01/03/24 0914 88     Resp 01/03/24 0914 18     Temp 01/03/24 0920 97.9 F (36.6 C)     Temp Source 01/03/24 0914 Oral     SpO2 01/03/24 0914 98 %     Weight --      Height --      Head Circumference --      Peak Flow --      Pain Score 01/03/24 0920 6     Pain Loc --      Pain Education --      Exclude from Growth Chart --    No data found.  Updated Vital Signs BP (!) 158/80 (BP Location: Right Arm)   Pulse 88   Temp 97.9 F (36.6 C) (Oral)   Resp 18   SpO2 98%   Visual Acuity Right Eye Distance:   Left Eye Distance:   Bilateral Distance:    Right Eye Near:   Left Eye Near:    Bilateral Near:     Physical Exam Vitals and nursing note reviewed.  Constitutional:      General: He is not in acute distress.    Appearance: Normal appearance. He is not toxic-appearing.  HENT:     Head: Normocephalic and atraumatic.     Mouth/Throat:     Dentition: Abnormal dentition. Dental tenderness present.     Comments: Tenderness to gumline right lower jaw; no obvious abscess or redness Pulmonary:     Effort: Pulmonary effort is normal. No respiratory distress.   Skin:    General: Skin is warm and dry.     Capillary Refill: Capillary refill takes less than 2 seconds.     Coloration: Skin is not jaundiced or pale.     Findings: Rash present. No erythema.     Comments: Multiple, slightly raised,  slightly erythematous annular plaques noted to left forearm.  No active drainage or oozing.   Neurological:     Mental Status: He is alert and oriented to person, place, and time.   Psychiatric:        Behavior: Behavior is cooperative.       UC Treatments / Results  Labs (all labs ordered are listed, but only abnormal results are displayed) Labs Reviewed - No data to display  EKG   Radiology No results found.  Procedures Procedures (including critical care time)  Medications Ordered in UC Medications - No data to display  Initial Impression / Assessment and Plan / UC Course  I have reviewed the triage vital signs and the nursing notes.  Pertinent labs & imaging results that were available during my care of the patient were reviewed by me and considered in my medical decision making (see chart for details).   Patient is mildly hypertensive in triage today, otherwise vital signs are stable.  1. Dentalgia Treat with Augmentin  twice daily for 10 days Supportive care discussed with patient Follow-up with dentist closely  2. Rash and nonspecific skin eruption Stop diclofenac  gel Treat with triamcinolone ointment twice daily Recommended follow-up with primary care provider if symptoms do not improve with treatment  The patient was given the opportunity to ask questions.  All questions answered to their satisfaction.  The patient is in agreement to this plan.   Final Clinical Impressions(s) / UC Diagnoses   Final diagnoses:  Dentalgia  Rash and nonspecific skin eruption     Discharge Instructions      Use the Kenalog cream on the rash on your arm twice daily as needed to help with itching.  Follow up with your PCP if your symptoms do not improve with treatment.  Take the Augmentin  twice daily for 10 days to treat for dental infection.  Follow up with Dentist to have tooth pulled if needed.    ED Prescriptions     Medication Sig Dispense Auth. Provider   triamcinolone ointment (KENALOG) 0.1 % Apply 1 Application topically 2 (two) times daily. 15 g Chandra Raisin A, NP   amoxicillin -clavulanate (AUGMENTIN ) 875-125 MG tablet Take 1 tablet by mouth 2 (two) times daily for 7 days. 14 tablet  Chandra Raisin LABOR, NP      PDMP not reviewed this encounter.   Chandra Raisin LABOR, NP 01/03/24 1228

## 2024-01-03 NOTE — Discharge Instructions (Signed)
 Use the Kenalog cream on the rash on your arm twice daily as needed to help with itching.  Follow up with your PCP if your symptoms do not improve with treatment.  Take the Augmentin  twice daily for 10 days to treat for dental infection.  Follow up with Dentist to have tooth pulled if needed.

## 2024-01-03 NOTE — ED Triage Notes (Signed)
 Pt reports he has area of concern on his left forearm x 1 week. States he also has has right side wisdom tooth pain x 1 month

## 2024-02-20 ENCOUNTER — Encounter: Payer: Self-pay | Admitting: Emergency Medicine

## 2024-02-20 ENCOUNTER — Ambulatory Visit
Admission: EM | Admit: 2024-02-20 | Discharge: 2024-02-20 | Disposition: A | Discharge status: Home / Self Care | Attending: Nurse Practitioner | Admitting: Nurse Practitioner

## 2024-02-20 DIAGNOSIS — R21 Rash and other nonspecific skin eruption: Secondary | ICD-10-CM

## 2024-02-20 MED ORDER — TRIAMCINOLONE ACETONIDE 0.1 % EX CREA: 1.0000 | TOPICAL_CREAM | Freq: Two times a day (BID) | CUTANEOUS | 0 refills | Status: AC

## 2024-02-20 NOTE — ED Triage Notes (Signed)
 Rash on left arm since yesterday.  States rash itches.

## 2024-02-20 NOTE — Discharge Instructions (Addendum)
 Use the Kenalog  cream on the rash on your arm twice daily as needed to help with itching. Follow up with your PCP if your symptoms do not improve with treatment.

## 2024-02-20 NOTE — ED Provider Notes (Signed)
 [facility]    CSN: [id number] Arrival date & time: 02/20/24  0915      History   Chief Complaint No chief complaint on file.   HPI Samuel Wilkerson is a 89 y.o. male.   The history is provided by the patient.   Patient presents today for left arm rash. He reports the rash is little bit itchy and he has been applying diclofenac  gel without relief. He has been using diclofenac  gel on his left upper arm for torn bicep.  Patient states he has been using a triple antibiotic ointment to the rash on his arm with some relief of his symptoms.  He denies exposure to new soaps, medications, lotions, or detergents.  Further denies oozing or drainage from the rash. No fever or nausea/vomiting.  Patient has been treated for the same or similar symptoms in June.  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   Anxiety 01/08/2021   Atypical facial pain 01/08/2021   Bilateral pseudophakia 01/08/2021   Colonic polyp 01/08/2021   Encounter for fitting and adjustment of hearing aid 01/08/2021   Encounter for immunization 01/08/2021   Idiopathic urticaria 01/08/2021   Jaw pain 01/08/2021   Lens replaced by other means 01/08/2021   Facial swelling 01/08/2021   Low back pain, unspecified 01/08/2021   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   Onychomycosis due to dermatophyte 01/08/2021   Person with feared health complaint in whom no diagnosis is made 01/08/2021   Presbyopia 01/08/2021   Sensorineural hearing loss, bilateral 01/08/2021   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   Swelling of body region 01/08/2021   Xerosis cutis 01/08/2021   Clavi 12/04/2020   Soft tissue lesion 10/09/2020   Hyperlipidemia 11/26/2018   CAD (coronary artery disease), native coronary artery 11/26/2018    Hypertension 11/26/2018   Seasonal and perennial allergic rhinitis 11/07/2018   Secondary cataract 05/22/2018   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 Authorizing Provider  triamcinolone  cream (KENALOG ) 0.1 % Apply 1 Application topically 2 (two) times daily. 02/20/24  Yes Leath-Warren, Etta PARAS, NP  Acidophilus Lactobacillus CAPS Take 1 capsule by mouth 2 (two) times daily. 03/26/21   [provider]  albuterol  (PROVENTIL  HFA;VENTOLIN  HFA) 108 (90 Base) MCG/ACT inhaler Inhale 1-2 puffs into the lungs every 6 (six) hours as needed for wheezing or shortness of breath. 07/10/16   Ward, Josette SAILOR, DO  amLODipine (NORVASC) 10 MG tablet Take 5 mg by mouth daily.    [provider]  aspirin EC 81 MG tablet Take 81 mg by mouth daily.    [provider]  cetirizine  (ZYRTEC ) 10 MG tablet Take 1 tablet (10 mg total) by mouth daily. 07/27/22   Iva Marty Saltness, MD  chlordiazePOXIDE  (LIBRIUM ) 10 MG capsule Take 20 mg by mouth at bedtime.    [provider]  Cholecalciferol (VITAMIN D3) 25 MCG (1000 UT) CAPS Take 1,000 Units by mouth daily.     [provider]  clobetasol cream (TEMOVATE) 0.05 % Apply topically. 09/02/20   [provider]  diclofenac  Sodium (VOLTAREN ) 1 % GEL Apply 2 g topically 4 (four) times daily. 12/22/22   Haviland, Julie, MD  diphenhydrAMINE (BENADRYL) 25 mg capsule as needed. 11/03/20   [provider]  dorzolamide (TRUSOPT) 2 % ophthalmic solution Place 1 drop into both eyes 2 (two) times daily.    [provider]  dorzolamide (TRUSOPT) 2 % ophthalmic solution Apply to eye. 01/07/21   [provider]  fluticasone  (FLONASE ) 50 MCG/ACT nasal spray Place 1  spray into both nostrils daily. 12/27/23   Iva Marty Saltness, MD  guaiFENesin  200 MG tablet Take 200 mg by mouth 2 (two) times a day.    [provider]  hydrochlorothiazide (HYDRODIURIL) 50 MG tablet Take 50 mg by mouth daily.    [provider]  hydrocortisone-pramoxine [facility]) rectal foam Place 1 applicator rectally as needed for hemorrhoids or itching.    [provider]  hydrophilic ointment Apply topically.    [provider]  ipratropium (ATROVENT ) 0.03 % nasal spray Place 1 spray into both nostrils every morning. 12/27/23   Iva Marty Saltness, MD  latanoprost (XALATAN) 0.005 % ophthalmic solution Apply 1 drop to eye at bedtime. 09/02/20   [provider]  lisinopril (PRINIVIL,ZESTRIL) 20 MG tablet Take 10 mg by mouth daily.    [provider]  magic mouthwash w/lidocaine SOLN Take 10 mLs by mouth as directed. 03/31/21   [provider]  Menthol-Methyl Salicylate (THERA-GESIC) 0.5-15 % CREA Apply topically. 09/02/20   [provider]  methocarbamol (ROBAXIN) 500 MG tablet Take 500 mg by mouth 3 (three) times daily as needed. 04/22/22   [provider]  metoprolol (TOPROL-XL) 50 MG 24 hr tablet Take 50 mg by mouth 2 (two) times a day.    [provider]  metoprolol tartrate (LOPRESSOR) 50 MG tablet Take by mouth. 12/25/19   [provider]  miconazole  (MICOTIN) 2 % cream Apply 1 application topically 2 (two) times daily. 12/19/19   Galaway, Jennifer L, DPM  Miconazole  Nitrate 2 % KIT Apply topically.    [provider]  MISC NATURAL PRODUCTS EX Apply topically. Pain relieving cream - as needed for back    [provider]  montelukast (SINGULAIR) 10 MG tablet Take 1 tablet by mouth daily. 12/09/20   [provider]  naproxen (NAPROSYN) 375 MG tablet Take 375 mg by mouth 2 (two) times daily with a meal.    [provider]  nitroGLYCERIN (NITROSTAT) 0.4 MG  SL tablet Place 0.4 mg under the tongue every 5 (five) minutes as needed. For chest pain    [provider]  pantoprazole  (PROTONIX ) 40 MG tablet Take 1 tablet (40 mg total) by mouth daily. 12/27/23   Iva Marty Saltness, MD  phenylephrine (,USE FOR PREPARATION-H,) 0.25 % suppository Place rectally. 05/05/20   [provider]  polyethylene glycol (MIRALAX / GLYCOLAX) packet Take 17 g by mouth daily.    [provider]  polyethylene glycol powder (GLYCOLAX/MIRALAX) 17 GM/SCOOP powder Take by mouth. 01/07/21   [provider]  potassium chloride (K-DUR) 10 MEQ tablet Take 10 mEq by mouth daily.    [provider]  potassium chloride (KLOR-CON) 10 MEQ tablet TAKE ONE TABLET BY MOUTH EVERY DAY FOR POTASSIUM REPLACEMENT 09/02/20   [provider]  rosuvastatin (CRESTOR) 40 MG tablet Take 40 mg by mouth daily.    [provider]  Skin Protectants,  Misc. [facility]) OINT Apply topically.    [provider]  terbinafine (LAMISIL) 1 % cream Apply topically daily.     [provider]    Family History Family History  Problem Relation Age of Onset   Diabetes Sister        b/l below knee amputations   Stroke Brother    Heart attack Brother     Social History Social History   Tobacco Use   Smoking status: Never   Smokeless tobacco: Never  Vaping Use   Vaping status: Never Used  Substance Use Topics   Alcohol use: No    Alcohol/week: 0.0 standard drinks of alcohol   Drug use: No     Allergies   Patient has no known allergies.   Review of Systems Review of Systems Per HPI  Physical Exam Triage Vital Signs ED Triage Vitals  Encounter Vitals Group     BP 02/20/24 0918 (!) 153/72     Girls Systolic BP Percentile --      Girls Diastolic BP Percentile --      Boys Systolic BP Percentile --      Boys Diastolic BP Percentile --      Pulse Rate 02/20/24 0918 77     Resp 02/20/24 0918 18     Temp 02/20/24  0918 97.6 F (36.4 C)     Temp Source 02/20/24 0918 Oral     SpO2 02/20/24 0918 98 %     Weight --      Height --      Head Circumference --      Peak Flow --      Pain Score 02/20/24 0919 0     Pain Loc --      Pain Education --      Exclude from Growth Chart --    No data found.  Updated Vital Signs BP (!) 153/72 (BP Location: Right Arm)   Pulse 77   Temp 97.6 F (36.4 C) (Oral)   Resp 18   SpO2 98%   Visual Acuity Right Eye Distance:   Left Eye Distance:   Bilateral Distance:    Right Eye Near:   Left Eye Near:    Bilateral Near:     Physical Exam Vitals and nursing note reviewed.  Constitutional:      General: He is not in acute distress. HENT:     Head: Normocephalic.  Eyes:     Extraocular Movements: Extraocular movements intact.     Pupils: Pupils are equal, round, and reactive to light.  Pulmonary:     Effort: Pulmonary effort is normal.  Musculoskeletal:        General: Normal range of motion.     Cervical back: Normal range of motion.  Skin:    General: Skin is warm and dry.     Findings: Rash present.     Comments: Multiple, slightly raised, slightly erythematous annular plaques noted to left forearm extending up to the posterior left upper arm.  No active drainage or oozing.   Neurological:     General: No focal deficit present.     Mental Status: He is alert and oriented to person, place, and time.  Psychiatric:        Mood and Affect: Mood normal.        Behavior: Behavior normal.      UC Treatments / Results  Labs (all labs ordered are listed, but only abnormal results are displayed) Labs Reviewed - No  data to display  EKG   Radiology No results found.  Procedures Procedures (including critical care time)  Medications Ordered in UC Medications - No data to display  Initial Impression / Assessment and Plan / UC Course  I have reviewed the triage vital signs and the nursing notes.  Pertinent labs & imaging results that were  available during my care of the patient were reviewed by me and considered in my medical decision making (see chart for details).  Patient presents for complaints of rash has been present for the past day.  Per review of the chart, patient has been treated for the same or similar symptoms in June.  Will treat for rash and nonspecific skin eruption with triamcinolone  cream 0.1%.  Supportive care recommendations were provided discussed with the patient to include over-the-counter antihistamines, cool cloth to the affected area, and to avoid scratching or manipulating the areas while symptoms persist.  Patient was advised to follow-up with his PCP if symptoms continue to persist or worsen.  Patient was in agreement with this plan of care and verbalizes understanding.  All questions were answered.  Patient stable for discharge.  Final Clinical Impressions(s) / UC Diagnoses   Final diagnoses:  Rash and nonspecific skin eruption     Discharge Instructions      Use the Kenalog  cream on the rash on your arm twice daily as needed to help with itching. Follow up with your PCP if your symptoms do not improve with treatment.     ED Prescriptions     Medication Sig Dispense Auth. Provider   triamcinolone  cream (KENALOG ) 0.1 % Apply 1 Application topically 2 (two) times daily. 80 g Leath-Warren, Etta PARAS, NP      PDMP not reviewed this encounter.   Gilmer Etta PARAS, NP 02/20/24 [phone]

## 2024-04-03 ENCOUNTER — Ambulatory Visit
Admission: EM | Admit: 2024-04-03 | Discharge: 2024-04-03 | Disposition: A | Discharge status: Home / Self Care | Attending: Family Medicine | Admitting: Family Medicine

## 2024-04-03 DIAGNOSIS — J029 Acute pharyngitis, unspecified: Secondary | ICD-10-CM

## 2024-04-03 DIAGNOSIS — J3489 Other specified disorders of nose and nasal sinuses: Secondary | ICD-10-CM | POA: Diagnosis not present

## 2024-04-03 DIAGNOSIS — K0889 Other specified disorders of teeth and supporting structures: Secondary | ICD-10-CM

## 2024-04-03 MED ORDER — AMOXICILLIN-POT CLAVULANATE 875-125 MG PO TABS: 1.0000 | ORAL_TABLET | Freq: Two times a day (BID) | ORAL | 0 refills | Status: DC

## 2024-04-03 MED ORDER — FLUTICASONE PROPIONATE 50 MCG/ACT NA SUSP: 1.0000 | Freq: Every day | NASAL | 5 refills | Status: AC

## 2024-04-03 MED ORDER — IPRATROPIUM BROMIDE 0.03 % NA SOLN: 1.0000 | Freq: Every morning | NASAL | 5 refills | Status: AC

## 2024-04-03 NOTE — ED Triage Notes (Signed)
 Pt reports sore throat, right side back  lower toothache x 3 days

## 2024-04-03 NOTE — Discharge Instructions (Signed)
 I have sent in an antibiotic for possible dental infection and refilled your nasal sprays to help with sinus drainage that I believed to be contributing to your sore throat.  Follow-up for worsening or unresolving symptoms

## 2024-04-06 NOTE — ED Provider Notes (Signed)
 [facility]    CSN: [id number] Arrival date & time: 04/03/24  1157      History   Chief Complaint No chief complaint on file.   HPI Samuel Wilkerson is a 89 y.o. male.   Presenting today with 3 day history of sore throat, sinus drainage, right lower dental pain. Denies fever, chills, difficulty breathing or swallowing, cough. So far not trying anything OTC for sxs.     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   Anxiety 01/08/2021   Atypical facial pain 01/08/2021   Bilateral pseudophakia 01/08/2021   Colonic polyp 01/08/2021   Encounter for fitting and adjustment of hearing aid 01/08/2021   Encounter for immunization 01/08/2021   Idiopathic urticaria 01/08/2021   Jaw pain 01/08/2021   Lens replaced by other means 01/08/2021   Facial swelling 01/08/2021   Low back pain, unspecified 01/08/2021   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   Onychomycosis due to dermatophyte 01/08/2021   Person with feared health complaint in whom no diagnosis is made 01/08/2021   Presbyopia 01/08/2021   Sensorineural hearing loss, bilateral 01/08/2021   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   Swelling of body region 01/08/2021   Xerosis cutis 01/08/2021   Clavi 12/04/2020   Soft tissue lesion 10/09/2020   Hyperlipidemia 11/26/2018   CAD (coronary artery disease), native coronary artery 11/26/2018   Hypertension 11/26/2018   Seasonal and perennial allergic rhinitis 11/07/2018   Secondary cataract 05/22/2018   Primary open-angle glaucoma 12/26/2016    Past Surgical History:  Procedure Laterality Date   CARDIAC CATHETERIZATION     colonoscopy     CORONARY ANGIOPLASTY     TONSILLECTOMY         Home Medications    Prior to Admission medications   Medication Sig Start  Date End Date Taking? Authorizing Provider  amoxicillin -clavulanate (AUGMENTIN ) 875-125 MG tablet Take 1 tablet by mouth every 12 (twelve) hours. 04/03/24  Yes Stuart Vernell Norris, PA-C  Acidophilus Lactobacillus CAPS Take 1 capsule by mouth 2 (two) times daily. 03/26/21   [provider]  albuterol  (PROVENTIL  HFA;VENTOLIN  HFA) 108 (90 Base) MCG/ACT inhaler Inhale 1-2 puffs into the lungs every 6 (six) hours as needed for wheezing or shortness of breath. 07/10/16   Ward, Josette SAILOR, DO  amLODipine (NORVASC) 10 MG tablet Take 5 mg by mouth daily.    [provider]  aspirin EC 81 MG tablet Take 81 mg by mouth daily.    [provider]  cetirizine  (ZYRTEC ) 10 MG tablet Take 1 tablet (10 mg total) by mouth daily. 07/27/22   Iva Marty Saltness, MD  chlordiazePOXIDE  (LIBRIUM ) 10 MG capsule Take 20 mg by mouth at bedtime.    [provider]  Cholecalciferol (VITAMIN D3) 25 MCG (1000 UT) CAPS Take 1,000 Units by mouth daily.     [provider]  clobetasol cream (TEMOVATE) 0.05 % Apply topically. 09/02/20   [provider]  diclofenac  Sodium (VOLTAREN ) 1 % GEL Apply 2 g topically 4 (four) times daily. 12/22/22   Haviland, Julie, MD  diphenhydrAMINE (BENADRYL) 25 mg capsule as needed. 11/03/20   [provider]  dorzolamide (TRUSOPT) 2 % ophthalmic solution Place 1 drop into both eyes 2 (two) times daily.    [provider]  dorzolamide (TRUSOPT) 2 % ophthalmic solution Apply to eye. 01/07/21   [provider]  fluticasone  (FLONASE ) 50 MCG/ACT nasal spray Place 1 spray into both nostrils daily. 04/03/24   Stuart Vernell Norris, PA-C  guaiFENesin  200 MG tablet Take 200 mg by mouth 2 (two) times a day.    [provider]  hydrochlorothiazide (HYDRODIURIL) 50 MG tablet Take 50 mg by mouth daily.    [provider]  hydrocortisone-pramoxine [facility]) rectal foam Place 1 applicator rectally as needed for  hemorrhoids or itching.    [provider]  hydrophilic ointment Apply topically.    [provider]  ipratropium (ATROVENT ) 0.03 % nasal spray Place 1 spray into both nostrils every morning. 04/03/24   Stuart Vernell Norris, PA-C  latanoprost (XALATAN) 0.005 % ophthalmic solution Apply 1 drop to eye at bedtime. 09/02/20   [provider]  lisinopril (PRINIVIL,ZESTRIL) 20 MG tablet Take 10 mg by mouth daily.    [provider]  magic mouthwash w/lidocaine SOLN Take 10 mLs by mouth as directed. 03/31/21   [provider]  Menthol-Methyl Salicylate (THERA-GESIC) 0.5-15 % CREA Apply topically. 09/02/20   [provider]  methocarbamol (ROBAXIN) 500 MG tablet Take 500 mg by mouth 3 (three) times daily as needed. 04/22/22   [provider]  metoprolol (TOPROL-XL) 50 MG 24 hr tablet Take 50 mg by mouth 2 (two) times a day.    [provider]  metoprolol tartrate (LOPRESSOR) 50 MG tablet Take by mouth. 12/25/19   [provider]  miconazole  (MICOTIN) 2 % cream Apply 1 application topically 2 (two) times daily. 12/19/19   Galaway, Jennifer L, DPM  Miconazole  Nitrate 2 % KIT Apply topically.    [provider]  MISC NATURAL PRODUCTS EX Apply topically. Pain relieving cream - as needed for back    [provider]  montelukast (SINGULAIR) 10 MG tablet Take 1 tablet by mouth daily. 12/09/20   [provider]  naproxen (NAPROSYN) 375 MG tablet Take 375 mg by mouth 2 (two) times daily with a meal.    [provider]  nitroGLYCERIN (NITROSTAT) 0.4 MG SL tablet Place 0.4 mg under the tongue every 5 (five) minutes as needed. For chest pain    [provider]  pantoprazole  (PROTONIX ) 40 MG tablet Take 1 tablet (40 mg total) by mouth daily. 12/27/23   Iva Marty Saltness, MD  phenylephrine (,USE FOR PREPARATION-H,) 0.25 % suppository Place rectally. 05/05/20   [provider]  polyethylene  glycol (MIRALAX / GLYCOLAX) packet Take 17 g by mouth daily.    [provider]  polyethylene glycol powder (GLYCOLAX/MIRALAX) 17 GM/SCOOP powder Take by mouth. 01/07/21   [provider]  potassium chloride (K-DUR) 10 MEQ tablet Take 10 mEq by mouth daily.    [provider]  potassium chloride (KLOR-CON) 10 MEQ tablet TAKE ONE TABLET BY MOUTH EVERY DAY FOR POTASSIUM REPLACEMENT 09/02/20   [provider]  rosuvastatin (CRESTOR) 40 MG tablet Take 40 mg by mouth daily.    [provider]  Skin Protectants, Misc. [facility]) OINT Apply topically.    [provider]  terbinafine (LAMISIL) 1 % cream Apply topically daily.     [provider]  triamcinolone  cream (KENALOG ) 0.1 % Apply 1 Application topically 2 (two) times daily. 02/20/24   Leath-Warren, Etta PARAS, NP    Family History Family History  Problem Relation Age of Onset   Diabetes Sister  b/l below knee amputations   Stroke Brother    Heart attack Brother     Social History Social History   Tobacco Use   Smoking status: Never   Smokeless tobacco: Never  Vaping Use   Vaping status: Never Used  Substance Use Topics   Alcohol use: No    Alcohol/week: 0.0 standard drinks of alcohol   Drug use: No     Allergies   Patient has no known allergies.   Review of Systems Review of Systems PER HPI  Physical Exam Triage Vital Signs ED Triage Vitals  Encounter Vitals Group     BP 04/03/24 1205 (!) 147/78     Girls Systolic BP Percentile --      Girls Diastolic BP Percentile --      Boys Systolic BP Percentile --      Boys Diastolic BP Percentile --      Pulse Rate 04/03/24 1205 70     Resp 04/03/24 1205 16     Temp 04/03/24 1205 98.8 F (37.1 C)     Temp Source 04/03/24 1205 Oral     SpO2 04/03/24 1205 98 %     Weight --      Height --      Head Circumference --      Peak Flow --      Pain Score 04/03/24 1204 9     Pain Loc --      Pain Education  --      Exclude from Growth Chart --    No data found.  Updated Vital Signs BP (!) 147/78 (BP Location: Right Arm)   Pulse 70   Temp 98.8 F (37.1 C) (Oral)   Resp 16   SpO2 98%   Visual Acuity Right Eye Distance:   Left Eye Distance:   Bilateral Distance:    Right Eye Near:   Left Eye Near:    Bilateral Near:     Physical Exam Vitals and nursing note reviewed.  Constitutional:      Appearance: Normal appearance.  HENT:     Head: Atraumatic.     Mouth/Throat:     Mouth: Mucous membranes are moist.     Pharynx: Posterior oropharyngeal erythema present.     Comments: Gingival erythema and edema right posterior lower jaw Cobblestoning posterior oropharynx Eyes:     Extraocular Movements: Extraocular movements intact.     Conjunctiva/sclera: Conjunctivae normal.  Cardiovascular:     Rate and Rhythm: Normal rate.  Pulmonary:     Effort: Pulmonary effort is normal.  Musculoskeletal:        General: Normal range of motion.     Cervical back: Normal range of motion and neck supple.  Skin:    General: Skin is warm and dry.  Neurological:     Mental Status: He is oriented to person, place, and time.  Psychiatric:        Mood and Affect: Mood normal.        Thought Content: Thought content normal.        Judgment: Judgment normal.      UC Treatments / Results  Labs (all labs ordered are listed, but only abnormal results are displayed) Labs Reviewed - No data to display  EKG   Radiology No results found.  Procedures Procedures (including critical care time)  Medications Ordered in UC Medications - No data to display  Initial Impression / Assessment and Plan / UC Course  I have reviewed the triage  vital signs and the nursing notes.  Pertinent labs & imaging results that were available during my care of the patient were reviewed by me and considered in my medical decision making (see chart for details).     Treat with augmentin , tylenol, salt water  gargles and dental f/u for dental pain. Suspect sore throat related to sinus drainage. Treat with astelin nasal spray and flonase  which he needs refilled today, continued allergy regimen. PCP f/u recommended.   Final Clinical Impressions(s) / UC Diagnoses   Final diagnoses:  Pain, dental  Sore throat  Sinus drainage     Discharge Instructions      I have sent in an antibiotic for possible dental infection and refilled your nasal sprays to help with sinus drainage that I believed to be contributing to your sore throat.  Follow-up for worsening or unresolving symptoms    ED Prescriptions     Medication Sig Dispense Auth. Provider   amoxicillin -clavulanate (AUGMENTIN ) 875-125 MG tablet Take 1 tablet by mouth every 12 (twelve) hours. 14 tablet Stuart Vernell Norris, PA-C   fluticasone  (FLONASE ) 50 MCG/ACT nasal spray Place 1 spray into both nostrils daily. 16 g Stuart Vernell Norris, PA-C   ipratropium (ATROVENT ) 0.03 % nasal spray Place 1 spray into both nostrils every morning. 30 mL Stuart Vernell Norris, [place]      PDMP not reviewed this encounter.   Stuart Vernell Norris, PA-C 04/06/24 1357

## 2024-04-17 ENCOUNTER — Ambulatory Visit

## 2024-04-17 ENCOUNTER — Ambulatory Visit
Admission: EM | Admit: 2024-04-17 | Discharge: 2024-04-17 | Disposition: A | Discharge status: Home / Self Care | Attending: Family Medicine | Admitting: Family Medicine

## 2024-04-17 DIAGNOSIS — R051 Acute cough: Secondary | ICD-10-CM | POA: Diagnosis not present

## 2024-04-17 DIAGNOSIS — J0100 Acute maxillary sinusitis, unspecified: Secondary | ICD-10-CM

## 2024-04-17 LAB — POC COVID19/FLU A&B COMBO
Covid Antigen, POC: NEGATIVE
Influenza A Antigen, POC: NEGATIVE
Influenza B Antigen, POC: NEGATIVE

## 2024-04-17 LAB — POCT RAPID STREP A (OFFICE): Rapid Strep A Screen: NEGATIVE

## 2024-04-17 MED ORDER — BENZONATATE 100 MG PO CAPS: ORAL_CAPSULE | ORAL | 0 refills | Status: DC

## 2024-04-17 MED ORDER — AMOXICILLIN 875 MG PO TABS: 875.0000 mg | ORAL_TABLET | Freq: Two times a day (BID) | ORAL | 0 refills | Status: AC

## 2024-04-17 NOTE — ED Provider Notes (Signed)
 [facility]   [id number] 04/17/24 Arrival Time: 0919  ASSESSMENT & PLAN:  1. Acute cough   2. Acute non-recurrent maxillary sinusitis    Begin: Meds ordered this encounter  Medications   amoxicillin  (AMOXIL ) 875 MG tablet    Sig: Take 1 tablet (875 mg total) by mouth 2 (two) times daily for 7 days.    Dispense:  14 tablet    Refill:  0   benzonatate (TESSALON) 100 MG capsule    Sig: Take 1 capsule by mouth every 8 (eight) hours for cough.    Dispense:  21 capsule    Refill:  0    Discussed typical duration of symptoms. OTC symptom care as needed. Ensure adequate fluid intake and rest.   Follow-up Information     Nemiah Kemps, MD.   Specialty: Family Medicine Why: For follow up. Contact information: [address] [place] [address] [phone]         [facility] [place].   Specialty: Urgent Care Why: As needed. Contact information: [address], [address] [place]  [address] [phone]                Reviewed expectations re: course of current medical issues. Questions answered. Outlined signs and symptoms indicating need for more acute intervention. Patient verbalized understanding. After Visit Summary given.   SUBJECTIVE: History from: patient.  Samuel Wilkerson is a 89 y.o. male who presents with ST and sinus pressure; > 1 week; tolerating PO intake. Denies n/v/d. No tx PTA. With mild cough.   OBJECTIVE:  Vitals:   04/17/24 0933  BP: 109/64  Pulse: 94  Resp: 16  Temp: 100.1 F (37.8 C)  TempSrc: Oral  SpO2: 93%     General appearance: alert; no distress HEENT: nasal congestion; clear runny nose; throat irritation secondary to post-nasal drainage; bilateral maxillary tenderness to palpation; turbinates boggy Neck: supple without LAD; trachea midline Lungs: unlabored respirations, symmetrical air entry; cough: dry; no respiratory distress Skin: warm and  dry Psychological: alert and cooperative; normal mood and affect  No Known Allergies  Past Medical History:  Diagnosis Date   Coronary artery disease    Hyperlipidemia    Hypertension    Family History  Problem Relation Age of Onset   Diabetes Sister        b/l below knee amputations   Stroke Brother    Heart attack Brother    Social History   Socioeconomic History   Marital status: Widowed    Spouse name: Not on file   Number of children: 1   Years of education: Not on file   Highest education level: Not on file  Occupational History   Occupation: Retired  Tobacco Use   Smoking status: Never   Smokeless tobacco: Never  Vaping Use   Vaping status: Never Used  Substance and Sexual Activity   Alcohol use: No    Alcohol/week: 0.0 standard drinks of alcohol   Drug use: No   Sexual activity: Not on file  Other Topics Concern   Not on file  Social History Narrative   Not on file   Social Drivers of Health   Financial Resource Strain: Not on file  Food Insecurity: Low Risk  (10/07/2022)   Received from [facility]   Hunger Vital Sign    Within the past 12 months, you worried that your food would run out before you got money to buy more: Never true  Within the past 12 months, the food you bought just didn't last and you didn't have money to get more. : Never true  Transportation Needs: No Transportation Needs (10/07/2022)   Received from [facility]    In the past 12 months, has lack of reliable transportation kept you from medical appointments, meetings, work or from getting things needed for daily living? : No  Physical Activity: Not on file  Stress: Not on file  Social Connections: Not on file  Intimate Partner Violence: Not on file             Rolinda Rogue, MD 04/17/24 [phone]

## 2024-04-17 NOTE — ED Triage Notes (Signed)
 Pt reports sore throat x 3 weeks and cough started recently.

## 2024-06-28 ENCOUNTER — Ambulatory Visit: Admitting: Family Medicine

## 2024-06-28 ENCOUNTER — Ambulatory Visit: Admitting: Allergy & Immunology

## 2024-06-28 NOTE — Patient Instructions (Incomplete)
 Allergic rhinitis Continue allergen avoidance measures directed toward tree pollen, weed pollen, ragweed pollen, mold, dust mite, dog, and cockroach as listed below Continue Flonase 2 sprays in each nostril once a day as needed for stuffy nose Continue ipratropium 2 sprays in each nostril 2-3 times a day as needed for runny nose Consider saline nasal rinses as needed for nasal symptoms. Use this before any medicated nasal sprays for best result  Reflux Continue dietary lifestyle modifications as listed below Continue pantoprazole 40 mg once a day.  Take this medication 30 to 60 minutes before your first meal for best effect.  Angioedema/parotitis Continue to follow-up with your ENT specialist as recommended  Right lung nodule Continue follow-up with your pulmonologist as recommended  Call the clinic if this treatment plan is not working well for you.  Follow up in 6 months or sooner if needed.  Reducing Pollen Exposure The American Academy of Allergy, Asthma and Immunology suggests the following steps to reduce your exposure to pollen during allergy seasons. Do not hang sheets or clothing out to dry; pollen may collect on these items. Do not mow lawns or spend time around freshly cut grass; mowing stirs up pollen. Keep windows closed at night.  Keep car windows closed while driving. Minimize morning activities outdoors, a time when pollen counts are usually at their highest. Stay indoors as much as possible when pollen counts or humidity is high and on windy days when pollen tends to remain in the air longer. Use air conditioning when possible.  Many air conditioners have filters that trap the pollen spores. Use a HEPA room air filter to remove pollen form the indoor air you breathe.  Control of Mold Allergen Mold and fungi can grow on a variety of surfaces provided certain temperature and moisture conditions exist.  Outdoor molds grow on plants, decaying vegetation and soil.  The  major outdoor mold, Alternaria and Cladosporium, are found in very high numbers during hot and dry conditions.  Generally, a late Summer - Fall peak is seen for common outdoor fungal spores.  Rain will temporarily lower outdoor mold spore count, but counts rise rapidly when the rainy period ends.  The most important indoor molds are Aspergillus and Penicillium.  Dark, humid and poorly ventilated basements are ideal sites for mold growth.  The next most common sites of mold growth are the bathroom and the kitchen.  Outdoor [employer] Use air conditioning and keep windows closed Avoid exposure to decaying vegetation. Avoid leaf raking. Avoid grain handling. Consider wearing a face mask if working in moldy areas.  Indoor Mold Control Maintain humidity below 50%. Clean washable surfaces with 5% bleach solution. Remove sources e.g. Contaminated carpets.   Control of Dust Mite Allergen Dust mites play a major role in allergic asthma and rhinitis. They occur in environments with high humidity wherever human skin is found. Dust mites absorb humidity from the atmosphere (ie, they do not drink) and feed on organic matter (including shed human and animal skin). Dust mites are a microscopic type of insect that you cannot see with the naked eye. High levels of dust mites have been detected from mattresses, pillows, carpets, upholstered furniture, bed covers, clothes, soft toys and any woven material. The principal allergen of the dust mite is found in its feces. A gram of dust may contain 1,000 mites and 250,000 fecal particles. Mite antigen is easily measured in the air during house cleaning activities. Dust mites do not bite and do not cause  harm to humans, other than by triggering allergies/asthma.  Ways to decrease your exposure to dust mites in your home:  1. Encase mattresses, box springs and pillows with a mite-impermeable barrier or cover  2. Wash sheets, blankets and drapes weekly in hot water  (130 F) with detergent and dry them in a dryer on the hot setting.  3. Have the room cleaned frequently with a vacuum cleaner and a damp dust-mop. For carpeting or rugs, vacuuming with a vacuum cleaner equipped with a high-efficiency particulate air (HEPA) filter. The dust mite allergic individual should not be in a room which is being cleaned and should wait 1 hour after cleaning before going into the room.  4. Do not sleep on upholstered furniture (eg, couches).  5. If possible removing carpeting, upholstered furniture and drapery from the home is ideal. Horizontal blinds should be eliminated in the rooms where the person spends the most time (bedroom, study, television room). Washable vinyl, roller-type shades are optimal.  6. Remove all non-washable stuffed toys from the bedroom. Wash stuffed toys weekly like sheets and blankets above.  7. Reduce indoor humidity to less than 50%. Inexpensive humidity monitors can be purchased at most hardware stores. Do not use a humidifier as can make the problem worse and are not recommended.  Control of Dog or Cat Allergen Avoidance is the best way to manage a dog or cat allergy. If you have a dog or cat and are allergic to dog or cats, consider removing the dog or cat from the home. If you have a dog or cat but don't want to find it a new home, or if your family wants a pet even though someone in the household is allergic, here are some strategies that may help keep symptoms at bay:  Keep the pet out of your bedroom and restrict it to only a few rooms. Be advised that keeping the dog or cat in only one room will not limit the allergens to that room. Don't pet, hug or kiss the dog or cat; if you do, wash your hands with soap and water. High-efficiency particulate air (HEPA) cleaners run continuously in a bedroom or living room can reduce allergen levels over time. Regular use of a high-efficiency vacuum cleaner or a central vacuum can reduce allergen  levels. Giving your dog or cat a bath at least once a week can reduce airborne allergen.  Control of Cockroach Allergen Cockroach allergen has been identified as an important cause of acute attacks of asthma, especially in urban settings.  There are fifty-five species of cockroach that exist in the [place], however only three, the [place], [place] species produce allergen that can affect patients with Asthma.  Allergens can be obtained from fecal particles, egg casings and secretions from cockroaches.    Remove food sources. Reduce access to water. Seal access and entry points. Spray runways with 0.5-1% Diazinon or Chlorpyrifos Blow boric acid power under stoves and refrigerator. Place bait stations (hydramethylnon) at feeding sites.

## 2024-06-28 NOTE — Progress Notes (Deleted)
" ° °  [address] [place]  [place] [address] Dept: [phone]  FOLLOW UP NOTE  Patient ID: Samuel Wilkerson, male    DOB: 1931/05/10  Age: 89 y.o. MRN: [id number] Date of Office Visit: 06/28/2024  Assessment  Chief Complaint: No chief complaint on file.  HPI Samuel Wilkerson is a 89 year old male who presents to the clinic for follow-up visit.  He was last seen in this clinic on 12/27/2023 by Dr. Iva for evaluation of allergic rhinitis, reflux, facial swelling, and lung nodule.  Discussed the use of AI scribe software for clinical note transcription with the patient, who gave verbal consent to proceed.  History of Present Illness      Drug Allergies:  Allergies[1]  Physical Exam: There were no vitals taken for this visit.   Physical Exam  Diagnostics:    Assessment and Plan: No diagnosis found.  No orders of the defined types were placed in this encounter.   There are no Patient Instructions on file for this visit.  No follow-ups on file.    Thank you for the opportunity to care for this patient.  Please do not hesitate to contact me with questions.  Arlean Mutter, FNP [facility]          [1] No Known Allergies  "

## 2024-08-02 ENCOUNTER — Encounter: Payer: Self-pay | Admitting: Emergency Medicine

## 2024-08-02 ENCOUNTER — Ambulatory Visit
Admission: EM | Admit: 2024-08-02 | Discharge: 2024-08-02 | Disposition: A | Discharge status: Home / Self Care | Attending: Physician Assistant | Admitting: Physician Assistant

## 2024-08-02 DIAGNOSIS — J029 Acute pharyngitis, unspecified: Secondary | ICD-10-CM

## 2024-08-02 DIAGNOSIS — M273 Alveolitis of jaws: Secondary | ICD-10-CM

## 2024-08-02 MED ORDER — AMOXICILLIN 500 MG PO CAPS: 500.0000 mg | ORAL_CAPSULE | Freq: Three times a day (TID) | ORAL | 0 refills | Status: AC

## 2024-08-02 NOTE — Discharge Instructions (Addendum)
 Return if any problems.

## 2024-08-02 NOTE — ED Provider Notes (Signed)
 " [facility]    CSN: [id number] Arrival date & time: 08/02/24  1448      History   Chief Complaint No chief complaint on file.   HPI Samuel Wilkerson is a 89 y.o. male.   Patient complains of pain in his right lower mouth after having a tooth extracted last month.  Patient reports that there is a hole in the area that is painful.  Patient also reports that his throat hurts on the right side.  Patient reports he has not had any fever or chills.  Patient denies any nausea or vomiting.  The history is provided by the patient. No language interpreter was used.    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   Anxiety 01/08/2021   Atypical facial pain 01/08/2021   Bilateral pseudophakia 01/08/2021   Colonic polyp 01/08/2021   Encounter for fitting and adjustment of hearing aid 01/08/2021   Encounter for immunization 01/08/2021   Idiopathic urticaria 01/08/2021   Jaw pain 01/08/2021   Lens replaced by other means 01/08/2021   Facial swelling 01/08/2021   Low back pain, unspecified 01/08/2021   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   Onychomycosis due to dermatophyte 01/08/2021   Person with feared health complaint in whom no diagnosis is made 01/08/2021   Presbyopia 01/08/2021   Sensorineural hearing loss, bilateral 01/08/2021   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   Swelling of body region 01/08/2021   Xerosis cutis 01/08/2021   Clavi 12/04/2020   Soft tissue lesion 10/09/2020   Hyperlipidemia 11/26/2018   CAD (coronary artery disease), native coronary artery 11/26/2018   Hypertension 11/26/2018   Seasonal and perennial allergic rhinitis 11/07/2018   Secondary cataract 05/22/2018   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 Authorizing Provider  amoxicillin  (AMOXIL ) 500 MG capsule Take 1 capsule (500 mg total) by mouth 3 (three) times daily. 08/02/24  Yes Idris Edmundson K, PA-C  Acidophilus Lactobacillus CAPS Take 1 capsule by mouth 2 (two) times daily. 03/26/21   [provider]  albuterol  (PROVENTIL  HFA;VENTOLIN  HFA) 108 (90 Base) MCG/ACT inhaler Inhale 1-2 puffs into the lungs every 6 (six) hours as needed for wheezing or shortness of breath. 07/10/16   Ward, Josette SAILOR, DO  amLODipine (NORVASC) 10 MG tablet Take 5 mg by mouth daily.    [provider]  aspirin EC 81 MG tablet Take 81 mg by mouth daily.    [provider]  cetirizine  (ZYRTEC ) 10 MG tablet Take 1 tablet (10 mg total) by mouth daily. 07/27/22   Iva Marty Saltness, MD  chlordiazePOXIDE  (LIBRIUM ) 10 MG capsule Take 20 mg by mouth at bedtime.    [provider]  Cholecalciferol (VITAMIN D3) 25 MCG (1000 UT) CAPS Take 1,000 Units by mouth daily.     [provider]  clobetasol cream (TEMOVATE) 0.05 % Apply topically. 09/02/20   [provider]  diclofenac  Sodium (VOLTAREN ) 1 % GEL Apply 2 g topically 4 (four) times daily.  12/22/22   Haviland, Julie, MD  diphenhydrAMINE (BENADRYL) 25 mg capsule as needed. 11/03/20   [provider]  dorzolamide (TRUSOPT) 2 % ophthalmic solution Place 1 drop into both eyes 2 (two) times daily.    [provider]  dorzolamide (TRUSOPT) 2 % ophthalmic solution Apply to eye. 01/07/21   [provider]  fluticasone  (FLONASE ) 50 MCG/ACT nasal spray Place 1 spray into both nostrils daily. 04/03/24   Stuart Vernell Norris, PA-C  guaiFENesin  200 MG tablet Take 200 mg by mouth 2 (two) times a day.    [provider]  hydrochlorothiazide  (HYDRODIURIL) 50 MG tablet Take 50 mg by mouth daily.    [provider]  hydrocortisone-pramoxine [facility]) rectal foam Place 1 applicator rectally as needed for hemorrhoids or itching.    [provider]  hydrophilic ointment Apply topically.    [provider]  ipratropium (ATROVENT ) 0.03 % nasal spray Place 1 spray into both nostrils every morning. 04/03/24   Stuart Vernell Norris, PA-C  latanoprost (XALATAN) 0.005 % ophthalmic solution Apply 1 drop to eye at bedtime. 09/02/20   [provider]  lisinopril (PRINIVIL,ZESTRIL) 20 MG tablet Take 10 mg by mouth daily.    [provider]  Menthol-Methyl Salicylate (THERA-GESIC) 0.5-15 % CREA Apply topically. 09/02/20   [provider]  methocarbamol (ROBAXIN) 500 MG tablet Take 500 mg by mouth 3 (three) times daily as needed. 04/22/22   [provider]  metoprolol (TOPROL-XL) 50 MG 24 hr tablet Take 50 mg by mouth 2 (two) times a day.    [provider]  metoprolol tartrate (LOPRESSOR) 50 MG tablet Take by mouth. 12/25/19   [provider]  miconazole  (MICOTIN) 2 % cream Apply 1 application topically 2 (two) times daily. 12/19/19   Galaway, Jennifer L, DPM  Miconazole  Nitrate 2 % KIT Apply topically.    [provider]  MISC NATURAL PRODUCTS EX Apply topically. Pain relieving cream - as needed for back    [provider]  montelukast (SINGULAIR) 10 MG tablet Take 1 tablet by mouth daily. 12/09/20   [provider]  naproxen (NAPROSYN) 375 MG tablet Take 375 mg by mouth 2 (two) times daily with a meal.    [provider]  nitroGLYCERIN (NITROSTAT) 0.4 MG SL tablet Place 0.4 mg under the tongue every 5 (five) minutes as needed. For chest pain    [provider]  pantoprazole  (PROTONIX ) 40 MG tablet Take 1 tablet (40 mg total) by mouth daily. 12/27/23   Iva Marty Saltness, MD  phenylephrine (,USE FOR PREPARATION-H,) 0.25 %  suppository Place rectally. 05/05/20   [provider]  polyethylene glycol (MIRALAX / GLYCOLAX) packet Take 17 g by mouth daily.    [provider]  polyethylene glycol powder (GLYCOLAX/MIRALAX) 17 GM/SCOOP powder Take by mouth. 01/07/21   [provider]  potassium chloride (K-DUR) 10 MEQ tablet Take 10 mEq by mouth daily.    [provider]  potassium chloride (KLOR-CON) 10 MEQ tablet TAKE ONE TABLET BY MOUTH EVERY DAY FOR POTASSIUM REPLACEMENT 09/02/20   [provider]  rosuvastatin (CRESTOR) 40 MG tablet Take 40 mg by mouth daily.    [provider]  Skin Protectants, Misc. [facility]) OINT Apply topically.    [provider]  terbinafine (LAMISIL) 1 % cream Apply topically daily.     [provider]  triamcinolone  cream (KENALOG ) 0.1 % Apply 1 Application topically 2 (two) times daily. 02/20/24   Leath-Warren, Etta  J, NP    Family History Family History  Problem Relation Age of Onset   Diabetes Sister        b/l below knee amputations   Stroke Brother    Heart attack Brother     Social History Social History[1]   Allergies   Patient has no known allergies.   Review of Systems Review of Systems  All other systems reviewed and are negative.    Physical Exam Triage Vital Signs ED Triage Vitals  Encounter Vitals Group     BP 08/02/24 1500 (!) 160/79     Girls Systolic BP Percentile --      Girls Diastolic BP Percentile --      Boys Systolic BP Percentile --      Boys Diastolic BP Percentile --      Pulse Rate 08/02/24 1500 80     Resp 08/02/24 1500 18     Temp 08/02/24 1500 97.6 F (36.4 C)     Temp Source 08/02/24 1500 Oral     SpO2 08/02/24 1500 99 %     Weight --      Height --      Head Circumference --      Peak Flow --      Pain Score 08/02/24 1505 3     Pain Loc --      Pain Education --      Exclude from Growth Chart --    No data found.  Updated Vital Signs BP (!) 160/79  (BP Location: Right Arm)   Pulse 80   Temp 97.6 F (36.4 C) (Oral)   Resp 18   SpO2 99%   Visual Acuity Right Eye Distance:   Left Eye Distance:   Bilateral Distance:    Right Eye Near:   Left Eye Near:    Bilateral Near:     Physical Exam Vitals and nursing note reviewed.  Constitutional:      Appearance: He is well-developed.  HENT:     Head: Normocephalic.     Nose: Nose normal.     Mouth/Throat:     Mouth: Mucous membranes are moist.     Comments: Extraction site right lower molar, there is a deep area in the gum, no erythema no edema. Cardiovascular:     Rate and Rhythm: Normal rate.  Pulmonary:     Effort: Pulmonary effort is normal.  Abdominal:     General: There is no distension.  Musculoskeletal:        General: Normal range of motion.     Cervical back: Normal range of motion.  Skin:    General: Skin is warm.  Neurological:     General: No focal deficit present.     Mental Status: He is alert and oriented to person, place, and time.      UC Treatments / Results  Labs (all labs ordered are listed, but only abnormal results are displayed) Labs Reviewed - No data to display  EKG   Radiology No results found.  Procedures Procedures (including critical care time)  Medications Ordered in UC Medications - No data to display  Initial Impression / Assessment and Plan / UC Course  I have reviewed the triage vital signs and the nursing notes.  Pertinent labs & imaging results that were available during my care of the patient were reviewed by me and considered in my medical decision making (see chart for details).     Patient is having pain at dental  extraction site as well as a sore throat.  Patient is started on amoxicillin  he is advised to follow-up with his primary care physician and his dentist for recheck.  Patient is discharged in stable condition Final Clinical Impressions(s) / UC Diagnoses   Final diagnoses:  Dry tooth socket  Acute  pharyngitis, unspecified etiology     Discharge Instructions      Return if any problems.    ED Prescriptions     Medication Sig Dispense Auth. Provider   amoxicillin  (AMOXIL ) 500 MG capsule Take 1 capsule (500 mg total) by mouth 3 (three) times daily. 30 capsule Genevieve Arbaugh K, PA-C      PDMP not reviewed this encounter. An After Visit Summary was printed and given to the patient.        [1]  Social History Tobacco Use   Smoking status: Never   Smokeless tobacco: Never  Vaping Use   Vaping status: Never Used  Substance Use Topics   Alcohol use: No    Alcohol/week: 0.0 standard drinks of alcohol   Drug use: No     Flint Sonny POUR, PA-C 08/02/24 1607  "

## 2024-08-02 NOTE — ED Triage Notes (Signed)
 Had tooth removed on right lower side on 1/15.  States has a hole in that area and the area hurts.  States his throat has been hurting since before he had the tooth pulled
